# Patient Record
Sex: Female | Born: 1970 | Race: Black or African American | Hispanic: No | Marital: Single | Smoking: Current every day smoker
Health system: Southern US, Community
[De-identification: ages and names within clinical notes are randomized; demographics above are authoritative.]

## PROBLEM LIST (undated history)

## (undated) DIAGNOSIS — F1410 Cocaine abuse, uncomplicated: Secondary | ICD-10-CM

## (undated) DIAGNOSIS — E039 Hypothyroidism, unspecified: Secondary | ICD-10-CM

## (undated) DIAGNOSIS — F32A Depression, unspecified: Secondary | ICD-10-CM

## (undated) DIAGNOSIS — D509 Iron deficiency anemia, unspecified: Secondary | ICD-10-CM

## (undated) DIAGNOSIS — I10 Essential (primary) hypertension: Secondary | ICD-10-CM

## (undated) DIAGNOSIS — F329 Major depressive disorder, single episode, unspecified: Secondary | ICD-10-CM

## (undated) HISTORY — PX: TUBAL LIGATION: SHX77

---
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
## 2000-09-01 ENCOUNTER — Ambulatory Visit (HOSPITAL_COMMUNITY): Admission: RE | Admit: 2000-09-01 | Discharge: 2000-09-01 | Payer: Self-pay | Admitting: *Deleted

## 2000-09-03 ENCOUNTER — Encounter: Admission: RE | Admit: 2000-09-03 | Discharge: 2000-09-03 | Payer: Self-pay | Admitting: Obstetrics & Gynecology

## 2000-09-24 ENCOUNTER — Encounter: Admission: RE | Admit: 2000-09-24 | Discharge: 2000-09-24 | Payer: Self-pay | Admitting: Obstetrics & Gynecology

## 2000-10-10 ENCOUNTER — Inpatient Hospital Stay (HOSPITAL_COMMUNITY): Admission: AD | Admit: 2000-10-10 | Discharge: 2000-10-10 | Payer: Self-pay | Admitting: Obstetrics & Gynecology

## 2000-10-15 ENCOUNTER — Encounter: Admission: RE | Admit: 2000-10-15 | Discharge: 2000-10-15 | Payer: Self-pay | Admitting: Obstetrics & Gynecology

## 2000-10-22 ENCOUNTER — Ambulatory Visit (HOSPITAL_COMMUNITY): Admission: RE | Admit: 2000-10-22 | Discharge: 2000-10-22 | Payer: Self-pay | Admitting: Obstetrics & Gynecology

## 2000-10-29 ENCOUNTER — Encounter: Admission: RE | Admit: 2000-10-29 | Discharge: 2000-10-29 | Payer: Self-pay | Admitting: Obstetrics & Gynecology

## 2000-11-05 ENCOUNTER — Encounter: Admission: RE | Admit: 2000-11-05 | Discharge: 2000-11-05 | Payer: Self-pay | Admitting: Obstetrics & Gynecology

## 2000-11-05 ENCOUNTER — Encounter (HOSPITAL_COMMUNITY): Admission: RE | Admit: 2000-11-05 | Discharge: 2000-12-19 | Payer: Self-pay | Admitting: Obstetrics & Gynecology

## 2000-11-12 ENCOUNTER — Encounter: Admission: RE | Admit: 2000-11-12 | Discharge: 2000-11-12 | Payer: Self-pay | Admitting: Obstetrics & Gynecology

## 2000-11-19 ENCOUNTER — Encounter: Admission: RE | Admit: 2000-11-19 | Discharge: 2000-11-19 | Payer: Self-pay | Admitting: Obstetrics & Gynecology

## 2000-11-26 ENCOUNTER — Encounter: Admission: RE | Admit: 2000-11-26 | Discharge: 2000-11-26 | Payer: Self-pay | Admitting: Obstetrics & Gynecology

## 2000-12-03 ENCOUNTER — Encounter: Admission: RE | Admit: 2000-12-03 | Discharge: 2000-12-03 | Payer: Self-pay | Admitting: Obstetrics & Gynecology

## 2000-12-10 ENCOUNTER — Encounter: Admission: RE | Admit: 2000-12-10 | Discharge: 2000-12-10 | Payer: Self-pay | Admitting: Obstetrics & Gynecology

## 2000-12-17 ENCOUNTER — Encounter: Admission: RE | Admit: 2000-12-17 | Discharge: 2000-12-17 | Payer: Self-pay | Admitting: Obstetrics & Gynecology

## 2000-12-17 ENCOUNTER — Inpatient Hospital Stay (HOSPITAL_COMMUNITY): Admission: AD | Admit: 2000-12-17 | Discharge: 2000-12-20 | Payer: Self-pay | Admitting: *Deleted

## 2003-02-28 ENCOUNTER — Ambulatory Visit (HOSPITAL_COMMUNITY): Admission: RE | Admit: 2003-02-28 | Discharge: 2003-02-28 | Payer: Self-pay | Admitting: Obstetrics and Gynecology

## 2003-08-08 ENCOUNTER — Emergency Department (HOSPITAL_COMMUNITY): Admission: EM | Admit: 2003-08-08 | Discharge: 2003-08-09 | Payer: Self-pay

## 2005-02-01 ENCOUNTER — Emergency Department (HOSPITAL_COMMUNITY): Admission: EM | Admit: 2005-02-01 | Discharge: 2005-02-01 | Payer: Self-pay | Admitting: Emergency Medicine

## 2005-02-04 ENCOUNTER — Ambulatory Visit (HOSPITAL_COMMUNITY): Admission: RE | Admit: 2005-02-04 | Discharge: 2005-02-04 | Payer: Self-pay | Admitting: Emergency Medicine

## 2005-04-15 ENCOUNTER — Emergency Department (HOSPITAL_COMMUNITY): Admission: EM | Admit: 2005-04-15 | Discharge: 2005-04-15 | Payer: Self-pay | Admitting: Emergency Medicine

## 2005-09-12 ENCOUNTER — Emergency Department (HOSPITAL_COMMUNITY): Admission: EM | Admit: 2005-09-12 | Discharge: 2005-09-13 | Payer: Self-pay | Admitting: Emergency Medicine

## 2006-02-09 ENCOUNTER — Emergency Department (HOSPITAL_COMMUNITY): Admission: EM | Admit: 2006-02-09 | Discharge: 2006-02-09 | Payer: Self-pay | Admitting: Emergency Medicine

## 2006-02-25 ENCOUNTER — Emergency Department (HOSPITAL_COMMUNITY): Admission: EM | Admit: 2006-02-25 | Discharge: 2006-02-25 | Payer: Self-pay | Admitting: Emergency Medicine

## 2006-04-10 IMAGING — US US TRANSVAGINAL NON-OB
1 series · 14 of 25 positions shown · non-contrast
Comparison: none

CLINICAL DATA: The patient has abdominal pain.
 ULTRASOUND OF THE PELVIS - TRANSABDOMINAL AND TRANSVAGINAL:
 The transabdominal study reveals the uterus to measure 9.9 x 5.3 x 6.0 cm.  
 The transvaginal study reveals a fluid-filled endometrial canal with the endometrial stripe measuring 2 cm which is enlarged.  The right ovary measures 4.0 x 1.8 x 2.0 cm with follicles.  The left ovary measures 4.0 x 2.5 x 2.9 cm with a 1.4 x 1.5 x 1.8 cm complex cyst of the left ovary.  There is a small amount of free fluid.

[Series 1: unknown · 0.32mm/px · 14 of 67 slices shown]
[im 1/67]
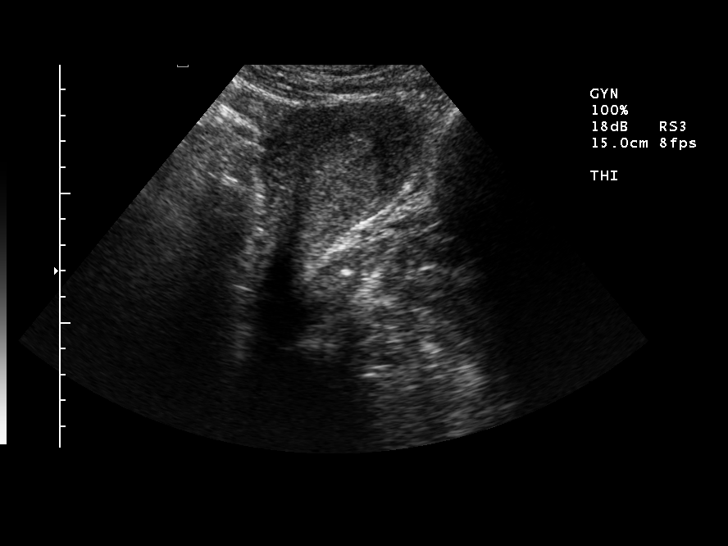
[im 6/67]
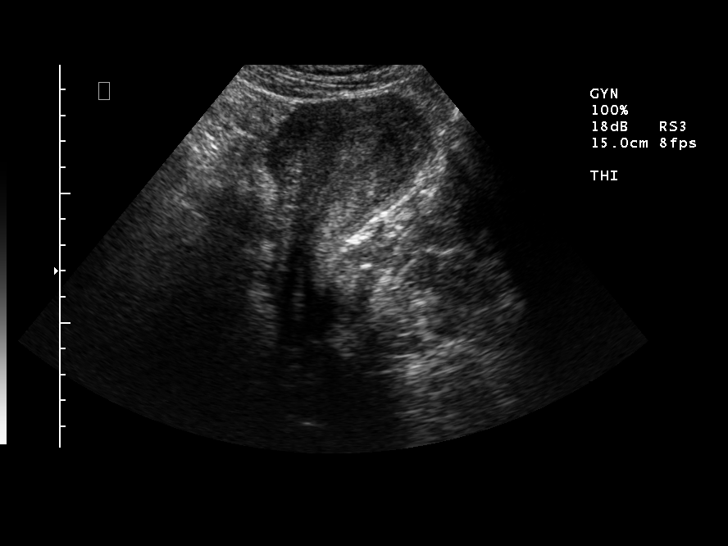
[im 12/67]
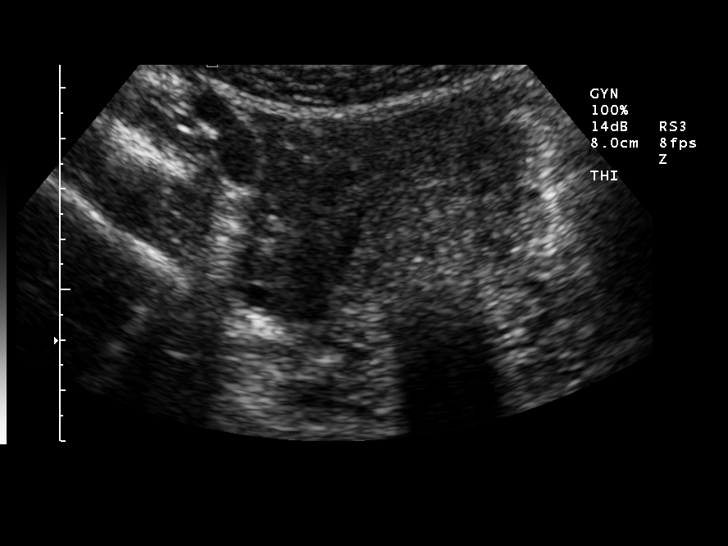
[im 17/67]
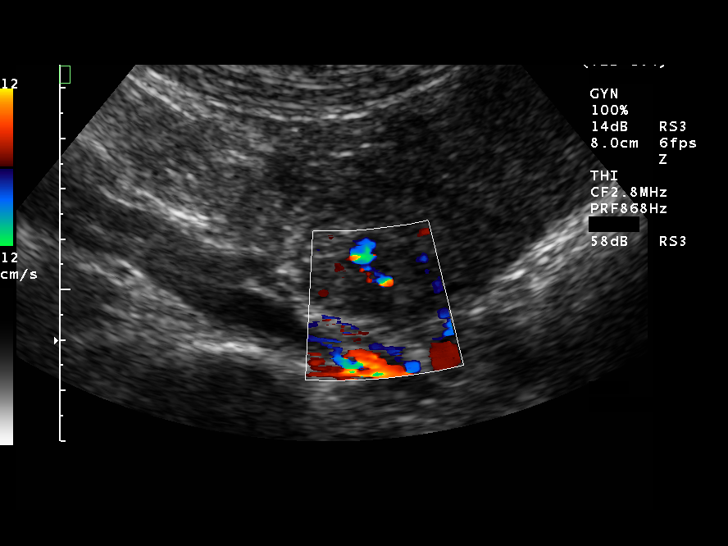
[im 23/67]
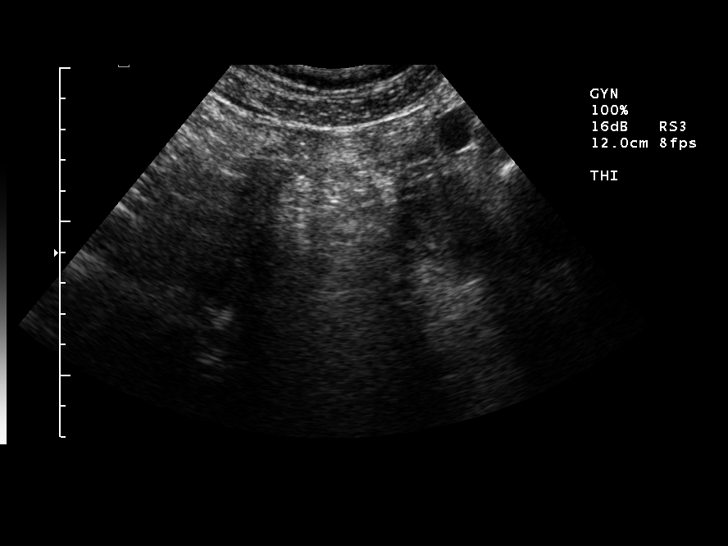
[im 25/67]
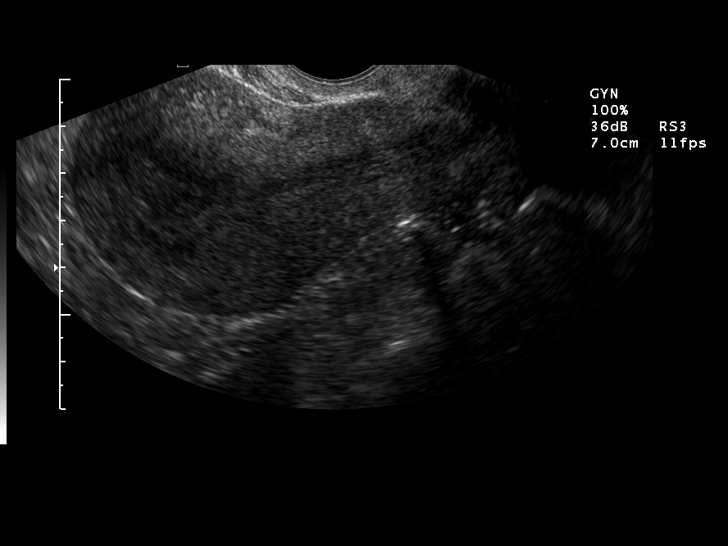
[im 31/67]
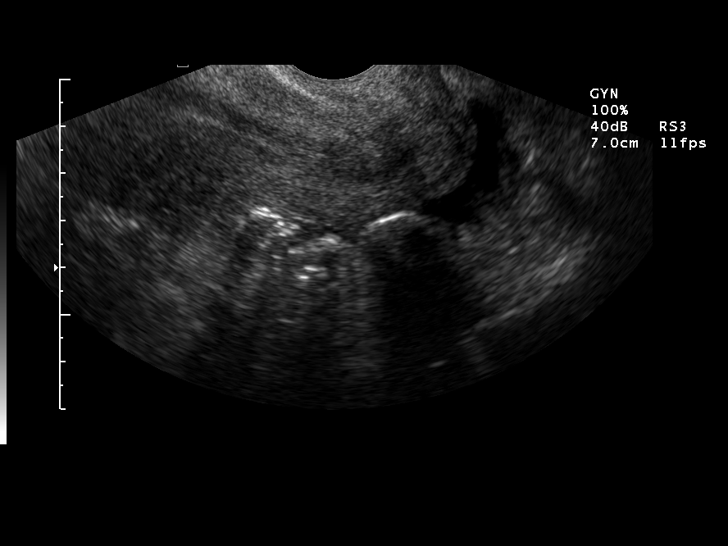
[im 36/67]
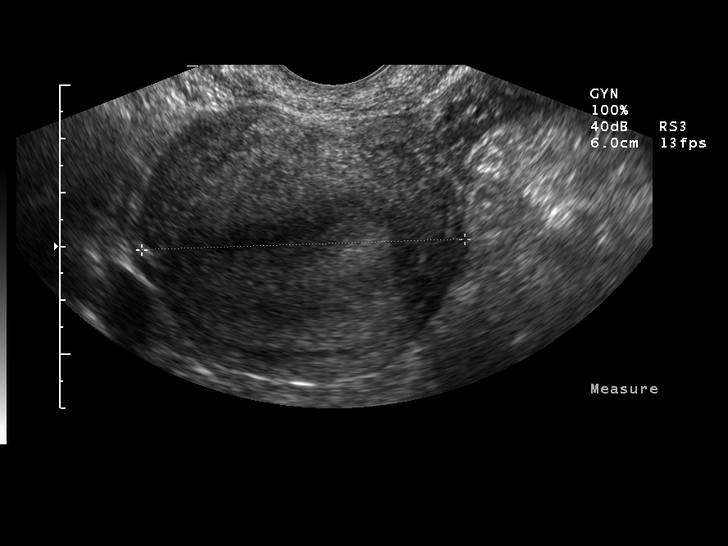
[im 42/67]
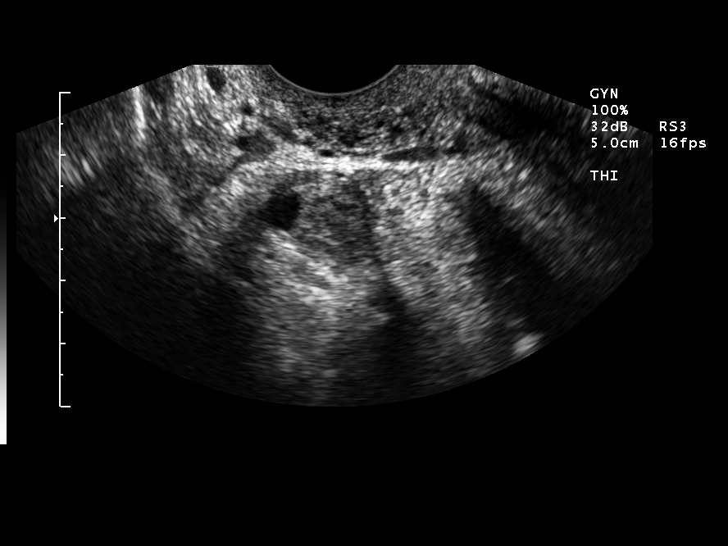
[im 45/67]
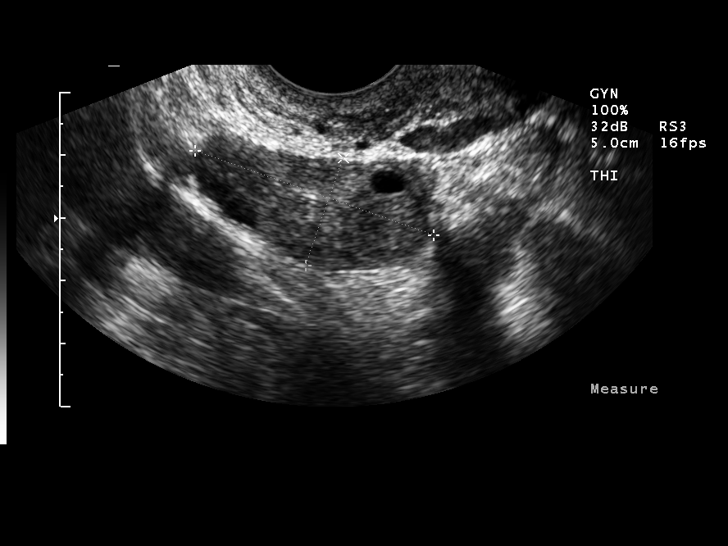
[im 50/67]
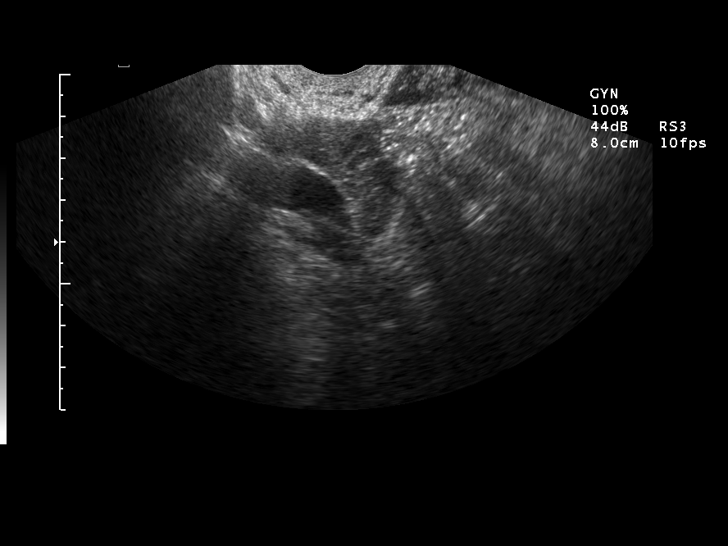
[im 56/67]
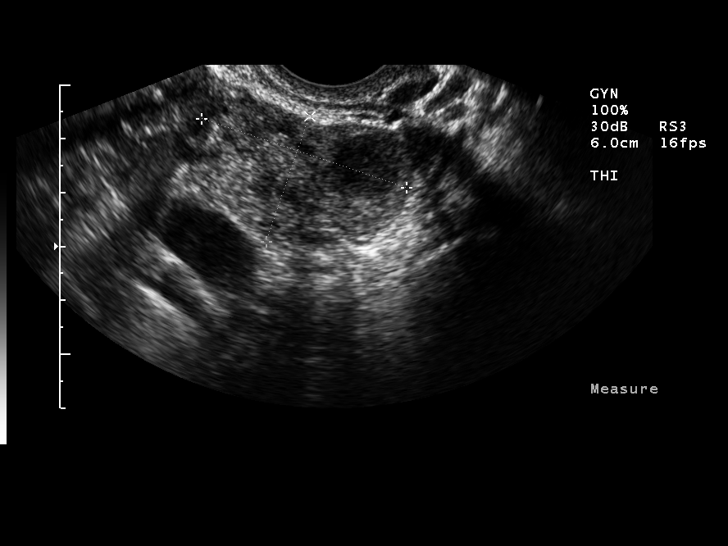
[im 61/67]
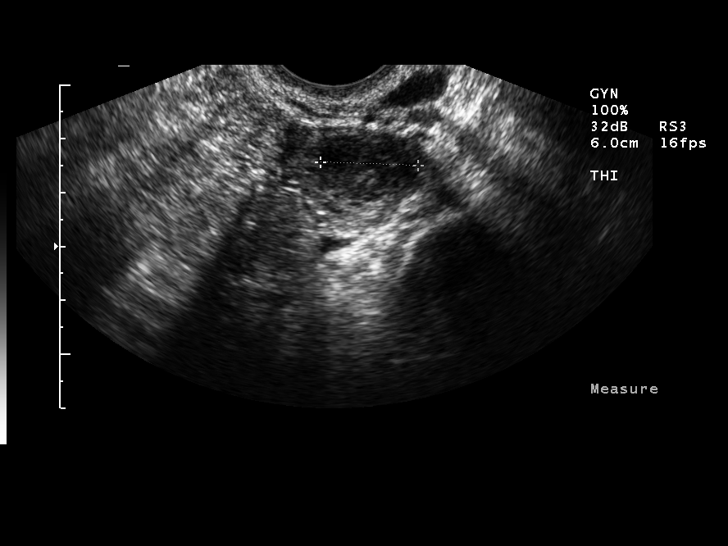
[im 67/67]
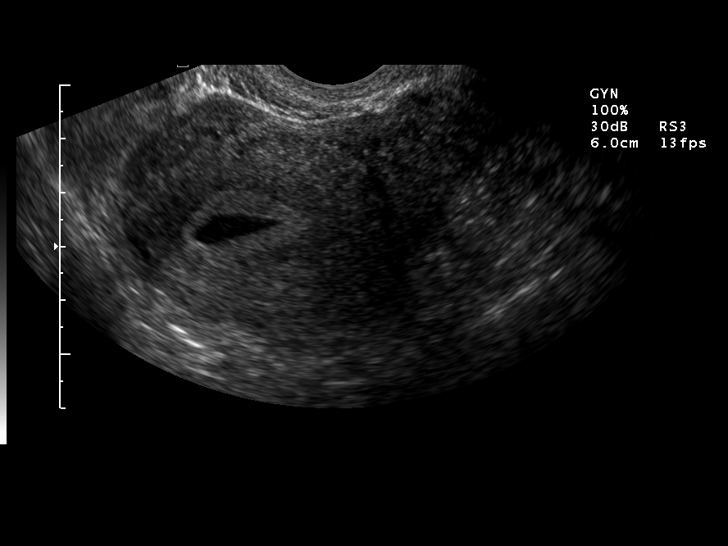

[14 of 25 positions shown; findings below may reference images not displayed]

IMPRESSION: There is a fluid-filled endometrial canal with prominence of the endometrial lining.  
 There is a 1.8 cm complex cyst of the left ovary which could represent an involuted cyst with a small amount of free fluid.

## 2007-05-01 IMAGING — CR DG CHEST 2V
2 series · 2 of 2 positions shown · non-contrast
Comparison: None.

CLINICAL DATA: Medical clearance.  Abdominal pain.
 CHEST - 2 VIEW:

[w chest pa *]
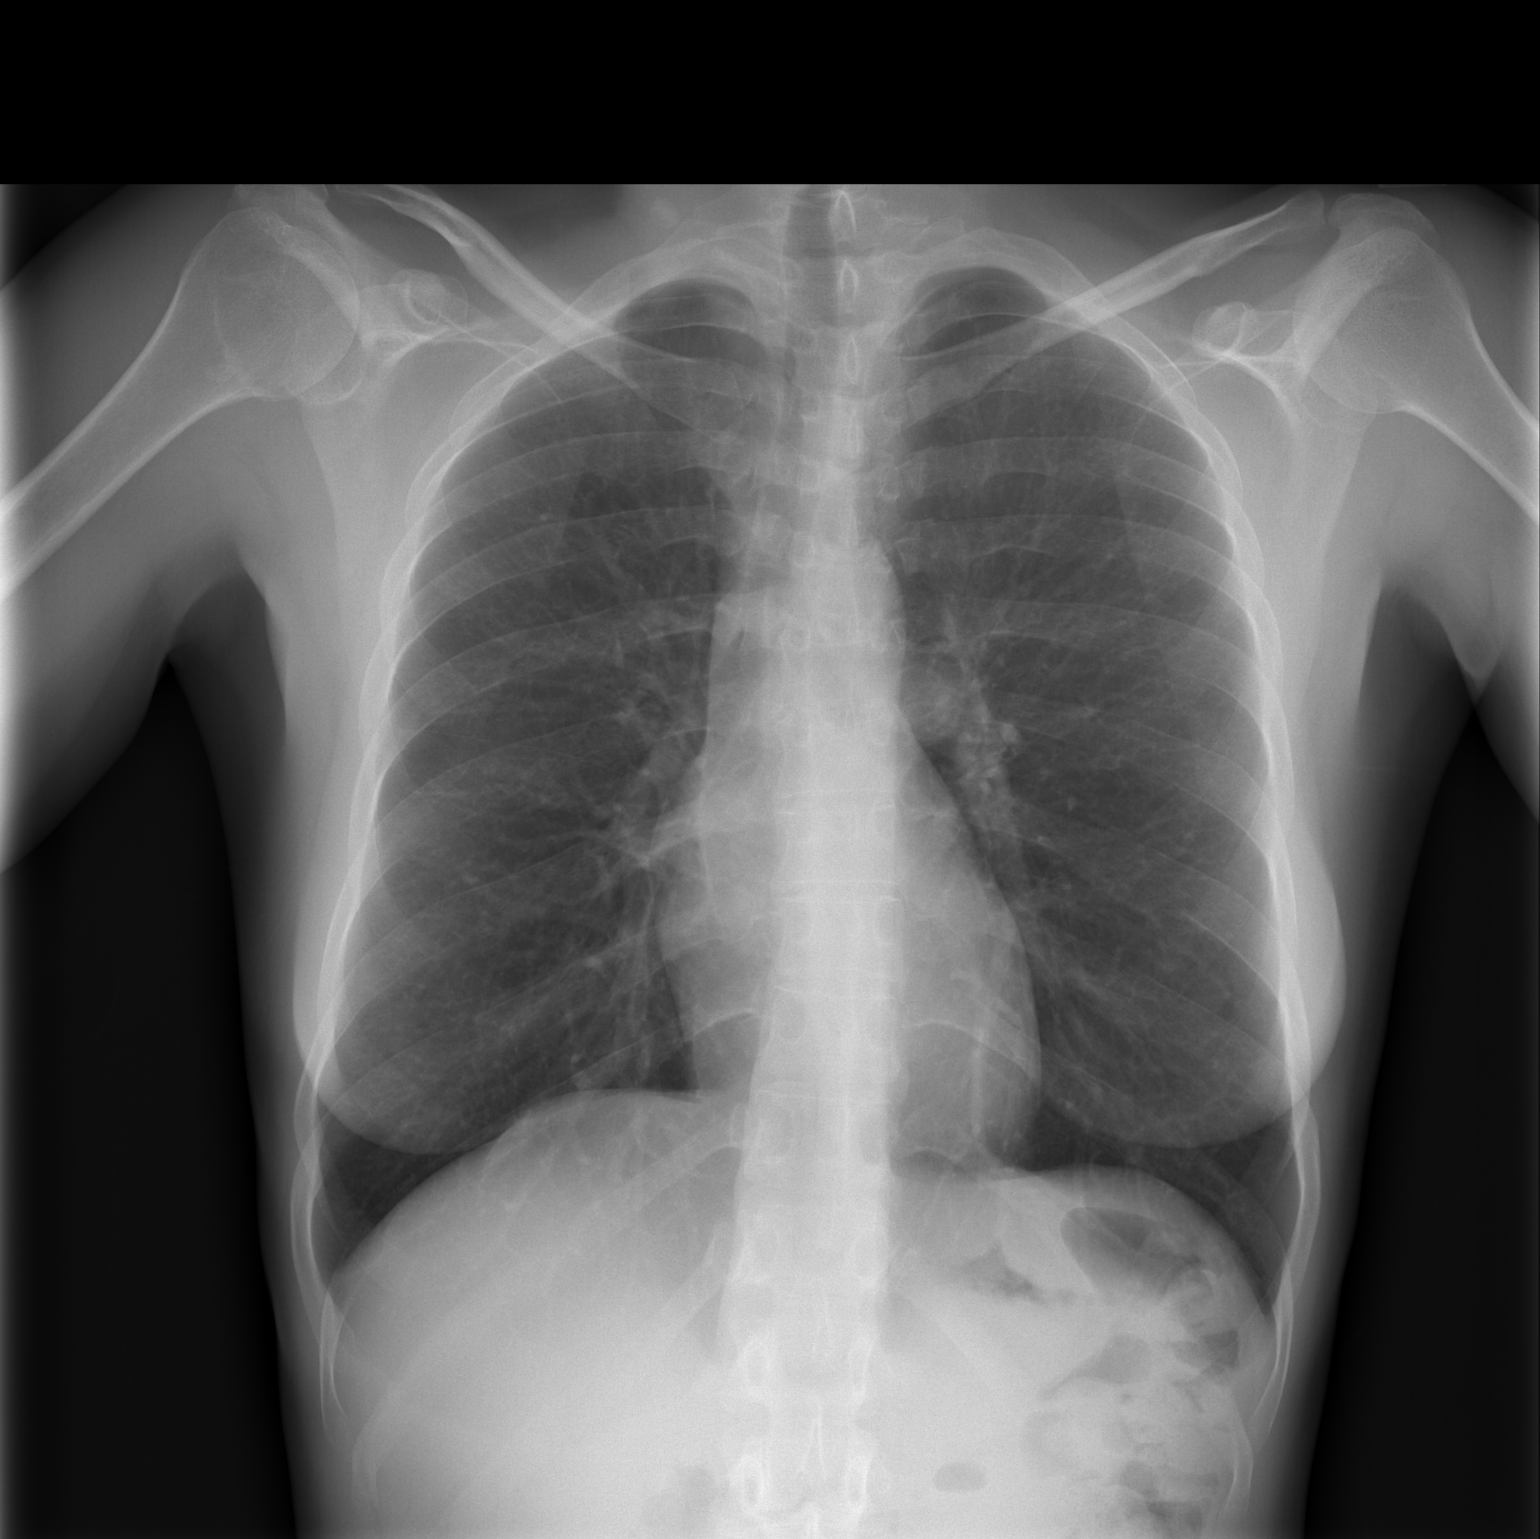

[w chest lat]
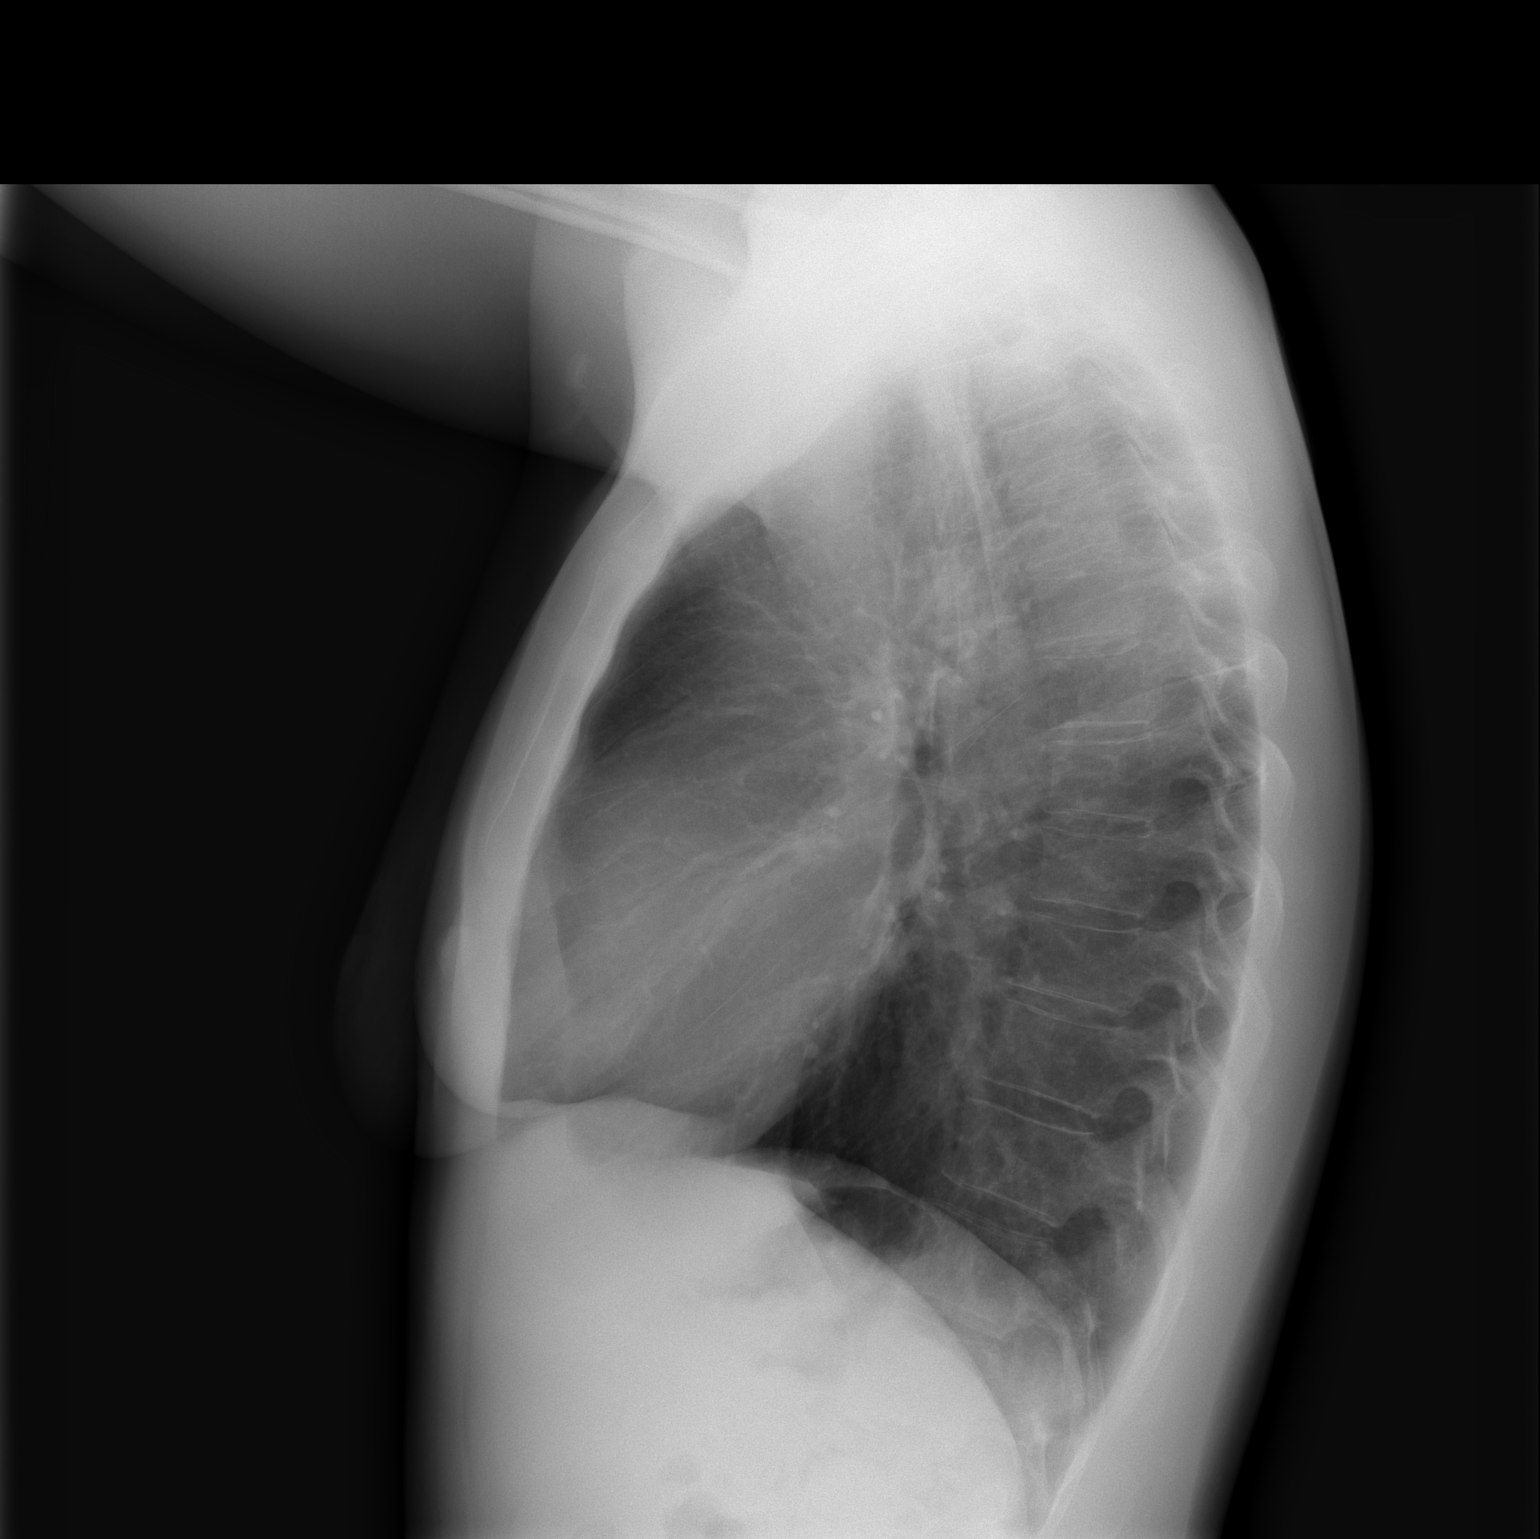

[2 of 2 positions shown; findings below may reference images not displayed]

Heart size is normal.  There is no effusions or edema.  No focal air space opacities are identified.  Review of the visualized osseous structures are unremarkable.
IMPRESSION: No active cardiopulmonary disease.

## 2011-03-13 ENCOUNTER — Encounter: Payer: Self-pay | Admitting: Family Medicine

## 2014-08-27 DIAGNOSIS — Z9851 Tubal ligation status: Secondary | ICD-10-CM | POA: Insufficient documentation

## 2016-02-16 DIAGNOSIS — I10 Essential (primary) hypertension: Secondary | ICD-10-CM | POA: Insufficient documentation

## 2020-06-15 ENCOUNTER — Emergency Department (HOSPITAL_COMMUNITY): Payer: Commercial Managed Care - PPO

## 2020-06-15 ENCOUNTER — Other Ambulatory Visit: Payer: Self-pay

## 2020-06-15 ENCOUNTER — Emergency Department (HOSPITAL_COMMUNITY)
Admission: EM | Admit: 2020-06-15 | Discharge: 2020-06-15 | Disposition: A | Discharge status: Home / Self Care | Payer: Commercial Managed Care - PPO | Attending: Emergency Medicine | Admitting: Emergency Medicine

## 2020-06-15 ENCOUNTER — Encounter (HOSPITAL_COMMUNITY): Payer: Self-pay | Admitting: Emergency Medicine

## 2020-06-15 DIAGNOSIS — Z20822 Contact with and (suspected) exposure to covid-19: Secondary | ICD-10-CM | POA: Diagnosis not present

## 2020-06-15 DIAGNOSIS — F1910 Other psychoactive substance abuse, uncomplicated: Secondary | ICD-10-CM | POA: Diagnosis not present

## 2020-06-15 DIAGNOSIS — F15950 Other stimulant use, unspecified with stimulant-induced psychotic disorder with delusions: Secondary | ICD-10-CM | POA: Insufficient documentation

## 2020-06-15 DIAGNOSIS — F17210 Nicotine dependence, cigarettes, uncomplicated: Secondary | ICD-10-CM | POA: Insufficient documentation

## 2020-06-15 DIAGNOSIS — R443 Hallucinations, unspecified: Secondary | ICD-10-CM | POA: Diagnosis present

## 2020-06-15 DIAGNOSIS — F329 Major depressive disorder, single episode, unspecified: Secondary | ICD-10-CM | POA: Insufficient documentation

## 2020-06-15 LAB — SARS CORONAVIRUS 2 BY RT PCR (HOSPITAL ORDER, PERFORMED IN ~~LOC~~ HOSPITAL LAB): SARS Coronavirus 2: NEGATIVE

## 2020-06-15 LAB — CBC
HCT: 29.6 % — ABNORMAL LOW (ref 36.0–46.0)
Hemoglobin: 9.8 g/dL — ABNORMAL LOW (ref 12.0–15.0)
MCH: 26.1 pg (ref 26.0–34.0)
MCHC: 33.1 g/dL (ref 30.0–36.0)
MCV: 78.7 fL — ABNORMAL LOW (ref 80.0–100.0)
Platelets: 372 10*3/uL (ref 150–400)
RBC: 3.76 MIL/uL — ABNORMAL LOW (ref 3.87–5.11)
RDW: 15.5 % (ref 11.5–15.5)
WBC: 22.4 10*3/uL — ABNORMAL HIGH (ref 4.0–10.5)
nRBC: 0 % (ref 0.0–0.2)

## 2020-06-15 LAB — URINALYSIS, ROUTINE W REFLEX MICROSCOPIC
Bilirubin Urine: NEGATIVE
Glucose, UA: NEGATIVE mg/dL
Hgb urine dipstick: NEGATIVE
Ketones, ur: 20 mg/dL — AB
Leukocytes,Ua: NEGATIVE
Nitrite: NEGATIVE
Protein, ur: NEGATIVE mg/dL
Specific Gravity, Urine: 1.008 (ref 1.005–1.030)
pH: 5 (ref 5.0–8.0)

## 2020-06-15 LAB — RAPID URINE DRUG SCREEN, HOSP PERFORMED
Amphetamines: NOT DETECTED
Barbiturates: NOT DETECTED
Benzodiazepines: NOT DETECTED
Cocaine: POSITIVE — AB
Opiates: NOT DETECTED
Tetrahydrocannabinol: NOT DETECTED

## 2020-06-15 LAB — COMPREHENSIVE METABOLIC PANEL
ALT: 20 U/L (ref 0–44)
AST: 26 U/L (ref 15–41)
Albumin: 4.3 g/dL (ref 3.5–5.0)
Alkaline Phosphatase: 87 U/L (ref 38–126)
Anion gap: 12 (ref 5–15)
BUN: 11 mg/dL (ref 6–20)
CO2: 19 mmol/L — ABNORMAL LOW (ref 22–32)
Calcium: 9 mg/dL (ref 8.9–10.3)
Chloride: 101 mmol/L (ref 98–111)
Creatinine, Ser: 0.95 mg/dL (ref 0.44–1.00)
GFR calc Af Amer: >60 mL/min (ref >60)
GFR calc non Af Amer: >60 mL/min (ref >60)
Glucose, Bld: 107 mg/dL — ABNORMAL HIGH (ref 70–99)
Potassium: 3.8 mmol/L (ref 3.5–5.1)
Sodium: 132 mmol/L — ABNORMAL LOW (ref 135–145)
Total Bilirubin: 0.5 mg/dL (ref 0.3–1.2)
Total Protein: 7.9 g/dL (ref 6.5–8.1)

## 2020-06-15 LAB — SALICYLATE LEVEL: Salicylate Lvl: <7 mg/dL — ABNORMAL LOW (ref 7.0–30.0)

## 2020-06-15 LAB — HCG, QUANTITATIVE, PREGNANCY: hCG, Beta Chain, Quant, S: <1 m[IU]/mL (ref <5)

## 2020-06-15 LAB — ETHANOL: Alcohol, Ethyl (B): <10 mg/dL (ref <10)

## 2020-06-15 LAB — ACETAMINOPHEN LEVEL: Acetaminophen (Tylenol), Serum: <10 ug/mL — ABNORMAL LOW (ref 10–30)

## 2020-06-15 MED ORDER — THIAMINE HCL 100 MG PO TABS: 100.0000 mg | ORAL_TABLET | Freq: Every day | ORAL | Status: DC

## 2020-06-15 MED ORDER — LORAZEPAM 1 MG PO TABS: 0.0000 mg | ORAL_TABLET | Freq: Two times a day (BID) | ORAL | Status: DC

## 2020-06-15 MED ORDER — LORAZEPAM 2 MG/ML IJ SOLN: 0.0000 mg | Freq: Four times a day (QID) | INTRAMUSCULAR | Status: DC

## 2020-06-15 MED ORDER — LORAZEPAM 1 MG PO TABS: 0.0000 mg | ORAL_TABLET | Freq: Four times a day (QID) | ORAL | Status: DC

## 2020-06-15 MED ORDER — IBUPROFEN 200 MG PO TABS
600.0000 mg | ORAL_TABLET | Freq: Once | ORAL | Status: AC
  Administered 2020-06-15: 600 mg via ORAL
  Filled 2020-06-15: qty 3

## 2020-06-15 MED ORDER — LACTATED RINGERS IV BOLUS
1000.0000 mL | Freq: Once | INTRAVENOUS | Status: DC
Start: 2020-06-15 — End: 2020-06-15

## 2020-06-15 MED ORDER — LORAZEPAM 2 MG/ML IJ SOLN: 0.0000 mg | Freq: Two times a day (BID) | INTRAMUSCULAR | Status: DC

## 2020-06-15 MED ORDER — THIAMINE HCL 100 MG/ML IJ SOLN: 100.0000 mg | Freq: Every day | INTRAMUSCULAR | Status: DC

## 2020-06-15 NOTE — ED Provider Notes (Signed)
[facility] DEPT Provider Note   CSN: [id number] Arrival date & time: 06/15/20  0127     History Chief Complaint  Patient presents with  . Hallucinations    Marie Atkins is a 49 y.o. female.  Patient to ED for evaluation of hallucinations and paranoia after relapsing on drugs last night. She states she was told she was being given crack cocaine but that around midnight when she smoked the substance she immediately felt bugs crawling on her skin, seeing snakes surrounding her, she remembers trying to jump out of a window to get away from what she was seeing. She felt she was being restrained by people that were not there. She is tearful, emotionally distraught and that she still does not know what is real. She is still feeling significantly paranoid but is not seeing snake or feeling bugs on her skin. She is depressed regarding her relapse and is distraught over the harm it will cause her children. She is not comfortable with discharge despite not actively hallucinating, and feels she needs to talk to psychiatry.   The history is provided by the patient. No language interpreter was used.       History reviewed. No pertinent past medical history.  There are no problems to display for this patient.   History reviewed. No pertinent surgical history.   OB History   No obstetric history on file.     History reviewed. No pertinent family history.  Social History   Tobacco Use  . Smoking status: Current Every Day Smoker    Types: Cigarettes  . Smokeless tobacco: Never Used  Vaping Use  . Vaping Use: Never used  Substance Use Topics  . Alcohol use: Yes  . Drug use: Yes    Types: Cocaine    Home Medications Prior to Admission medications   Not on File    Allergies    Ace inhibitors  Review of Systems   Review of Systems  Constitutional: Negative for chills and fever.  HENT: Negative.   Respiratory: Negative.   Cardiovascular:  Negative.   Gastrointestinal: Negative.   Musculoskeletal: Negative.   Skin: Negative.   Neurological: Negative.   Psychiatric/Behavioral: Positive for confusion, dysphoric mood and hallucinations. The patient is nervous/anxious.     Physical Exam Updated Vital Signs BP (!) 142/96   Pulse (!) 109   Temp 98.6 F (37 C) (Oral)   Resp 16   LMP 06/01/2020   SpO2 100%   Physical Exam Vitals and nursing note reviewed.  Constitutional:      Appearance: She is well-developed.  HENT:     Head: Normocephalic.  Cardiovascular:     Rate and Rhythm: Normal rate and regular rhythm.  Pulmonary:     Effort: Pulmonary effort is normal.     Breath sounds: Normal breath sounds.  Abdominal:     General: Bowel sounds are normal.     Palpations: Abdomen is soft.     Tenderness: There is no abdominal tenderness. There is no guarding or rebound.  Musculoskeletal:        General: Normal range of motion.     Cervical back: Normal range of motion and neck supple.  Skin:    General: Skin is warm and dry.     Findings: No rash.  Neurological:     Mental Status: She is alert and oriented to person, place, and time.  Psychiatric:        Mood and Affect: Mood is depressed. Affect  is tearful.        Behavior: Behavior is slowed.        Thought Content: Thought content is paranoid.     ED Results / Procedures / Treatments   Labs (all labs ordered are listed, but only abnormal results are displayed) Labs Reviewed  COMPREHENSIVE METABOLIC PANEL - Abnormal; Notable for the following components:      Result Value   Sodium 132 (*)    CO2 19 (*)    Glucose, Bld 107 (*)    All other components within normal limits  SALICYLATE LEVEL - Abnormal; Notable for the following components:   Salicylate Lvl <5.6 (*)    All other components within normal limits  ACETAMINOPHEN LEVEL - Abnormal; Notable for the following components:   Acetaminophen (Tylenol), Serum <10 (*)    All other components within  normal limits  CBC - Abnormal; Notable for the following components:   WBC 22.4 (*)    RBC 3.76 (*)    Hemoglobin 9.8 (*)    HCT 29.6 (*)    MCV 78.7 (*)    All other components within normal limits  RAPID URINE DRUG SCREEN, HOSP PERFORMED - Abnormal; Notable for the following components:   Cocaine POSITIVE (*)    All other components within normal limits  ETHANOL  URINALYSIS, ROUTINE W REFLEX MICROSCOPIC  I-STAT BETA HCG BLOOD, ED ([facility], [facility], [facility] ONLY)    EKG EKG Interpretation  Date/Time:  Thursday June 15 2020 01:41:28 EDT Ventricular Rate:  111 PR Interval:    QRS Duration: 92 QT Interval:  336 QTC Calculation: 457 R Axis:   86 Text Interpretation: Sinus tachycardia LAE, consider biatrial enlargement Confirmed by Randal Buba, April ([id number]) on 06/15/2020 6:26:19 AM   Radiology No results found.  Procedures Procedures (including critical care time)  Medications Ordered in ED Medications - No data to display  ED Course  I have reviewed the triage vital signs and the nursing notes.  Pertinent labs & imaging results that were available during my care of the patient were reviewed by me and considered in my medical decision making (see chart for details).    MDM Rules/Calculators/A&P                          Patient to ED with ss/sxs as per HPI.   She does not appear to be hallucinating but is still paranoid, tearful and fearful. Will request TTS consultation for evaluation and outpatient resources if discharged.   She has a significant leukocytosis of 22.4. Mild anemia. Hemodynamically stable.  Will add UA and CXR to exclude concern for active infection. Pregnancy pending as well for review.    Final Clinical Impression(s) / ED Diagnoses Final diagnoses:  None   1. Hallucinations 2. Paranoia 3. Substance abuse 4. Depression   Rx / DC Orders ED Discharge Orders    None       Dennie Bible 06/15/20 Rives, April, MD 06/15/20 [phone]

## 2020-06-15 NOTE — Patient Outreach (Signed)
ED Peer Support Specialist Patient Intake (Complete at intake & 30-60 Day Follow-up)  Name: Marie Atkins  MRN: [id number]  Age: 49 y.o.   Date of Admission: 06/15/2020  Intake: Initial Comments:      Primary Reason Admitted: Hallucinations, Drug Problem   Lab values: Alcohol/ETOH: Negative Positive UDS? Yes Amphetamines: No Barbiturates: No Benzodiazepines: No Cocaine: Yes Opiates: No Cannabinoids: No  Demographic information: Gender: Female Ethnicity: African American Marital Status: Single Insurance Status: Diplomatic Services operational officer (Work Neurosurgeon, Physicist, medical, etc.: No Lives with: Alone Living situation: House/Apartment  Reported Patient History: Patient reported health conditions: None Patient aware of HIV and hepatitis status: No  In past year, has patient visited ED for any reason? No  Number of ED visits:    Reason(s) for visit:    In past year, has patient been hospitalized for any reason? No  Number of hospitalizations:    Reason(s) for hospitalization:    In past year, has patient been arrested? No  Number of arrests:    Reason(s) for arrest:    In past year, has patient been incarcerated? No  Number of incarcerations:    Reason(s) for incarceration:    In past year, has patient received medication-assisted treatment? No  In past year, patient received the following treatments:    In past year, has patient received any harm reduction services? No  Did this include any of the following?    In past year, has patient received care from a mental health provider for diagnosis other than SUD? No  In past year, is this first time patient has overdosed? No  Number of past overdoses:    In past year, is this first time patient has been hospitalized for an overdose? No  Number of hospitalizations for overdose(s):    Is patient currently receiving treatment for a mental health diagnosis? No  Patient reports  experiencing difficulty participating in SUD treatment: No    Most important reason(s) for this difficulty?    Has patient received prior services for treatment? No  In past, patient has received services from following agencies:    Plan of Care:  Suggested follow up at these agencies/treatment centers: Other (comment)  Other information: CPSS met with Pt an was able to gain information to better assist Pt. Pt stated that she wants information for resources for outpatient services. CPSS processed with Pt about several facilities that Pt can benefit from. CPSS issued contact information for Pt, for if she needs support in community.      Aaron Edelman Mahmoud Blazejewski, CPSS  06/15/2020 11:43 AM

## 2020-06-15 NOTE — BH Assessment (Addendum)
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  Patient states that she recently relapsed.  Patient states that she did not want her family to know that she had relapsed, so she used with people that she was unfamiliar with.  Patient states that she was given a drug that she did not know what it was.  Patient states that she was smoking crack cocaine, but by her report sounds more like meth-amphetamine.  Patient states that she became very paranoid, she has tactile hallucinations of bugs crawling on her, she states that she took her clothes off and did things that she would normally never do. Patient states that her heart was racing and she experienced chest pain.  She states that this experience scared her to death.  Patient came to the ED to be examined and it was essentially concluded that her symptoms were drug induced and after she came down from the drug, everything essentially normalized for her and she was no longer paranoid or hallucinations.  Patient wanted to be seen by TTS for SA treatment resources.  Patient denies SI/HI/Psychosis.  She states that she does not want to hurt herself or anyone else.  Patient states that she just wanted to talk to someone about this experience because the situation scared her so much.  Patient is currently alert and oriented, her thoughts organized and her memory intact.  She does not appear to be responding to any internal stimuli.  Her mood is somewhat depressed due to her substance use and relapse.  Patient's current level of judgment, insight and impulse control are impaired due to her drug use, but is normally okay when she is not using.   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 Self   Referral name: No data recorded  Referral phone number: No data  recorded Whom do you see for routine medical problems? Other (Comment) Carolyne Littles, [place])   Practice/Facility Name: No data recorded  Practice/Facility Phone Number: No data recorded  Name of Contact: No data recorded  Contact Number: No data recorded  Contact Fax Number: No data recorded  Prescriber Name: No data recorded  Prescriber Address (if known): No data recorded What Is the Reason for Your Visit/Call Today? 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  Last night, she states that she used a substance that made her hallucinate and it scared her and made her paranoid.  Patient states that she came to the ED to get checked out.  How Long Has This Been Causing You Problems? 1-6 months  Have You Recently Been in Any Inpatient Treatment (Hospital/Detox/Crisis Center/28-Day Program)? No   Name/Location of Program/Hospital:No data recorded  How Long Were You There? No data recorded  When Were You Discharged? No data recorded Have You Ever Received Services From [facility] Before? Yes   Who Do You See at [facility]? patient has been seen in the ED before  Have You Recently Had Any Thoughts About Hurting Yourself? No   Are You Planning to Commit Suicide/Harm Yourself At This time?  No  Have you Recently Had Thoughts About [place]? No   Explanation: No data recorded Have You Used Any Alcohol or Drugs in the Past 24 Hours? Yes   How Long Ago Did You Use Drugs  or Alcohol?  No data recorded  What Did You Use and How Much? Patient is unsure of the amount that she used  What Do You Feel Would Help You the Most Today? Other (Comment) (patient states that she would like to get into a SA-IOP Program)  Do You Currently Have a Therapist/Psychiatrist? No   Name of Therapist/Psychiatrist: No data recorded  Have You Been Recently Discharged From Any Office Practice or Programs? No   Explanation of Discharge From  Practice/Program:  No data recorded    CCA Screening Triage Referral Assessment Type of Contact: Tele-Assessment   Is this Initial or Reassessment? Initial Assessment   Date Telepsych consult ordered in [facility]:  06/15/20   Time Telepsych consult ordered in [facility]:  0900  Patient Reported Information Reviewed? No data recorded  Patient Left Without Being Seen? No data recorded  Reason for Not Completing Assessment: No data recorded Collateral Involvement: no collateral information available  Does Patient Have a Court Appointed Legal Guardian? No data recorded  Name and Contact of Legal Guardian:  No data recorded If Minor and Not Living with Parent(s), Who has Custody? No data recorded Is CPS involved or ever been involved? Never  Is APS involved or ever been involved? Never  Patient Determined To Be At Risk for Harm To Self or Others Based on Review of Patient Reported Information or Presenting Complaint? No   Method: No data recorded  Availability of Means: No data recorded  Intent: No data recorded  Notification Required: No data recorded  Additional Information for Danger to Others Potential:  No data recorded  Additional Comments for Danger to Others Potential:  No data recorded  Are There Guns or Other Weapons in Your Home?  No data recorded   Types of Guns/Weapons: No data recorded   Are These Weapons Safely Secured?                              No data recorded   Who Could Verify You Are Able To Have These Secured:    No data recorded Do You Have any Outstanding Charges, Pending Court Dates, Parole/Probation? No data recorded Contacted To Inform of Risk of Harm To Self or Others: No data recorded Location of Assessment: [facility]  Does Patient Present under Involuntary Commitment? No   IVC Papers Initial File Date: No data recorded  [place] of Residence: [place]  Patient Currently Receiving the Following Services: Not Receiving Services   Determination of Need: Routine  (7 days)   Options For Referral: Chemical Dependency Intensive Outpatient Therapy (CDIOP)  Disposition:  Patient has been psych cleared by Letitia Libra, NP,  for follow-up with an OP substance abuse program.  TTS/Tom Ysidro Evert to provide her with resources.   Aneesh Faller J Ambrie Carte, LCAS

## 2020-06-15 NOTE — ED Triage Notes (Signed)
Patient is seeing visual hallucinations after using cocaine. Patient also has ETOH on board.

## 2020-06-15 NOTE — ED Notes (Signed)
Pt currently talking to TTS

## 2020-06-15 NOTE — Discharge Instructions (Addendum)
Please follow-up with some of the provided resources for support with substance abuse.  Should you begin to have thoughts of suicide or self-harm, please return to the emergency department or call 911.  Your white blood cell count was higher than normal today.  This should be retested by a primary care provider within the next couple weeks.  To help you maintain a sober lifestyle, a substance abuse treatment program may be beneficial to you.  [facility] offers a Chemical Dependency Intensive Outpatient Program.  The program meets several hours a day, several times a week.  They offer programs in the morning, afternoon and evening.  Contact them at your earliest opportunity to ask about enrolling:       [facility]      [place], [place] [address]      [phone]

## 2020-06-15 NOTE — BH Assessment (Signed)
[facility] Assessment Progress Note  Per Letitia Libra, FNP, this pt does not require psychiatric hospitalization at this time.  Pt is to be discharged from [facility] with outpatient substance abuse treatment resources.  This Probation officer spoke to pt about the Chemical Dependency Intensive Outpatient Program (CDIOP) offered at the [facility].  Unfortunately, the timing of the program conflicts with her work hours.  Referral information for [facility], which also offers CDIOP, has been included in pt's discharge instructions.  Pt would also benefit from seeing Peer Support Specialists, and a peer support consult has been ordered for pt.  Pt's nurse, Lilia Pro, has been notified.  Jalene Mullet, [place] Triage Specialist [phone]

## 2020-06-15 NOTE — ED Provider Notes (Signed)
Marie Atkins is a 49 y.o. female, presenting to the ED following use of crack cocaine last night.  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  She suspects her hallucinations are due to her drug use. Upon my interview with her, she is quite upset about her relapse, but hallucinations resolved and have not recurred. She denies SI/HI.   HPI from Charlann Lange, PA-C: "Patient to ED for evaluation of hallucinations and paranoia after relapsing on drugs last night. She states she was told she was being given crack cocaine but that around midnight when she smoked the substance she immediately felt bugs crawling on her skin, seeing snakes surrounding her, she remembers trying to jump out of a window to get away from what she was seeing. She felt she was being restrained by people that were not there. She is tearful, emotionally distraught and that she still does not know what is real. She is still feeling significantly paranoid but is not seeing snake or feeling bugs on her skin. She is depressed regarding her relapse and is distraught over the harm it will cause her children. She is not comfortable with discharge despite not actively hallucinating, and feels she needs to talk to psychiatry."  History reviewed. No pertinent past medical history.   Physical Exam  BP (!) 142/96   Pulse (!) 109   Temp 98.6 F (37 C) (Oral)   Resp 16   LMP 06/01/2020   SpO2 100%   Physical Exam Vitals and nursing note reviewed.  Constitutional:      General: She is not in acute distress.    Appearance: She is well-developed. She is not diaphoretic.  HENT:     Head: Normocephalic and atraumatic.     Mouth/Throat:     Mouth: Mucous membranes are moist.     Pharynx: Oropharynx is clear.  Eyes:     Conjunctiva/sclera: Conjunctivae normal.  Cardiovascular:     Rate and Rhythm: Normal rate and regular rhythm.     Pulses: Normal pulses.          Radial pulses are 2+ on the right side and  2+ on the left side.       Posterior tibial pulses are 2+ on the right side and 2+ on the left side.     Heart sounds: Normal heart sounds.     Comments: Not tachycardic upon my evaluation. Pulmonary:     Effort: Pulmonary effort is normal. No respiratory distress.     Breath sounds: Normal breath sounds.  Abdominal:     Palpations: Abdomen is soft.     Tenderness: There is no abdominal tenderness. There is no guarding.  Musculoskeletal:     Cervical back: Neck supple.     Right lower leg: No edema.     Left lower leg: No edema.  Skin:    General: Skin is warm and dry.  Neurological:     Mental Status: She is alert.  Psychiatric:        Mood and Affect: Mood is anxious. Affect is labile and tearful.        Speech: Speech normal.     ED Course/Procedures     Procedures  Abnormal Labs Reviewed  COMPREHENSIVE METABOLIC PANEL - Abnormal; Notable for the following components:      Result Value   Sodium 132 (*)    CO2 19 (*)    Glucose, Bld 107 (*)    All other components within  normal limits  SALICYLATE LEVEL - Abnormal; Notable for the following components:   Salicylate Lvl <3.5 (*)    All other components within normal limits  ACETAMINOPHEN LEVEL - Abnormal; Notable for the following components:   Acetaminophen (Tylenol), Serum <10 (*)    All other components within normal limits  CBC - Abnormal; Notable for the following components:   WBC 22.4 (*)    RBC 3.76 (*)    Hemoglobin 9.8 (*)    HCT 29.6 (*)    MCV 78.7 (*)    All other components within normal limits  RAPID URINE DRUG SCREEN, HOSP PERFORMED - Abnormal; Notable for the following components:   Cocaine POSITIVE (*)    All other components within normal limits  URINALYSIS, ROUTINE W REFLEX MICROSCOPIC - Abnormal; Notable for the following components:   Color, Urine STRAW (*)    Ketones, ur 20 (*)    All other components within normal limits    DG Chest Portable 1 View  Result Date: 06/15/2020 CLINICAL  DATA:  Medical clearance.  Hallucinations after cocaine use EXAM: PORTABLE CHEST 1 VIEW COMPARISON:  02/26/2006 FINDINGS: Normal heart size and mediastinal contours. No acute infiltrate or edema. No effusion or pneumothorax. No acute osseous findings. IMPRESSION: Negative chest. Electronically Signed   By: Monte Fantasia M.D.   On: 06/15/2020 06:48     EKG Interpretation  Date/Time:  Thursday June 15 2020 01:41:28 EDT Ventricular Rate:  111 PR Interval:    QRS Duration: 92 QT Interval:  336 QTC Calculation: 457 R Axis:   86 Text Interpretation: Sinus tachycardia LAE, consider biatrial enlargement Confirmed by Randal Buba, April ([id number]) on 06/15/2020 6:26:19 AM      MDM   Clinical Course as of Jun 15 1050  Thu Jun 15, 2020  1010 Patient was again reassessed.  Pulse continues to be normal.  Denies hallucinations, SI, HI.  She is no longer tearful.  No apparent distress. Endorses a mild headache consistent with previous headaches.  No focal neurologic deficits upon reexamination.   [SJ]    Clinical Course User Index [SJ] Lorayne Bender, PA-C   Patient care handoff report received from Charlann Lange, [place]. Plan: Review UA.  Follow-up on TTS recommendations.   Patient presented with hallucinations following relapse use of cocaine. She had no hallucinations upon my interview with her.  She also denied SI multiple times while under my care.  She states she is just upset that she was clean for so long and recently relapsed Initially tachycardic, which I suspect is a combination of her drug use, the fact that she was upset about her drug use, and perhaps her dehydration. She was given options for rehydration and ultimately chose oral hydration.  Leukocytosis noted.  Suspect this may be due to her drug use and her stressful event last night.  She does not have complaints or findings on her work-up today to suggest infection. Patient is nontoxic appearing, afebrile, not tachypneic, not  hypotensive, maintains excellent SPO2 on room air, and is in no apparent distress.   I have reviewed the patient's chart to obtain more information.   I reviewed and interpreted the patient's labs and radiological studies. Peer-reviewed consult placed.  Patient given information for outpatient resources. Evaluated and cleared by psych team. The patient was given instructions for home care as well as return precautions. Patient voices understanding of these instructions, accepts the plan, and is comfortable with discharge.   Findings and plan of care discussed with  Lajean Saver, MD.   Vitals:   06/15/20 0131 06/15/20 0551 06/15/20 0820  BP: 114/72 (!) 142/96 126/87  Pulse: (!) 113 (!) 109 98  Resp: 16 16 18   Temp: 98.6 F (37 C)    TempSrc: Oral    SpO2: 97% 100% 100%        Lorayne Bender, PA-C 06/15/20 1058    Lajean Saver, MD 06/15/20 1240

## 2020-06-16 ENCOUNTER — Other Ambulatory Visit: Payer: Self-pay

## 2020-06-16 ENCOUNTER — Encounter: Payer: Self-pay | Admitting: Student

## 2020-06-16 ENCOUNTER — Ambulatory Visit: Payer: Commercial Managed Care - PPO | Admitting: Student

## 2020-06-16 VITALS — BP 130/72 | HR 93 | Temp 98.3°F | Ht 64.0 in | Wt 182.6 lb

## 2020-06-16 DIAGNOSIS — F329 Major depressive disorder, single episode, unspecified: Secondary | ICD-10-CM

## 2020-06-16 DIAGNOSIS — N920 Excessive and frequent menstruation with regular cycle: Secondary | ICD-10-CM | POA: Insufficient documentation

## 2020-06-16 DIAGNOSIS — D259 Leiomyoma of uterus, unspecified: Secondary | ICD-10-CM | POA: Diagnosis not present

## 2020-06-16 DIAGNOSIS — F32A Depression, unspecified: Secondary | ICD-10-CM

## 2020-06-16 DIAGNOSIS — N921 Excessive and frequent menstruation with irregular cycle: Secondary | ICD-10-CM | POA: Diagnosis not present

## 2020-06-16 DIAGNOSIS — F15950 Other stimulant use, unspecified with stimulant-induced psychotic disorder with delusions: Secondary | ICD-10-CM

## 2020-06-16 DIAGNOSIS — R87810 Cervical high risk human papillomavirus (HPV) DNA test positive: Secondary | ICD-10-CM | POA: Insufficient documentation

## 2020-06-16 DIAGNOSIS — R87610 Atypical squamous cells of undetermined significance on cytologic smear of cervix (ASC-US): Secondary | ICD-10-CM

## 2020-06-16 DIAGNOSIS — R809 Proteinuria, unspecified: Secondary | ICD-10-CM

## 2020-06-16 DIAGNOSIS — D500 Iron deficiency anemia secondary to blood loss (chronic): Secondary | ICD-10-CM | POA: Diagnosis not present

## 2020-06-16 DIAGNOSIS — E669 Obesity, unspecified: Secondary | ICD-10-CM

## 2020-06-16 DIAGNOSIS — F17210 Nicotine dependence, cigarettes, uncomplicated: Secondary | ICD-10-CM

## 2020-06-16 MED ORDER — FERROUS SULFATE 325 (65 FE) MG PO TABS: 325.0000 mg | ORAL_TABLET | Freq: Every day | ORAL | 2 refills | Status: DC

## 2020-06-16 MED ORDER — FLUOXETINE HCL 10 MG PO CAPS
10.0000 mg | ORAL_CAPSULE | Freq: Every day | ORAL | 2 refills | Status: DC
Start: 2020-06-16 — End: 2020-06-26

## 2020-06-16 MED ORDER — LOSARTAN POTASSIUM 50 MG PO TABS: 50.0000 mg | ORAL_TABLET | Freq: Every day | ORAL | 4 refills | Status: DC

## 2020-06-16 NOTE — Assessment & Plan Note (Signed)
Patient on losartan 50mg  daily, reportedly due to proteinuria. BP today 130/72, however patient reports not taking losartan today. Will follow up in 2 weeks, if repeat BP elevated at that time, will change to new diagnosis of HTN.  -Refilled losartan 50mg  daily

## 2020-06-16 NOTE — Assessment & Plan Note (Signed)
Reports smoking 7-8 cigarettes per day. Unable to quantify for how long. Does report that she wants to quit but it is just very difficult for her at this time.  Plan: -Use [place] [facility]uit Line -f/u in 2 weeks to reassess, will suggest use of Chantix and nicorette gum at that time if patient is unable to stop on her own at that time.

## 2020-06-16 NOTE — Patient Instructions (Addendum)
Marie Atkins, it was a pleasure seeing you in the clinic today.   As we discussed, we will be referring you to a OBGYN doctor here in [place] for a transvaginal ultrasound to monitor your fibroids and your heavy menstrual cycles.  We did some labs today to check your blood and your iron levels. I sent the iron tablets to your local pharmacy for you to take. You will be taking one tablet every day as directed.  As we discussed, it will be very beneficial for you to go to the NA meetings to help you stay clean, which is very important to make sure you stay in good health. We also highly recommend using the [place] Quit Line to try to stop smoking cigarettes.  I have refilled your home losartan and have sent it to your local pharmacy. I have also sent Prozac to the same pharmacy to help with your depression. Please take it as directed.  Once again, it was a pleasure meeting you.

## 2020-06-16 NOTE — Assessment & Plan Note (Signed)
Patient with history of menorrhagia with irregular cycles. Also reports having 4 uterine fibroids, which previous OBGYN doctor in [facility] stated were the cause of bleeding. Patient is complaining of mild lightheadedness and fatigue. Most recent Hgb 9.8 with MCV 78.7, likely mild IDA.   Plan: -Sent referral to OBGYN for transvaginal ultrasound to assess fibroids -Checking ferritin to look for IDA, but will send iron tablets today due to high suspicion -Will f/u in 2 weeks

## 2020-06-16 NOTE — Assessment & Plan Note (Signed)
Recent hospitalization at [facility] for crack cocaine relapse. States that she was clean for 14 years while in [place], [place], but after moving to [place] she has had constant interactions with drug dealers and it is very hard for her to stay away. Educated on adverse effects of cocaine use. Given pamphlets for NA meetings, which patient states she is going to attend. Denies any visual or tactile hallucinations at this time. Still has some cravings for cocaine but states that she will be able to control it.  Plan: -Educate patient on consequences of crack cocaine use -Encourage attendance of NA meetings -f/u at next visit.

## 2020-06-16 NOTE — Assessment & Plan Note (Signed)
Patient with PHQ9 score of 17 today, which she states is due to recent relapse. Reports that in the past, she used to Wellbutrin but too many side effects (dizziness, felt cloudy, etc) and subsequently stopped. Also reports that lexapro was not helpful. Does report that Prozac helped in the past. Denies SI/HI at this time.  Plan: -Ordered Prozac 10mg  daily -F/u in 2 weeks, if tolerating can increase dose of Prozac -Repeat PHQ9 score at next visit

## 2020-06-16 NOTE — Assessment & Plan Note (Signed)
Mild IDA likely 2/2 menorrhagia. Recent Hgb 9.8 with MCV 78.7. Complaining of mild lightheadedness and fatigue.  Plan: -F/u CBC and ferritin -Ordered iron tablets today due to high suspicion for IDA -Will f/u in 2 weeks

## 2020-06-16 NOTE — Progress Notes (Signed)
CC: Establishing PCP  HPI:  [name] is a 49 y.o. female with recent hospitalization at [facility] ED for visual and tactile hallucinations after relapsing on crack cocaine, who presents today to establish PCP. Please see assessment and plan for full HPI.  No past medical history on file.  COVID 19 positive on 05/23/20 (is vaccinated x2) Uterine fibroids Menorrhagia, with irregular cycles Proteinuria Depression Amphetamine and psychostimulant-induced psychosis with delusions  No current outpatient medications on file prior to visit.   No current facility-administered medications on file prior to visit.   Allergies  Allergen Reactions  . Ace Inhibitors Other (See Comments)    Other reaction(s): Cough (ALLERGY/intolerance)  . Lisinopril    No family history on file.  Family history significant for following: Mother - DM, HTN, CVA, MI (mother passed away at 58yo) Maternal uncle - colon cancer  Social History   Socioeconomic History  . Marital status: Single    Spouse name: Not on file  . Number of children: Not on file  . Years of education: Not on file  . Highest education level: Not on file  Occupational History  . Not on file  Tobacco Use  . Smoking status: Current Every Day Smoker    Types: Cigarettes  . Smokeless tobacco: Never Used  . Tobacco comment: 7-8 cigs per day; wants to quit  Vaping Use  . Vaping Use: Never used  Substance and Sexual Activity  . Alcohol use: Yes  . Drug use: Yes    Types: Cocaine  . Sexual activity: Not on file  Other Topics Concern  . Not on file  Social History Narrative  . Not on file   Social Determinants of Health   Financial Resource Strain:   . Difficulty of Paying Living Expenses:   Food Insecurity:   . Worried About Charity fundraiser in the Last Year:   . Arboriculturist in the Last Year:   Transportation Needs:   . Film/video editor (Medical):   Marland Kitchen Lack of Transportation (Non-Medical):   Physical Activity:    . Days of Exercise per Week:   . Minutes of Exercise per Session:   Stress:   . Feeling of Stress :   Social Connections:   . Frequency of Communication with Friends and Family:   . Frequency of Social Gatherings with Friends and Family:   . Attends Religious Services:   . Active Member of Clubs or Organizations:   . Attends Archivist Meetings:   Marland Kitchen Marital Status:   Intimate Partner Violence:   . Fear of Current or Ex-Partner:   . Emotionally Abused:   Marland Kitchen Physically Abused:   . Sexually Abused:     Review of Systems:  Please see assessment and plan for full ROS.  Physical Exam:  Vitals:   06/16/20 1507 06/16/20 1510  BP:  130/72  Pulse:  93  Temp:  98.3 F (36.8 C)  TempSrc:  Oral  SpO2:  100%  Weight: 182 lb 9.6 oz (82.8 kg)   Height: 5\' 4"  (1.626 m)    Physical Exam Constitutional:      Appearance: Normal appearance. She is obese.  HENT:     Head: Normocephalic and atraumatic.  Eyes:     Extraocular Movements: Extraocular movements intact.     Conjunctiva/sclera: Conjunctivae normal.     Pupils: Pupils are equal, round, and reactive to light.  Cardiovascular:     Rate and Rhythm: Normal rate and regular  rhythm.     Heart sounds: No murmur heard.  No friction rub. No gallop.   Pulmonary:     Effort: Pulmonary effort is normal.     Breath sounds: Normal breath sounds. No wheezing, rhonchi or rales.  Abdominal:     General: Bowel sounds are normal. There is no distension.     Palpations: Abdomen is soft.     Tenderness: There is no abdominal tenderness. There is no guarding or rebound.  Musculoskeletal:        General: No swelling or tenderness.  Skin:    General: Skin is warm and dry.  Neurological:     General: No focal deficit present.     Mental Status: She is alert and oriented to person, place, and time.  Psychiatric:        Mood and Affect: Mood normal.        Behavior: Behavior normal.        Thought Content: Thought content normal.         Judgment: Judgment normal.      Assessment & Plan:   See Encounters Tab for problem based charting.  Patient seen with Dr. Daryll Drown

## 2020-06-17 LAB — CBC WITH DIFFERENTIAL/PLATELET
Basophils Absolute: 0.1 10*3/uL (ref 0.0–0.2)
Basos: 1 %
EOS (ABSOLUTE): 0.1 10*3/uL (ref 0.0–0.4)
Eos: 1 %
Hematocrit: 32 % — ABNORMAL LOW (ref 34.0–46.6)
Hemoglobin: 9.7 g/dL — ABNORMAL LOW (ref 11.1–15.9)
Immature Grans (Abs): 0.1 10*3/uL (ref 0.0–0.1)
Immature Granulocytes: 1 %
Lymphocytes Absolute: 2.6 10*3/uL (ref 0.7–3.1)
Lymphs: 24 %
MCH: 24.7 pg — ABNORMAL LOW (ref 26.6–33.0)
MCHC: 30.3 g/dL — ABNORMAL LOW (ref 31.5–35.7)
MCV: 82 fL (ref 79–97)
Monocytes Absolute: 1 10*3/uL — ABNORMAL HIGH (ref 0.1–0.9)
Monocytes: 10 %
Neutrophils Absolute: 7.1 10*3/uL — ABNORMAL HIGH (ref 1.4–7.0)
Neutrophils: 63 %
Platelets: 424 10*3/uL (ref 150–450)
RBC: 3.92 x10E6/uL (ref 3.77–5.28)
RDW: 15 % (ref 11.7–15.4)
WBC: 11 10*3/uL — ABNORMAL HIGH (ref 3.4–10.8)

## 2020-06-17 LAB — FERRITIN: Ferritin: 11 ng/mL — ABNORMAL LOW (ref 15–150)

## 2020-06-20 ENCOUNTER — Telehealth: Payer: Self-pay | Admitting: Student

## 2020-06-20 NOTE — Telephone Encounter (Signed)
Tried to contact Marie Atkins via phone call to update her on results of mild IDA (Hgb 9.7, ferritin 11). Unable to reach patient, although did leave a voicemail. No signs or symptoms of an acute bleed noted during visit, so likely stable. She was sent home with ferrous sulfate tablets and was instructed to take one each day as directed. Will f/u at next clinic visit in 1.5 weeks.

## 2020-06-20 NOTE — Progress Notes (Signed)
Internal Medicine Clinic Attending  I saw and evaluated the patient.  I personally confirmed the key portions of the history and exam documented by Dr. Jinwala and I reviewed pertinent patient test results.  The assessment, diagnosis, and plan were formulated together and I agree with the documentation in the resident's note.  

## 2020-06-23 ENCOUNTER — Emergency Department (HOSPITAL_COMMUNITY)
Admission: EM | Admit: 2020-06-23 | Discharge: 2020-06-24 | Disposition: A | Discharge status: Home / Self Care | Payer: Commercial Managed Care - PPO | Source: Home / Self Care | Attending: Emergency Medicine | Admitting: Emergency Medicine

## 2020-06-23 ENCOUNTER — Other Ambulatory Visit: Payer: Self-pay

## 2020-06-23 DIAGNOSIS — F341 Dysthymic disorder: Secondary | ICD-10-CM | POA: Insufficient documentation

## 2020-06-23 DIAGNOSIS — R443 Hallucinations, unspecified: Secondary | ICD-10-CM | POA: Insufficient documentation

## 2020-06-23 DIAGNOSIS — Z20822 Contact with and (suspected) exposure to covid-19: Secondary | ICD-10-CM | POA: Insufficient documentation

## 2020-06-23 DIAGNOSIS — F329 Major depressive disorder, single episode, unspecified: Secondary | ICD-10-CM | POA: Insufficient documentation

## 2020-06-23 DIAGNOSIS — R45851 Suicidal ideations: Secondary | ICD-10-CM | POA: Diagnosis not present

## 2020-06-23 DIAGNOSIS — F17210 Nicotine dependence, cigarettes, uncomplicated: Secondary | ICD-10-CM | POA: Insufficient documentation

## 2020-06-23 DIAGNOSIS — F332 Major depressive disorder, recurrent severe without psychotic features: Secondary | ICD-10-CM | POA: Diagnosis not present

## 2020-06-23 LAB — RAPID URINE DRUG SCREEN, HOSP PERFORMED
Amphetamines: NOT DETECTED
Barbiturates: NOT DETECTED
Benzodiazepines: NOT DETECTED
Cocaine: POSITIVE — AB
Opiates: NOT DETECTED
Tetrahydrocannabinol: NOT DETECTED

## 2020-06-23 LAB — CBC WITH DIFFERENTIAL/PLATELET
Abs Immature Granulocytes: 0.06 10*3/uL (ref 0.00–0.07)
Basophils Absolute: 0.1 10*3/uL (ref 0.0–0.1)
Basophils Relative: 1 %
Eosinophils Absolute: 0.1 10*3/uL (ref 0.0–0.5)
Eosinophils Relative: 1 %
HCT: 33.1 % — ABNORMAL LOW (ref 36.0–46.0)
Hemoglobin: 10.3 g/dL — ABNORMAL LOW (ref 12.0–15.0)
Immature Granulocytes: 1 %
Lymphocytes Relative: 17 %
Lymphs Abs: 2.1 10*3/uL (ref 0.7–4.0)
MCH: 25.3 pg — ABNORMAL LOW (ref 26.0–34.0)
MCHC: 31.1 g/dL (ref 30.0–36.0)
MCV: 81.3 fL (ref 80.0–100.0)
Monocytes Absolute: 1.1 10*3/uL — ABNORMAL HIGH (ref 0.1–1.0)
Monocytes Relative: 9 %
Neutro Abs: 9.3 10*3/uL — ABNORMAL HIGH (ref 1.7–7.7)
Neutrophils Relative %: 71 %
Platelets: 413 10*3/uL — ABNORMAL HIGH (ref 150–400)
RBC: 4.07 MIL/uL (ref 3.87–5.11)
RDW: 16 % — ABNORMAL HIGH (ref 11.5–15.5)
WBC: 12.8 10*3/uL — ABNORMAL HIGH (ref 4.0–10.5)
nRBC: 0 % (ref 0.0–0.2)

## 2020-06-23 LAB — COMPREHENSIVE METABOLIC PANEL
ALT: 18 U/L (ref 0–44)
AST: 19 U/L (ref 15–41)
Albumin: 4 g/dL (ref 3.5–5.0)
Alkaline Phosphatase: 86 U/L (ref 38–126)
Anion gap: 10 (ref 5–15)
BUN: 13 mg/dL (ref 6–20)
CO2: 24 mmol/L (ref 22–32)
Calcium: 9.1 mg/dL (ref 8.9–10.3)
Chloride: 107 mmol/L (ref 98–111)
Creatinine, Ser: 1.09 mg/dL — ABNORMAL HIGH (ref 0.44–1.00)
GFR calc Af Amer: >60 mL/min (ref >60)
GFR calc non Af Amer: >60 mL/min (ref >60)
Glucose, Bld: 120 mg/dL — ABNORMAL HIGH (ref 70–99)
Potassium: 4 mmol/L (ref 3.5–5.1)
Sodium: 141 mmol/L (ref 135–145)
Total Bilirubin: 0.5 mg/dL (ref 0.3–1.2)
Total Protein: 8 g/dL (ref 6.5–8.1)

## 2020-06-23 LAB — ETHANOL: Alcohol, Ethyl (B): <10 mg/dL (ref <10)

## 2020-06-23 LAB — SARS CORONAVIRUS 2 BY RT PCR (HOSPITAL ORDER, PERFORMED IN ~~LOC~~ HOSPITAL LAB): SARS Coronavirus 2: NEGATIVE

## 2020-06-23 LAB — I-STAT BETA HCG BLOOD, ED (MC, WL, AP ONLY): I-stat hCG, quantitative: <5 m[IU]/mL (ref <5)

## 2020-06-23 MED ORDER — ACETAMINOPHEN 325 MG PO TABS: 650.0000 mg | ORAL_TABLET | ORAL | Status: DC | PRN

## 2020-06-23 NOTE — ED Triage Notes (Signed)
Patient arrived very tearful stating she is having suicidal thoughts.  She denies a plan.  She also states she has a drug problem with crack cocaine.

## 2020-06-23 NOTE — ED Provider Notes (Signed)
Marie Atkins Provider Note   CSN: [id number] Arrival date & time: 06/23/20  1243     History Chief Complaint  Patient presents with  . Suicidal    Marie Atkins is a 49 y.o. female.  Marie Atkins is a 49 y.o. female with history of cocaine abuse, depression, iron deficiency anemia, who presents to the emergency department for evaluation of suicidal ideations.  She states that she has been experiencing increasing suicidal ideations, and has thought about overdosing on pills, but states she does not have access to specific pills, has not thought about other specific plans.  Denies any HI, does report occasional hallucinations.  She reports that she has been increasingly depressed since she relapsed on crack cocaine use after being clean for 14 years.  She states that she is finally gotten herself a job but keeps missing work, and states that she needs help to get back on track.  She states that she feels hopeless and is tearful during encounter.  She denies any focal medical complaints, states that sometimes after using cocaine she has some chest tightness but denies any current chest pain, no shortness of breath, no abdominal pain, nausea, vomiting or diarrhea.  No fevers or chills or recent illness.  Denies other substance abuse.  No other aggravating or alleviating factors.        No past medical history on file.  Patient Active Problem List   Diagnosis Date Noted  . Menorrhagia 06/16/2020  . Iron deficiency anemia due to chronic blood loss 06/16/2020  . Proteinuria 06/16/2020  . Depression 06/16/2020  . Tobacco dependence due to cigarettes 06/16/2020  . ASCUS with positive high risk HPV cervical 06/16/2020  . Amphetamine and psychostimulant-induced psychosis with delusions ([place])     No past surgical history on file.   OB History   No obstetric history on file.     Family History  Problem Relation Age of Onset  . Hypertension Mother   .  Diabetes Mother   . CVA Mother   . Heart attack Mother   . Colon cancer Maternal Uncle     Social History   Tobacco Use  . Smoking status: Current Every Day Smoker    Types: Cigarettes  . Smokeless tobacco: Never Used  . Tobacco comment: 7-8 cigs per day; wants to quit  Vaping Use  . Vaping Use: Never used  Substance Use Topics  . Alcohol use: Yes  . Drug use: Yes    Types: Cocaine    Home Medications Prior to Admission medications   Medication Sig Start Date End Date Taking? Authorizing Provider  ferrous sulfate 325 (65 FE) MG tablet Take 1 tablet (325 mg total) by mouth daily. 06/16/20 06/16/21  Virl Axe, MD  FLUoxetine (PROZAC) 10 MG capsule Take 1 capsule (10 mg total) by mouth daily. 06/16/20 06/16/21  Virl Axe, MD  losartan (COZAAR) 50 MG tablet Take 1 tablet (50 mg total) by mouth daily. 06/16/20   Virl Axe, MD    Allergies    Ace inhibitors and Lisinopril  Review of Systems   Review of Systems  Constitutional: Negative for chills and fever.  HENT: Negative.   Eyes: Negative for visual disturbance.  Respiratory: Negative for cough and shortness of breath.   Cardiovascular: Negative for chest pain.  Gastrointestinal: Negative for abdominal pain, nausea and vomiting.  Musculoskeletal: Negative for arthralgias and myalgias.  Neurological: Negative for headaches.  Psychiatric/Behavioral: Positive for dysphoric mood, hallucinations and suicidal ideas.  Physical Exam Updated Vital Signs BP 140/90 (BP Location: Right Arm)   Pulse (!) 102   Temp 98.8 F (37.1 C) (Oral)   Resp 20   LMP 06/23/2020 (Exact Date)   SpO2 100%   Physical Exam Vitals and nursing note reviewed.  Constitutional:      General: She is not in acute distress.    Appearance: Normal appearance. She is well-developed and normal weight. She is not ill-appearing or diaphoretic.     Comments: Patient is tearful, but well-appearing and in no distress  HENT:     Head: Normocephalic  and atraumatic.  Eyes:     General:        Right eye: No discharge.        Left eye: No discharge.     Pupils: Pupils are equal, round, and reactive to light.  Cardiovascular:     Rate and Rhythm: Normal rate and regular rhythm.     Pulses: Normal pulses.     Heart sounds: Normal heart sounds. No murmur heard.  No friction rub. No gallop.   Pulmonary:     Effort: Pulmonary effort is normal. No respiratory distress.     Breath sounds: Normal breath sounds. No wheezing or rales.     Comments: Respirations equal and unlabored, patient able to speak in full sentences, lungs clear to auscultation bilaterally Abdominal:     General: Bowel sounds are normal. There is no distension.     Palpations: Abdomen is soft. There is no mass.     Tenderness: There is no abdominal tenderness. There is no guarding.     Comments: Abdomen soft, nondistended, nontender to palpation in all quadrants without guarding or peritoneal signs  Musculoskeletal:        General: No deformity.     Cervical back: Neck supple.  Skin:    General: Skin is warm and dry.     Capillary Refill: Capillary refill takes less than 2 seconds.  Neurological:     Mental Status: She is alert.     Coordination: Coordination normal.     Comments: Speech is clear, able to follow commands Moves extremities without ataxia, coordination intact Ambulatory with steady gait  Psychiatric:        Attention and Perception: She does not perceive auditory or visual hallucinations.        Mood and Affect: Mood is depressed. Affect is tearful.        Speech: Speech normal.        Behavior: Behavior normal. Behavior is cooperative.        Thought Content: Thought content includes suicidal ideation. Thought content does not include homicidal ideation. Thought content does not include homicidal or suicidal plan.     ED Results / Procedures / Treatments   Labs (all labs ordered are listed, but only abnormal results are displayed) Labs  Reviewed  COMPREHENSIVE METABOLIC PANEL - Abnormal; Notable for the following components:      Result Value   Glucose, Bld 120 (*)    Creatinine, Ser 1.09 (*)    All other components within normal limits  RAPID URINE DRUG SCREEN, HOSP PERFORMED - Abnormal; Notable for the following components:   Cocaine POSITIVE (*)    All other components within normal limits  CBC WITH DIFFERENTIAL/PLATELET - Abnormal; Notable for the following components:   WBC 12.8 (*)    Hemoglobin 10.3 (*)    HCT 33.1 (*)    MCH 25.3 (*)    RDW 16.0 (*)  Platelets 413 (*)    Neutro Abs 9.3 (*)    Monocytes Absolute 1.1 (*)    All other components within normal limits  SARS CORONAVIRUS 2 BY RT PCR (HOSPITAL ORDER, [facility])  ETHANOL  I-STAT BETA HCG BLOOD, ED ([facility], [facility], [facility] ONLY)    EKG None  Radiology No results found.  Procedures Procedures (including critical care time)  Medications Ordered in ED Medications  acetaminophen (TYLENOL) tablet 650 mg (has no administration in time range)    ED Course  I have reviewed the triage vital signs and the nursing notes.  Pertinent labs & imaging results that were available during my care of the patient were reviewed by me and considered in my medical decision making (see chart for details).    MDM Rules/Calculators/A&P                         49 year old female presents with suicidal ideations, she states that these have worsened recently, they are present in relation to her relapsing on crack cocaine use after being clean for 14 years.  She reports some occasional hallucinations, but denies HI.  Denies any focal medical complaints today, on arrival she is minimally tachycardic at 102 and mildly hypertensive, vitals otherwise normal.  Will get medical screening labs and place TTS consult.  Screening labs thus far have been reassuring.  Mild leukocytosis of 12.8, but patient without focal infectious symptoms, stable hemoglobin,  glucose of 110 and creatinine of 1.09 but no other electrolyte derangements, UDS positive for cocaine, but otherwise negative, other tox labs negative.  Covid test is pending.  At this time patient is medically cleared pending TTS evaluation.  The patient has been placed in psychiatric observation due to the need to provide a safe environment for the patient while obtaining psychiatric consultation and evaluation, as well as ongoing medical and medication management to treat the patient's condition.  The patient has not been placed under full IVC at this time.   Final Clinical Impression(s) / ED Diagnoses Final diagnoses:  Suicidal ideation    Rx / DC Orders ED Discharge Orders    None       Janet Berlin 06/23/20 1527    Charlesetta Shanks, MD 07/04/20 1545

## 2020-06-23 NOTE — ED Notes (Signed)
Patient is calm and resting quietly on bed.

## 2020-06-23 NOTE — ED Notes (Signed)
New Patient arrived, sitter took patients personal belongings and placed them in locker. Patient dressed out in purple scrubs.  Patients lunch tray ordered. Patient is resting watching tv on bed

## 2020-06-23 NOTE — BH Assessment (Signed)
Case was staffed with [facility] who recommended a inpatient admission for stabilization.

## 2020-06-23 NOTE — BH Assessment (Signed)
Comprehensive Clinical Assessment (CCA) Screening, Triage and Referral Note  06/23/2020 Marie Atkins [id number]  Patient presents this date voluntary with ongoing S/I and a plan to overdose. Patient denies any H/I or AVH. Patient denies any previous attempts or gestures at self harm. Patient denies any current medication interventions or having a current OP provider. Patient states she "has struggled with cocaine" for years and reports using various amounts of cocaine (crack) three to four times a week with last use earlier this date when she reports she used one gram. Patient states she spent two years in a rehabilitation program at [facility] in 2007 where she was diagnosed with depression. Patient denies any other inpatient services since then although reports ongoing symptoms this date to include feeling hopeless and isolating. Patient was last seen on 06/15/20 when she presented requesting assistance for ongoing SA issues. Patient did not meet inpatient criteria at that time and was refereed to OP services. Patient states she failed to follow up with those services due to ongoing use. Patient states her frequency of use has steadily increased until today when she realized "she could not do it anymore" and started having thoughts of overdosing on OTC medications. Patient denies the use of any other illicit substances. Patient denies any legal issues or history of abuse.    Per notes this date on admission: Patient presents with a history of cocaine abuse, depression, iron deficiency anemia, who presents to the emergency department for evaluation of suicidal ideations.  She states that she has been experiencing increasing suicidal ideations, and has thought about overdosing on pills, but states she does not have access to specific pills, has not thought about other specific plans.  Denies any HI, does report occasional hallucinations. She reports that she has been increasingly depressed since she relapsed  on crack cocaine use after being clean for 14 years. She states that she is finally gotten herself a job but keeps missing work, and states that she needs help to get back on track. She states that she feels hopeless and is tearful during encounter. She denies any focal medical complaints, states that sometimes after using cocaine she has some chest tightness but denies any current chest pain, no shortness of breath, no abdominal pain, nausea, vomiting or diarrhea. No fevers or chills or recent illness. Denies other substance abuse.  No other aggravating or alleviating factors.  Patient is currently alert and oriented, her thoughts organized and her memory intact. She does not appear to be responding to any internal stimuli. Her mood is depressed and patient is tearful at times. Patient's current level of judgment, insight and impulse control are poor. Case was staffed with [place] who recommended a inpatient admission for stabilization.    Visit Diagnosis: MDD recurrent without psychotic features, severe, Cocaine use    ICD-10-CM   1. Suicidal ideation  R45.851     Patient Reported Information How did you hear about [place]? Self   Referral name: Self   Referral phone number: No data recorded Whom do you see for routine medical problems? I don't have a doctor   Practice/Facility Name: No data recorded  Practice/Facility Phone Number: No data recorded  Name of Contact: No data recorded  Contact Number: No data recorded  Contact Fax Number: No data recorded  Prescriber Name: No data recorded  Prescriber Address (if known): No data recorded What Is the Reason for Your Visit/Call Today? SA issues SI  How Long Has This Been Causing You Problems?  1-6 months  Have You Recently Been in Any Inpatient Treatment (Hospital/Detox/Crisis Center/28-Day Program)? No   Name/Location of Program/Hospital:No data recorded  How Long Were You There? No data recorded  When Were You Discharged? No data  recorded Have You Ever Received Services From [facility] Before? Yes   Who Do You See at [facility]? Pt was seen by TTS on 03/18/20  Have You Recently Had Any Thoughts About Hurting Yourself? Yes   Are You Planning to Commit Suicide/Harm Yourself At This time?  Yes  Have you Recently Had Thoughts About Hurting Someone Guadalupe Dawn? No   Explanation: No data recorded Have You Used Any Alcohol or Drugs in the Past 24 Hours? Yes   How Long Ago Did You Use Drugs or Alcohol?  0400   What Did You Use and How Much? Unknown amount of cocaine  What Do You Feel Would Help You the Most Today? Therapy;Medication  Do You Currently Have a Therapist/Psychiatrist? No   Name of Therapist/Psychiatrist: No data recorded  Have You Been Recently Discharged From Any Office Practice or Programs? No   Explanation of Discharge From Practice/Program:  No data recorded    CCA Screening Triage Referral Assessment Type of Contact: Tele-Assessment   Is this Initial or Reassessment? Initial Assessment   Date Telepsych consult ordered in [facility]:  06/23/20   Time Telepsych consult ordered in [facility]:  0300  Patient Reported Information Reviewed? Yes   Patient Left Without Being Seen? No data recorded  Reason for Not Completing Assessment: No data recorded Collateral Involvement: None at this time  Does Patient Have a Court Appointed Legal Guardian? No data recorded  Name and Contact of Legal Guardian:  No data recorded If Minor and Not Living with Parent(s), Who has Custody? NA  Is CPS involved or ever been involved? Never  Is APS involved or ever been involved? Never  Patient Determined To Be At Risk for Harm To Self or Others Based on Review of Patient Reported Information or Presenting Complaint? Yes, for Self-Harm   Method: No data recorded  Availability of Means: No data recorded  Intent: No data recorded  Notification Required: No data recorded  Additional Information for Danger to Others Potential:   No data recorded  Additional Comments for Danger to Others Potential:  No data recorded  Are There Guns or Other Weapons in Your Home?  No data recorded   Types of Guns/Weapons: No data recorded   Are These Weapons Safely Secured?                              No data recorded   Who Could Verify You Are Able To Have These Secured:    No data recorded Do You Have any Outstanding Charges, Pending Court Dates, Parole/Probation? No data recorded Contacted To Inform of Risk of Harm To Self or Others: Other: Comment (NA)  Location of Assessment: [facility] ED  Does Patient Present under Involuntary Commitment? No   IVC Papers Initial File Date: No data recorded  [place] of Residence: [place]  Patient Currently Receiving the Following Services: Medication Management   Determination of Need: Routine (7 days)   Options For Referral: Outpatient Therapy Case was staffed with Starkes FNP who recommended a inpatient admission for stabilization.     Mamie Nick, LCAS

## 2020-06-24 ENCOUNTER — Encounter (HOSPITAL_COMMUNITY): Payer: Self-pay | Admitting: Family

## 2020-06-24 ENCOUNTER — Inpatient Hospital Stay (HOSPITAL_COMMUNITY)
Admission: AD | Admit: 2020-06-24 | Discharge: 2020-06-28 | DRG: 885 | Disposition: A | Discharge status: Home / Self Care | Payer: Commercial Managed Care - PPO | Source: Intra-hospital | Attending: Psychiatry | Admitting: Psychiatry

## 2020-06-24 DIAGNOSIS — G4700 Insomnia, unspecified: Secondary | ICD-10-CM | POA: Diagnosis present

## 2020-06-24 DIAGNOSIS — F332 Major depressive disorder, recurrent severe without psychotic features: Principal | ICD-10-CM | POA: Diagnosis present

## 2020-06-24 DIAGNOSIS — F17210 Nicotine dependence, cigarettes, uncomplicated: Secondary | ICD-10-CM | POA: Diagnosis present

## 2020-06-24 DIAGNOSIS — I10 Essential (primary) hypertension: Secondary | ICD-10-CM | POA: Diagnosis present

## 2020-06-24 DIAGNOSIS — D509 Iron deficiency anemia, unspecified: Secondary | ICD-10-CM | POA: Diagnosis present

## 2020-06-24 DIAGNOSIS — R45851 Suicidal ideations: Secondary | ICD-10-CM | POA: Diagnosis present

## 2020-06-24 DIAGNOSIS — K219 Gastro-esophageal reflux disease without esophagitis: Secondary | ICD-10-CM | POA: Diagnosis present

## 2020-06-24 DIAGNOSIS — Z20822 Contact with and (suspected) exposure to covid-19: Secondary | ICD-10-CM | POA: Diagnosis present

## 2020-06-24 DIAGNOSIS — Z79899 Other long term (current) drug therapy: Secondary | ICD-10-CM

## 2020-06-24 HISTORY — DX: Essential (primary) hypertension: I10

## 2020-06-24 HISTORY — DX: Cocaine abuse, uncomplicated: F14.10

## 2020-06-24 HISTORY — DX: Depression, unspecified: F32.A

## 2020-06-24 HISTORY — DX: Iron deficiency anemia, unspecified: D50.9

## 2020-06-24 LAB — LIPID PANEL
Cholesterol: 166 mg/dL (ref 0–200)
HDL: 69 mg/dL (ref >40)
LDL Cholesterol: 80 mg/dL (ref 0–99)
Total CHOL/HDL Ratio: 2.4 RATIO
Triglycerides: 87 mg/dL (ref <150)
VLDL: 17 mg/dL (ref 0–40)

## 2020-06-24 LAB — TSH: TSH: 1.385 u[IU]/mL (ref 0.350–4.500)

## 2020-06-24 MED ORDER — MAGNESIUM HYDROXIDE 400 MG/5ML PO SUSP
30.0000 mL | Freq: Every day | ORAL | Status: DC | PRN
  Administered 2020-06-24 – 2020-06-26 (×2): 30 mL via ORAL
  Filled 2020-06-24 (×2): qty 30

## 2020-06-24 MED ORDER — TRAZODONE HCL 50 MG PO TABS
50.0000 mg | ORAL_TABLET | Freq: Every evening | ORAL | Status: DC | PRN
  Administered 2020-06-24 – 2020-06-25 (×2): 50 mg via ORAL
  Filled 2020-06-24 (×3): qty 1

## 2020-06-24 MED ORDER — LOSARTAN POTASSIUM 25 MG PO TABS
25.0000 mg | ORAL_TABLET | Freq: Every day | ORAL | Status: DC
  Administered 2020-06-24 – 2020-06-28 (×5): 25 mg via ORAL
  Filled 2020-06-24 (×7): qty 1

## 2020-06-24 MED ORDER — HYDROXYZINE HCL 25 MG PO TABS
25.0000 mg | ORAL_TABLET | Freq: Three times a day (TID) | ORAL | Status: DC | PRN
  Administered 2020-06-24 – 2020-06-25 (×2): 25 mg via ORAL
  Filled 2020-06-24 (×4): qty 1

## 2020-06-24 MED ORDER — FLUOXETINE HCL 10 MG PO CAPS
10.0000 mg | ORAL_CAPSULE | Freq: Every day | ORAL | Status: DC
  Administered 2020-06-24 – 2020-06-26 (×3): 10 mg via ORAL
  Filled 2020-06-24 (×6): qty 1

## 2020-06-24 MED ORDER — WHITE PETROLATUM EX OINT
TOPICAL_OINTMENT | CUTANEOUS | Status: AC
  Filled 2020-06-24: qty 5

## 2020-06-24 MED ORDER — FERROUS SULFATE 325 (65 FE) MG PO TABS
325.0000 mg | ORAL_TABLET | Freq: Every day | ORAL | Status: DC
  Administered 2020-06-24 – 2020-06-28 (×5): 325 mg via ORAL
  Filled 2020-06-24 (×8): qty 1

## 2020-06-24 MED ORDER — FOLIC ACID 1 MG PO TABS
1.0000 mg | ORAL_TABLET | Freq: Every day | ORAL | Status: DC
  Administered 2020-06-24 – 2020-06-28 (×5): 1 mg via ORAL
  Filled 2020-06-24 (×8): qty 1

## 2020-06-24 MED ORDER — HYDROXYZINE HCL 25 MG PO TABS
ORAL_TABLET | ORAL | Status: AC
  Filled 2020-06-24: qty 1

## 2020-06-24 MED ORDER — PANTOPRAZOLE SODIUM 40 MG PO TBEC
40.0000 mg | DELAYED_RELEASE_TABLET | Freq: Every day | ORAL | Status: DC
  Administered 2020-06-24 – 2020-06-28 (×5): 40 mg via ORAL
  Filled 2020-06-24 (×8): qty 1

## 2020-06-24 MED ORDER — THIAMINE HCL 100 MG PO TABS
100.0000 mg | ORAL_TABLET | Freq: Every day | ORAL | Status: DC
  Administered 2020-06-24 – 2020-06-28 (×5): 100 mg via ORAL
  Filled 2020-06-24 (×8): qty 1

## 2020-06-24 MED ORDER — ALUM & MAG HYDROXIDE-SIMETH 200-200-20 MG/5ML PO SUSP: 30.0000 mL | ORAL | Status: DC | PRN

## 2020-06-24 MED ORDER — ACETAMINOPHEN 325 MG PO TABS: 650.0000 mg | ORAL_TABLET | Freq: Four times a day (QID) | ORAL | Status: DC | PRN

## 2020-06-24 NOTE — Tx Team (Signed)
Initial Treatment Plan 06/24/2020 7:09 AM Shamika Kalman Shan JAS:[id number]    PATIENT STRESSORS: Financial difficulties  Cocaine abuse   PATIENT STRENGTHS: Capable of independent living Armed forces logistics/support/administrative officer Motivation for treatment/growth Supportive family/friends   PATIENT IDENTIFIED PROBLEMS: Cocaine abuse- relapsing after 14 years  Visual hallucinations of "shadows"  Missing work, "keep messing up," "so much going on"                 DISCHARGE CRITERIA:  Improved stabilization in mood, thinking, and/or behavior Motivation to continue treatment in a less acute level of care Verbal commitment to aftercare and medication compliance  PRELIMINARY DISCHARGE PLAN: Outpatient therapy Return to previous living arrangement  PATIENT/FAMILY INVOLVEMENT: This treatment plan has been presented to and reviewed with the patient, Noelia Lenart, and/or family member.  The patient and family have been given the opportunity to ask questions and make suggestions.  Harlow Asa, RN 06/24/2020, 7:09 AM

## 2020-06-24 NOTE — Progress Notes (Signed)
°   06/24/20 2305  COVID-19 Daily Checkoff  Have you had a fever (temp > 37.80C/100F)  in the past 24 hours?  No  If you have had runny nose, nasal congestion, sneezing in the past 24 hours, has it worsened? No  COVID-19 EXPOSURE  Have you traveled outside the state in the past 14 days? No  Have you been in contact with someone with a confirmed diagnosis of COVID-19 or PUI in the past 14 days without wearing appropriate PPE? No  Have you been living in the same home as a person with confirmed diagnosis of COVID-19 or a PUI (household contact)? No  Have you been diagnosed with COVID-19? No

## 2020-06-24 NOTE — Progress Notes (Signed)
   06/24/20 0530  COVID-19 Daily Checkoff  Have you had a fever (temp > 37.80C/100F)  in the past 24 hours?  No  COVID-19 EXPOSURE  Have you traveled outside the state in the past 14 days? No  Have you been in contact with someone with a confirmed diagnosis of COVID-19 or PUI in the past 14 days without wearing appropriate PPE? No  Have you been living in the same home as a person with confirmed diagnosis of COVID-19 or a PUI (household contact)? No  Have you been diagnosed with COVID-19? No

## 2020-06-24 NOTE — Progress Notes (Signed)
   06/24/20 2307  Psych Admission Type (Psych Patients Only)  Admission Status Voluntary  Psychosocial Assessment  Patient Complaints Anxiety  Eye Contact Brief  Facial Expression Anxious  Affect Appropriate to circumstance  Speech Logical/coherent  Interaction Minimal  Motor Activity Fidgety  Appearance/Hygiene Improved  Behavior Characteristics Appropriate to situation  Mood Anxious  Thought Process  Coherency WDL  Content WDL  Delusions None reported or observed  Perception WDL  Hallucination Visual  Judgment Poor  Confusion None  Danger to Self  Current suicidal ideation? Denies  Danger to Others  Danger to Others None reported or observed

## 2020-06-24 NOTE — BHH Suicide Risk Assessment (Signed)
[facility] Admission Suicide Risk Assessment   Nursing information obtained from:  Patient Demographic factors:  Low socioeconomic status, Living alone Current Mental Status:  Suicidal ideation indicated by patient Loss Factors:  Financial problems / change in socioeconomic status Historical Factors:  Impulsivity Risk Reduction Factors:  Sense of responsibility to family, Employed  Total Time spent with patient: 30 minutes Principal Problem: <principal problem not specified> Diagnosis:  Active Problems:   Severe recurrent major depression without psychotic features ([place])  Subjective Data: Patient is seen and examined.  Patient is a 83-year female with a past psychiatric history significant for 3-4 times a week dependence in long-term remission until recently, depression, iron deficiency anemia as well as hypertension who presented to the [facility] emergency department on 06/23/2020 with suicidal ideation.  The patient stated that she had been clean of drugs for 14 years.  She stated that she had gotten into conflicts with her significant other at that time, and then relapsed.  She stated that she did not feel as though there were any other external stressors.  That was approximately 3 weeks ago.  She stated that she had been using crack cocaine 3-4 times a week since then.  [facility].  She remained in that for 2 years.  No other psychiatric treatment.  She was last seen on 06/15/2020 and was requesting assistance for substance abuse services, but was referred to outpatient treatment.  She failed to follow-up on that.  She stated in the emergency department she was going to overdose on over-the-counter medications.  She stated she had been treated with antidepressants in the past.  The only one she could remember was Wellbutrin, and she stated that oversedated her and she did not take it long-term.  She was recommended to start  fluoxetine recently, but had not started it as of admission.  She was quite tearful during the interview, and is requesting residential substance abuse treatment.  She was admitted to the hospital for evaluation and stabilization.  Continued Clinical Symptoms:  Alcohol Use Disorder Identification Test Final Score (AUDIT): 5 The "Alcohol Use Disorders Identification Test", Guidelines for Use in Primary Care, Second Edition.  World [facility]). Score between 0-7:  no or low risk or alcohol related problems. Score between 8-15:  moderate risk of alcohol related problems. Score between 16-19:  high risk of alcohol related problems. Score 20 or above:  warrants further diagnostic evaluation for alcohol dependence and treatment.   CLINICAL FACTORS:   Depression:   Anhedonia Comorbid alcohol abuse/dependence Hopelessness Impulsivity Insomnia Alcohol/Substance Abuse/Dependencies   Musculoskeletal: Strength & Muscle Tone: within normal limits Gait & Station: normal Patient leans: N/A  Psychiatric Specialty Exam: Physical Exam Vitals and nursing note reviewed.  Constitutional:      Appearance: Normal appearance.  HENT:     Head: Normocephalic and atraumatic.  Pulmonary:     Effort: Pulmonary effort is normal.  Neurological:     General: No focal deficit present.     Mental Status: She is alert and oriented to person, place, and time.     Review of Systems  Blood pressure 128/85, pulse 79, temperature 98.1 F (36.7 C), temperature source Oral, resp. rate 18, height 5\' 4"  (1.626 m), weight 80.7 kg, last menstrual period 06/23/2020, SpO2 100 %.Body mass index is 30.55 kg/m.  General Appearance: Disheveled  Eye Contact:  Minimal  Speech:  Normal Rate  Volume:  Decreased  Mood:  Depressed  Affect:  Tearful  Thought Process:  Coherent and Descriptions of Associations: Intact  Orientation:  Full (Time, Place, and Person)  Thought Content:  Logical  Suicidal Thoughts:   Yes.  without intent/plan  Homicidal Thoughts:  No  Memory:  Immediate;   Good Recent;   Good Remote;   Good  Judgement:  Intact  Insight:  Fair  Psychomotor Activity:  Decreased  Concentration:  Concentration: Fair and Attention Span: Fair  Recall:  [employer] of Knowledge:  Good  Language:  Good  Akathisia:  Negative  Handed:  Right  AIMS (if indicated):     Assets:  Desire for Improvement Resilience  ADL's:  Intact  Cognition:  WNL  Sleep:         COGNITIVE FEATURES THAT CONTRIBUTE TO RISK:  None    SUICIDE RISK:   Moderate:  Frequent suicidal ideation with limited intensity, and duration, some specificity in terms of plans, no associated intent, good self-control, limited dysphoria/symptomatology, some risk factors present, and identifiable protective factors, including available and accessible social support.  PLAN OF CARE: Patient is seen and examined.  Patient is a 49 year old female with the above-stated past psychiatric history who is admitted secondary to suicidal ideation.  She will be admitted to the hospital.  She will be integrated in the milieu.  She will be encouraged to attend groups.  We will continue the order to start the fluoxetine.  We will place her on 10 mg p.o. daily.  She also has a history of hypertension as well as iron deficiency anemia.  She will be started on iron sulfate 325 mg p.o. daily.  She will also continue on losartan 25 mg p.o. daily.  She does have an allergy to ACE inhibitors.  But has been on this for a while and tolerates it well.  We will also put her on Protonix 40 mg p.o. daily for gastric protection.  Currently her vital signs are stable, she is afebrile.  Review of her admission laboratories revealed a mildly elevated glucose at 120.  She does have an anemia with a hemoglobin of 10.3 and hematocrit of 33.1.  Her MCH, RDW and platelets are abnormal.  Her MCH is slightly low at 25.3, RDW slightly elevated at 16.  Her platelets are 413,000.   Acetaminophen is less than 10, salicylate less than 7.  Beta-hCG was less than 1.  Urinalysis showed 20 mg per DL of ketones, but otherwise negative.  Drug screen was positive for cocaine.  Blood alcohol was less than 10.  She will discuss options for residential substance abuse treatment with social work and Investment banker, operational.  Her EKG showed a sinus rhythm with a normal QTc interval.  I certify that inpatient services furnished can reasonably be expected to improve the patient's condition.   Sharma Covert, MD 06/24/2020, 9:18 AM

## 2020-06-24 NOTE — Progress Notes (Signed)
Psychoeducational Group Note  Date:  06/24/2020 Time: 2030 Group Topic/Focus:  wrap up group  Participation Level: Did Not Attend  Participation Quality:  Not Applicable  Affect:  Not Applicable  Cognitive:  Not Applicable  Insight:  Not Applicable  Engagement in Group: Not Applicable  Additional Comments: Pt was notified that group was beginning but returned to room. Pt did take a copy of the readings that were shared during group .  Shellia Cleverly 06/24/2020, 10:25 PM
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
## 2020-06-24 NOTE — BHH Group Notes (Signed)
Adult Psychoeducational Group Note  Date:  06/24/2020 Time:  11:11 AM  Group Topic/Focus:  Goals Group:   The focus of this group is to help patients establish daily goals to achieve during treatment and discuss how the patient can incorporate goal setting into their daily lives to aide in recovery.  Participation Level:  Did Not Attend  Paulino Rily 06/24/2020, 11:11 AM
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
## 2020-06-24 NOTE — BHH Group Notes (Signed)
LCSW Group Therapy Note  06/24/2020    10:00-11:00am   Type of Therapy and Topic:  Group Therapy: Early Messages Received About Anger  Participation Level:  Minimal   Description of Group:   In this group, patients shared and discussed the early messages received in their lives about anger through parental or other adult modeling, teaching, repression, punishment, violence, and more.  Participants identified how those childhood lessons influence even now how they usually or often react when angered.  The group discussed that anger is a secondary emotion and what may be the underlying emotional themes that come out through anger outbursts or that are ignored through anger suppression.  Finally, as a group there was a conversation about the workbook's quote that "There is nothing wrong with anger; it is just a sign something needs to change."     Therapeutic Goals: Patients will identify one or more childhood message about anger that they received and how it was taught to them. Patients will discuss how these childhood experiences have influenced and continue to influence their own expression or repression of anger even today. Patients will explore possible primary emotions that tend to fuel their secondary emotion of anger. Patients will learn that anger itself is normal and cannot be eliminated, and that healthier coping skills can assist with resolving conflict rather than worsening situations.  Summary of Patient Progress:  The patient was not present for the first 35 minutes of group, only the last 25.  She listened attentively once she arrived, but did not make any comments or share.  Therapeutic Modalities:   Cognitive Behavioral Therapy Motivation Interviewing  Maretta Los  .

## 2020-06-24 NOTE — Progress Notes (Signed)
Pt presents with anxiety.  Pt denied having SI/HI.  Endorsed visual hallucinations.  RN established rapport with pt and assessed for needs/concerns. RN Monitored vitals and administered medications per MD orders.  Pt tolerated medications without incident.  Pt able to take a shower and says she is looking forward to sister bringing in clothes later today. Pt remains safe on the unit with q 15 min safety checks.

## 2020-06-24 NOTE — BHH Group Notes (Signed)
Adult Psychoeducational Group Note  Date:  06/24/2020 Time:  2:40 PM  Group Topic/Focus:  Identifying Needs:   The focus of this group is to help patients identify their personal needs that have been historically problematic and identify healthy behaviors to address their needs.  Participation Level:  Active  Participation Quality:  Appropriate  Affect:  Appropriate  Cognitive:  Oriented  Insight: Improving  Engagement in Group:  Engaged  Modes of Intervention:  Discussion, Education, Exploration and Support  Additional Comments:  Pt rates her energy as a 5/10. Was able to participate and identify some needs.  Paulino Rily 06/24/2020, 2:40 PM

## 2020-06-25 DIAGNOSIS — F332 Major depressive disorder, recurrent severe without psychotic features: Secondary | ICD-10-CM | POA: Diagnosis not present

## 2020-06-25 MED ORDER — BACITRACIN-NEOMYCIN-POLYMYXIN OINTMENT TUBE
TOPICAL_OINTMENT | CUTANEOUS | Status: DC | PRN
  Filled 2020-06-25: qty 14.17

## 2020-06-25 NOTE — Progress Notes (Signed)
   06/25/20 2233  Psych Admission Type (Psych Patients Only)  Admission Status Voluntary  Psychosocial Assessment  Patient Complaints Anxiety  Eye Contact Fair  Facial Expression Other (Comment) (pleasant, appropriate)  Affect Appropriate to circumstance  Speech Logical/coherent  Interaction Minimal  Motor Activity Other (Comment) (WDL)  Appearance/Hygiene Unremarkable  Behavior Characteristics Appropriate to situation  Mood Anxious  Thought Process  Coherency WDL  Content WDL  Delusions None reported or observed  Perception WDL  Hallucination None reported or observed  Judgment Impaired  Confusion None  Danger to Self  Current suicidal ideation? Denies  Danger to Others  Danger to Others None reported or observed

## 2020-06-25 NOTE — Progress Notes (Signed)
[facility] MD Progress Note  06/25/2020 2:51 PM Marie Atkins  MRN:  [id number]  Subjective: Marie Atkins reports, "I'm feeling better today. The group sessions is helping quite a bit. My depression/anxiety symptoms are coming down greatly"  Objective: Patient is seen and examined. 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 The patient stated that she had been clean of drugs for 14 years. She stated that she had gotten into conflicts with her significant other at that time, and then relapsed.  Marie Atkins is seen, chart reviewed. The chart findings discussed with the treatment. She presents today alert, oriented x 4. She is visible on the unit, attending group sessions. She presents with a much brighter affect than when first came into the hospital. She is making good eye contact. She says she is feeling better today. She says the group session is helping her a lot because she is realizing now that she is not the only one struggling with substance abuse issues. She is taking & tolerating her treatment regimen. She denies any adverse effects. She denies any SIHI, AVH, delusional thoughts or paranoia. She does not appear to be responding to any internal stimuli. She rates her depression #6 & anxiety #5. She is in agreement to continue current plan of care as already in progress.  Principal Problem: Severe recurrent major depression without psychotic features ([place])  Diagnosis: Principal Problem:   Severe recurrent major depression without psychotic features ([place])  Total Time spent with patient: 25 minutes  Past Psychiatric History: See H&P  Past Medical History:  Past Medical History:  Diagnosis Date  . Cocaine abuse ([place])   . Depression   . HTN (hypertension)   .  Iron deficiency anemia    History reviewed. No pertinent surgical history. Family History:  Family History  Problem Relation Age of Onset  . Hypertension Mother   . Diabetes Mother   . CVA Mother   . Heart attack Mother   . Colon cancer Maternal Uncle    Family Psychiatric  History: See H&P  Social History:  Social History   Substance and Sexual Activity  Alcohol Use Yes   Comment: "sometimes every other day," "1 to 2 cans of beer or a glass or two of wine"     Social History   Substance and Sexual Activity  Drug Use Yes  . Types: Cocaine   Comment: 3 to 4 times a week, last use on Thursday (06/22/2020)    Social History   Socioeconomic History  . Marital status: Single    Spouse name: Not on file  . Number of children: Not on file  . Years of education: Not on file  . Highest education level: Not on file  Occupational History  . Not on file  Tobacco Use  . Smoking status: Current Every Day Smoker    Types: Cigarettes  . Smokeless tobacco: Never Used  . Tobacco comment: 10 cigarettes/day  Vaping Use  . Vaping Use: Never used  Substance and Sexual Activity  . Alcohol use: Yes    Comment: "sometimes every other day," "1 to 2 cans of beer or a glass or two of wine"  . Drug use: Yes    Types: Cocaine    Comment: 3 to 4 times a week, last use on Thursday (06/22/2020)  .  Sexual activity: Not Currently  Other Topics Concern  . Not on file  Social History Narrative  . Not on file   Social Determinants of Health   Financial Resource Strain:   . Difficulty of Paying Living Expenses:   Food Insecurity:   . Worried About Charity fundraiser in the Last Year:   . Arboriculturist in the Last Year:   Transportation Needs:   . Film/video editor (Medical):   Marland Kitchen Lack of Transportation (Non-Medical):   Physical Activity:   . Days of Exercise per Week:   . Minutes of Exercise per Session:   Stress:   . Feeling of Stress :   Social Connections:   . Frequency of  Communication with Friends and Family:   . Frequency of Social Gatherings with Friends and Family:   . Attends Religious Services:   . Active Member of Clubs or Organizations:   . Attends Archivist Meetings:   Marland Kitchen Marital Status:    Additional Social History:   Sleep: Good  Appetite:  Good  Current Medications: Current Facility-Administered Medications  Medication Dose Route Frequency Provider Last Rate Last Admin  . acetaminophen (TYLENOL) tablet 650 mg  650 mg Oral Q6H PRN Lindon Romp A, NP      . alum & mag hydroxide-simeth (MAALOX/MYLANTA) 200-200-20 MG/5ML suspension 30 mL  30 mL Oral Q4H PRN Lindon Romp A, NP      . ferrous sulfate tablet 325 mg  325 mg Oral Daily Lindon Romp A, NP   325 mg at 06/25/20 0807  . FLUoxetine (PROZAC) capsule 10 mg  10 mg Oral Daily Lindon Romp A, NP   10 mg at 06/25/20 0807  . folic acid (FOLVITE) tablet 1 mg  1 mg Oral Daily Sharma Covert, MD   1 mg at 06/25/20 4270  . hydrOXYzine (ATARAX/VISTARIL) tablet 25 mg  25 mg Oral TID PRN Lindon Romp A, NP   25 mg at 06/24/20 0600  . losartan (COZAAR) tablet 25 mg  25 mg Oral Daily Sharma Covert, MD   25 mg at 06/25/20 6237  . magnesium hydroxide (MILK OF MAGNESIA) suspension 30 mL  30 mL Oral Daily PRN Lindon Romp A, NP   30 mL at 06/24/20 1804  . pantoprazole (PROTONIX) EC tablet 40 mg  40 mg Oral Daily Sharma Covert, MD   40 mg at 06/25/20 0807  . thiamine tablet 100 mg  100 mg Oral Daily Sharma Covert, MD   100 mg at 06/25/20 6283  . traZODone (DESYREL) tablet 50 mg  50 mg Oral QHS PRN Rozetta Nunnery, NP   50 mg at 06/24/20 2121   Lab Results:  Results for orders placed or performed during the hospital encounter of 06/24/20 (from the past 48 hour(s))  Lipid panel     Status: None   Collection Time: 06/24/20  5:42 PM  Result Value Ref Range   Cholesterol 166 0 - 200 mg/dL   Triglycerides 87 <150 mg/dL   HDL 69 >40 mg/dL   Total CHOL/HDL Ratio 2.4 RATIO   VLDL 17 0  - 40 mg/dL   LDL Cholesterol 80 0 - 99 mg/dL    Comment:        Total Cholesterol/HDL:CHD Risk Coronary Heart Disease Risk Table                     Men   Women  1/2 Average Risk  3.4   3.3  Average Risk       5.0   4.4  2 X Average Risk   9.6   7.1  3 X Average Risk  23.4   11.0        Use the calculated Patient Ratio above and the CHD Risk Table to determine the patient's CHD Risk.        ATP III CLASSIFICATION (LDL):  <100     mg/dL   Optimal  100-129  mg/dL   Near or Above                    Optimal  130-159  mg/dL   Borderline  160-189  mg/dL   High  >190     mg/dL   Very High Performed at [facility] [address] [place], [place] [address]   TSH     Status: None   Collection Time: 06/24/20  5:42 PM  Result Value Ref Range   TSH 1.385 0.350 - 4.500 uIU/mL    Comment: Performed by a 3rd Generation assay with a functional sensitivity of <=0.01 uIU/mL. Performed at [facility] [place] [address], [place], [place] [address]    Blood Alcohol level:  Lab Results  Component Value Date   ETH <10 06/23/2020   ETH <10 25/42/7062   Metabolic Disorder Labs: No results found for: HGBA1C, MPG No results found for: PROLACTIN Lab Results  Component Value Date   CHOL 166 06/24/2020   TRIG 87 06/24/2020   HDL 69 06/24/2020   CHOLHDL 2.4 06/24/2020   VLDL 17 06/24/2020   LDLCALC 80 06/24/2020   Physical Findings: AIMS: Facial and Oral Movements Muscles of Facial Expression: None, normal Lips and Perioral Area: None, normal Jaw: None, normal Tongue: None, normal,Extremity Movements Upper (arms, wrists, hands, fingers): None, normal Lower (legs, knees, ankles, toes): None, normal, Trunk Movements Neck, shoulders, hips: None, normal, Overall Severity Severity of abnormal movements (highest score from questions above): None, normal Incapacitation due to abnormal movements: None, normal Patient's awareness of abnormal movements (rate  only patient's report): No Awareness, Dental Status Current problems with teeth and/or dentures?: No Does patient usually wear dentures?: No  CIWA:    COWS:     Musculoskeletal: Strength & Muscle Tone: within normal limits Gait & Station: normal Patient leans: N/A  Psychiatric Specialty Exam: Physical Exam Vitals and nursing note reviewed.  HENT:     Head: Normocephalic.     Nose: Nose normal.     Mouth/Throat:     Pharynx: Oropharynx is clear.  Eyes:     Pupils: Pupils are equal, round, and reactive to light.  Cardiovascular:     Rate and Rhythm: Normal rate.  Pulmonary:     Effort: Pulmonary effort is normal.  Genitourinary:    Comments: Deferred Musculoskeletal:        General: Normal range of motion.     Cervical back: Normal range of motion.  Skin:    General: Skin is warm and dry.  Neurological:     Mental Status: She is alert and oriented to person, place, and time.     Review of Systems  Constitutional: Negative for chills, diaphoresis and fever.  HENT: Negative for congestion, rhinorrhea, sneezing and sore throat.   Eyes: Negative for discharge.  Respiratory: Negative for cough, chest tightness, shortness of breath and wheezing.   Cardiovascular: Negative for chest pain and palpitations.  Gastrointestinal: Negative for diarrhea, nausea and  vomiting.  Endocrine: Negative for cold intolerance.  Genitourinary: Negative for difficulty urinating.  Musculoskeletal: Negative for arthralgias and myalgias.  Skin: Negative.   Allergic/Immunologic: Negative for environmental allergies and food allergies.       Allergies: Ace inhibitors, Lisinopril  Neurological: Negative for dizziness, tremors, seizures, syncope, light-headedness and headaches.  Psychiatric/Behavioral: Positive for dysphoric mood ("Improving"). Negative for agitation, behavioral problems, confusion, decreased concentration, hallucinations, self-injury, sleep disturbance and suicidal ideas. The patient  is nervous/anxious ("Improving"). The patient is not hyperactive.     Blood pressure 128/89, pulse 98, temperature 97.9 F (36.6 C), temperature source Oral, resp. rate 16, height 5\' 4"  (1.626 m), weight 80.7 kg, last menstrual period 06/23/2020, SpO2 100 %.Body mass index is 30.55 kg/m.  General Appearance:Casual  Eye Contact:Good  Speech:Normal Rate  Volume:Decreased  Mood:Depressed, but improving.  Affect:Appropriate  Thought Process:Coherent and Descriptions of Associations:Intact  Orientation:Full (Time, Place, and Person)  Thought Content:Logical  Suicidal Thoughts:Denies any thoughts, plans or intent.  Homicidal Thoughts:No  Memory:Immediate;Good Recent;Good Remote;Good  Judgement:Intact  Insight:Fair  Psychomotor Activity:Decreased  Concentration:Concentration:[place] Attention Span: [place] of Knowledge:Good  Language:Good  Akathisia:Negative  Handed:Right  AIMS (if indicated):   Assets:Desire for Improvement Resilience  ADL's:Intact  Cognition:WNL    Sleep:  Number of Hours: 6.25   Treatment Plan Summary: Daily contact with patient to assess and evaluate symptoms and progress in treatment and Medication management.  - Continue inpatient hospitalization. - Will continue today 06/25/2020 plan as below except where it is noted.  Depression.     - Continue Prozac 10 mg po daily.  Anxiety.     - Continue Vistaril 25 mg po tid prn.  Insomnia.     - Continue Trazodone 50 mg po Q hs prn.  Other medical issues.     - Continue Losartan 25 mg po daily for HTN.     - Continue Thiamine 100 mg po daily for thiamine replacement.     - Continue Folic acid 1 mg po daily for Folate deficiency.     - Continue Ferrous sulfate 325 mg po daily for anemia.     - Continue Protonix 40 mg po Q am for GERD.  Encourage group participation. Discharge disposition plan in progress.  Lindell Spar, NP,  PMHNP, FNP-BC 06/25/2020, 2:51 PM

## 2020-06-25 NOTE — Plan of Care (Addendum)
  Problem: Education: Goal: Knowledge of [facility] Education information/materials will improve Outcome: Progressing Goal: Emotional status will improve Outcome: Progressing  D: Patient is attending group activities in the milieu. She denies any thoughts of self harm. She does not appear to be responding to internal stimuli. Patient is compliant with medications. Will continue to monitor and assess. She rates her depression and anxiety as a 5; her hopelessness is rated as 6. Her goal is to "stay clean." Withdrawal symptoms are minimal today.   A: Continue to monitor medication management and MD orders.  Safety checks completed every 15 minutes per protocol.  Offer support and encouragement as needed.  R: Patient is receptive to staff; her behavior is appropriate.

## 2020-06-25 NOTE — [facility] Group Notes (Signed)
Date:  06/25/2020 Time:  9:00am-9:45am  Group Topic/Focus: PROGRESSIVE RELAXATION. A group where deep breathing is taught and tensing and relaxation muscle groups is used. Imagery is used as well.  Pts are asked to imagine 3 pillars that hold them up when they are not able to hold themselves up.  Participation Level:  Active  Participation Quality:  Appropriate  Affect:  Appropriate  Cognitive:  Oriented  Insight: Improving  Engagement in Group:  Engaged  Modes of Intervention:  Activity, Discussion, Education, and Support  Additional Comments:  Rates her energy level at a 3 and states when she is not able to hold herself up, love and her kids do.  Paulino Rily 06/25/2020

## 2020-06-25 NOTE — Progress Notes (Signed)
   06/25/20 0900  Psych Admission Type (Psych Patients Only)  Admission Status Voluntary  Psychosocial Assessment  Patient Complaints Anxiety  Eye Contact Brief  Facial Expression Anxious  Affect Appropriate to circumstance  Speech Logical/coherent  Interaction Minimal  Motor Activity Lethargic  Appearance/Hygiene Unremarkable  Behavior Characteristics Appropriate to situation  Mood Anxious  Thought Process  Coherency WDL  Content WDL  Delusions None reported or observed  Perception WDL  Hallucination None reported or observed  Judgment Poor  Confusion None  Danger to Self  Current suicidal ideation? Denies  Danger to Others  Danger to Others None reported or observed

## 2020-06-25 NOTE — Progress Notes (Signed)
   06/25/20 0947  COVID-19 Daily Checkoff  Have you had a fever (temp > 37.80C/100F)  in the past 24 hours?  No  If you have had runny nose, nasal congestion, sneezing in the past 24 hours, has it worsened? No  COVID-19 EXPOSURE  Have you traveled outside the state in the past 14 days? No  Have you been in contact with someone with a confirmed diagnosis of COVID-19 or PUI in the past 14 days without wearing appropriate PPE? No  Have you been living in the same home as a person with confirmed diagnosis of COVID-19 or a PUI (household contact)? No  Have you been diagnosed with COVID-19? No

## 2020-06-25 NOTE — [facility] Group Notes (Signed)
[facility] LCSW Group Therapy Note  06/25/2020    Type of Therapy and Topic:  Group Therapy:  Adding Supports Including Yourself  Participation Level:  Active   Description of Group:   Patients in this group were introduced to the concept that additional supports including self-support are an essential part of recovery.  Patients listed what supports they believe they need to add to their lives to achieve their goals at discharge, and they listed such things as therapist, family, doctor, support groups, 12-step groups and service animals.   A song entitled "My Own Hero" was played and a group discussion ensued in which patients stated they could relate to the song and it inspired them to realize they have be willing to help themselves in order to succeed, because other people cannot achieve sobriety or stability for them.  "Fight For It" was played, then "I Am Enough" to encourage patients.  They discussed the impact on them and how they must remain convinced that their lives are worth the effort it takes to become sober and/or stable.  Therapeutic Goals: 1)  demonstrate the importance of being a key part of one's own support system 2)  discuss various available supports 3)  encourage patient to use music as part of their self-support and focus on goals 4)  elicit ideas from patients about supports that need to be added   Summary of Patient Progress:  The patient expressed that her job, family, and some church members are healthy supports to her, while her neighbors, her ex-significant other, and some family members are unhealthy for her.  She was receptive to the ideas shared throughout group.  She was out of group for awhile seeing a provider, but returned and was receptive to the music.   Therapeutic Modalities:   Motivational Interviewing Activity  [place] Hun Grossman-Orr  8:24 AM
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
## 2020-06-25 NOTE — Progress Notes (Signed)
   06/25/20 2231  COVID-19 Daily Checkoff  Have you had a fever (temp > 37.80C/100F)  in the past 24 hours?  No  If you have had runny nose, nasal congestion, sneezing in the past 24 hours, has it worsened? No  COVID-19 EXPOSURE  Have you traveled outside the state in the past 14 days? No  Have you been in contact with someone with a confirmed diagnosis of COVID-19 or PUI in the past 14 days without wearing appropriate PPE? No  Have you been living in the same home as a person with confirmed diagnosis of COVID-19 or a PUI (household contact)? No  Have you been diagnosed with COVID-19? No

## 2020-06-26 DIAGNOSIS — F332 Major depressive disorder, recurrent severe without psychotic features: Secondary | ICD-10-CM | POA: Diagnosis not present

## 2020-06-26 LAB — URINALYSIS, COMPLETE (UACMP) WITH MICROSCOPIC
Bacteria, UA: NONE SEEN
Bilirubin Urine: NEGATIVE
Glucose, UA: NEGATIVE mg/dL
Ketones, ur: NEGATIVE mg/dL
Leukocytes,Ua: NEGATIVE
Nitrite: NEGATIVE
Protein, ur: NEGATIVE mg/dL
Specific Gravity, Urine: 1.014 (ref 1.005–1.030)
pH: 5 (ref 5.0–8.0)

## 2020-06-26 LAB — HEMOGLOBIN A1C
Hgb A1c MFr Bld: 5.1 % (ref 4.8–5.6)
Mean Plasma Glucose: 100 mg/dL

## 2020-06-26 MED ORDER — HYDROXYZINE HCL 25 MG PO TABS: 25.0000 mg | ORAL_TABLET | Freq: Three times a day (TID) | ORAL | 0 refills | Status: DC | PRN

## 2020-06-26 MED ORDER — ONDANSETRON HCL 4 MG PO TABS: 8.0000 mg | ORAL_TABLET | Freq: Three times a day (TID) | ORAL | Status: DC | PRN

## 2020-06-26 MED ORDER — PANTOPRAZOLE SODIUM 40 MG PO TBEC: 40.0000 mg | DELAYED_RELEASE_TABLET | Freq: Every day | ORAL | 0 refills | Status: DC

## 2020-06-26 MED ORDER — FLUOXETINE HCL 10 MG PO CAPS: 10.0000 mg | ORAL_CAPSULE | Freq: Every day | ORAL | 0 refills | Status: DC

## 2020-06-26 NOTE — BHH Group Notes (Signed)
Occupational Therapy Group Note Date: 06/26/2020 Group Topic/Focus: Communication Skills  Group Description: Group encouraged increased engagement and participation through discussion focused on communication styles. Patients were educated on the different styles of communication including passive, aggressive, assertive, and passive-aggressive communication. Group members shared and reflected on which styles they most often find themselves communicating in and brainstormed strategies on how to transition and practice a more assertive approach. Further discussion explored how to use assertiveness skills and strategies to further advocate and ask questions as it relates to their treatment plan and mental health.  Participation Level: Active   Participation Quality: Independent   Behavior: Calm and Cooperative   Speech/Thought Process: Focused   Affect/Mood: Euthymic   Insight: Fair   Judgement: Fair   Individualization: Marie Atkins was active and independent in her participation of discussion, sharing that she falls in between the different styles of communication. Pt actively practiced different scenarios and recognized that she falls in the category of being passive-aggressive. Pt identified benefit of education provided and expressed desire to be more assertive in many aspects of her life, both personally and at work.   Modes of Intervention: Discussion, Education, Role-play and Socialization  Patient Response to Interventions:  Attentive, Engaged, Receptive and Interested   Plan: Continue to engage patient in OT groups 2 - 3x/week.  06/26/2020  Marie Atkins, MOT, OTR/L

## 2020-06-26 NOTE — Tx Team (Signed)
Interdisciplinary Treatment and Diagnostic Plan Update  06/26/2020 Time of Session: 9:20am Marie Atkins MRN: [id number]  Principal Diagnosis: Severe recurrent major depression without psychotic features [facility])  Secondary Diagnoses: Principal Problem:   Severe recurrent major depression without psychotic features ([place])   Current Medications:  Current Facility-Administered Medications  Medication Dose Route Frequency Provider Last Rate Last Admin  . acetaminophen (TYLENOL) tablet 650 mg  650 mg Oral Q6H PRN Lindon Romp A, NP      . alum & mag hydroxide-simeth (MAALOX/MYLANTA) 200-200-20 MG/5ML suspension 30 mL  30 mL Oral Q4H PRN Lindon Romp A, NP      . ferrous sulfate tablet 325 mg  325 mg Oral Daily Lindon Romp A, NP   325 mg at 06/26/20 6659  . folic acid (FOLVITE) tablet 1 mg  1 mg Oral Daily Sharma Covert, MD   1 mg at 06/26/20 9357  . hydrOXYzine (ATARAX/VISTARIL) tablet 25 mg  25 mg Oral TID PRN Rozetta Nunnery, NP   25 mg at 06/25/20 2123  . losartan (COZAAR) tablet 25 mg  25 mg Oral Daily Sharma Covert, MD   25 mg at 06/26/20 0177  . magnesium hydroxide (MILK OF MAGNESIA) suspension 30 mL  30 mL Oral Daily PRN Lindon Romp A, NP   30 mL at 06/26/20 0825  . neomycin-bacitracin-polymyxin (NEOSPORIN) ointment   Topical PRN Caroline Sauger, NP      . ondansetron [facility] tablet 8 mg  8 mg Oral Q8H PRN Sharma Covert, MD      . pantoprazole (PROTONIX) EC tablet 40 mg  40 mg Oral Daily Sharma Covert, MD   40 mg at 06/26/20 9390  . thiamine tablet 100 mg  100 mg Oral Daily Sharma Covert, MD   100 mg at 06/26/20 3009  . traZODone (DESYREL) tablet 50 mg  50 mg Oral QHS PRN Rozetta Nunnery, NP   50 mg at 06/25/20 2124   PTA Medications: Medications Prior to Admission  Medication Sig Dispense Refill Last Dose  . ferrous sulfate 325 (65 FE) MG tablet Take 1 tablet (325 mg total) by mouth daily. 30 tablet 2   . losartan (COZAAR) 50 MG tablet Take 1 tablet (50 mg  total) by mouth daily. 60 tablet 4   . [DISCONTINUED] FLUoxetine (PROZAC) 10 MG capsule Take 1 capsule (10 mg total) by mouth daily. 30 capsule 2     Patient Stressors: Financial difficulties  Patient Strengths: Capable of independent living Agricultural engineer for treatment/growth Supportive family/friends  Treatment Modalities: Medication Management, Group therapy, Case management,  1 to 1 session with clinician, Psychoeducation, Recreational therapy.   Physician Treatment Plan for Primary Diagnosis: Severe recurrent major depression without psychotic features ([place]) Long Term Goal(s): Improvement in symptoms so as ready for discharge Improvement in symptoms so as ready for discharge   Short Term Goals: Ability to identify changes in lifestyle to reduce recurrence of condition will improve Ability to verbalize feelings will improve Ability to disclose and discuss suicidal ideas Ability to demonstrate self-control will improve Ability to identify and develop effective coping behaviors will improve Compliance with prescribed medications will improve Ability to identify triggers associated with substance abuse/mental health issues will improve  Medication Management: Evaluate patient's response, side effects, and tolerance of medication regimen.  Therapeutic Interventions: 1 to 1 sessions, Unit Group sessions and Medication administration.  Evaluation of Outcomes: Progressing  Physician Treatment Plan for Secondary Diagnosis: Principal Problem:   Severe recurrent major depression  without psychotic features ([place])  Long Term Goal(s): Improvement in symptoms so as ready for discharge Improvement in symptoms so as ready for discharge   Short Term Goals: Ability to identify changes in lifestyle to reduce recurrence of condition will improve Ability to verbalize feelings will improve Ability to disclose and discuss suicidal ideas Ability to demonstrate self-control will  improve Ability to identify and develop effective coping behaviors will improve Compliance with prescribed medications will improve Ability to identify triggers associated with substance abuse/mental health issues will improve     Medication Management: Evaluate patient's response, side effects, and tolerance of medication regimen.  Therapeutic Interventions: 1 to 1 sessions, Unit Group sessions and Medication administration.  Evaluation of Outcomes: Progressing   RN Treatment Plan for Primary Diagnosis: Severe recurrent major depression without psychotic features ([place]) Long Term Goal(s): Knowledge of disease and therapeutic regimen to maintain health will improve  Short Term Goals: Ability to remain free from injury will improve, Ability to verbalize frustration and anger appropriately will improve, Ability to participate in decision making will improve, Ability to identify and develop effective coping behaviors will improve and Compliance with prescribed medications will improve  Medication Management: RN will administer medications as ordered by provider, will assess and evaluate patient's response and provide education to patient for prescribed medication. RN will report any adverse and/or side effects to prescribing provider.  Therapeutic Interventions: 1 on 1 counseling sessions, Psychoeducation, Medication administration, Evaluate responses to treatment, Monitor vital signs and CBGs as ordered, Perform/monitor CIWA, COWS, AIMS and Fall Risk screenings as ordered, Perform wound care treatments as ordered.  Evaluation of Outcomes: Progressing   LCSW Treatment Plan for Primary Diagnosis: Severe recurrent major depression without psychotic features ([place]) Long Term Goal(s): Safe transition to appropriate next level of care at discharge, Engage patient in therapeutic group addressing interpersonal concerns.  Short Term Goals: Engage patient in aftercare planning with referrals and  resources, Increase social support, Facilitate patient progression through stages of change regarding substance use diagnoses and concerns, Identify triggers associated with mental health/substance abuse issues and Increase skills for wellness and recovery  Therapeutic Interventions: Assess for all discharge needs, 1 to 1 time with Social worker, Explore available resources and support systems, Assess for adequacy in community support network, Educate family and significant other(s) on suicide prevention, Complete Psychosocial Assessment, Interpersonal group therapy.  Evaluation of Outcomes: Progressing   Progress in Treatment: Attending groups: Yes. Participating in groups: Yes. Taking medication as prescribed: Yes. Toleration medication: Yes. Family/Significant other contact made: No, will contact:  sister. Patient understands diagnosis: Yes. Discussing patient identified problems/goals with staff: Yes. Medical problems stabilized or resolved: Yes. Denies suicidal/homicidal ideation: Yes. Issues/concerns per patient self-inventory: No.   New problem(s) identified: No, Describe:  none.  New Short Term/Long Term Goal(s): medication stabilization, elimination of SI thoughts, development of comprehensive mental wellness plan.   Patient Goals:  "To get myself better, to go to a treatment center"  Discharge Plan or Barriers: Would like to transfer to residential substance use treatment   Reason for Continuation of Hospitalization: Anxiety Depression Medication stabilization  Estimated Length of Stay: 3-5 days  Attendees: Patient: Marie Atkins 06/26/2020  Physician: Myles Lipps, MD 06/26/2020  Nursing:  06/26/2020   RN Care Manager: 06/26/2020  Social Worker: Darletta Moll, [place] 06/26/2020   Recreational Therapist:  06/26/2020  Other:  06/26/2020  Other:  06/26/2020  Other: 06/26/2020       Scribe for Treatment Team: Vassie Moselle, [place] 06/26/2020 2:28 PM

## 2020-06-26 NOTE — Progress Notes (Signed)
Pt has been in the dayroom watching television pleasantly. Pt is looking forward to finding residential tx to help her stay clean from cocaine. Pt said that she has talked to the social worker about it and is waiting to hear back to see if any placement has been found. Pt denies SI/HI and AVH. Active listening, reassurance, and support provided. Q 15 min safety checks continue. Pt's safety has been maintained.     06/26/20 2020  Psych Admission Type (Psych Patients Only)  Admission Status Voluntary  Psychosocial Assessment  Patient Complaints Depression;Worrying  Eye Contact Fair  Facial Expression Worried  Affect Appropriate to circumstance  Speech Logical/coherent  Interaction Assertive  Motor Activity Other (Comment) (WNL)  Appearance/Hygiene Unremarkable  Behavior Characteristics Cooperative;Appropriate to situation;Calm  Mood Depressed;Pleasant  Thought Process  Coherency WDL  Content WDL  Delusions None reported or observed  Perception WDL  Hallucination None reported or observed  Judgment Poor  Confusion None  Danger to Self  Current suicidal ideation? Denies  Danger to Others  Danger to Others None reported or observed

## 2020-06-26 NOTE — Progress Notes (Signed)
[facility] MD Progress Note  06/26/2020 2:06 PM Marie Atkins  MRN:  [id number] Subjective:  Pt states she feels tired today. Pt rates her mood at 3-4/10 and states she feels depressed but its better than yesterday. Pt reports lightheadedness, headache and nausea. She says its possibly due to medication. Pt didn't sleep well last night. Nursing notes indicate that Pt slept fore 5.75 hours. Pt states her appetite is poor and she doesn't feel like eating much. Pt c/o mild constipation. Pt states her anxiety is much better and she rates it 0/10. Pt states she has tried Wellbutrin in the past but it had caused her increased sleepiness in the morning time. Pt states she has 4 kids who are all grown up and are good support system. Currently, Pt denies any suicidal ideation, homicidal ideation and, visual and auditory hallucination. Pt denies any vomiting, dizziness, chest pain, SOB, abdominal pain, diarrhea, and constipation. Pt denies any withdrawal symptoms.  On examination, Pt is depressed , cooperative and oriented x4. Pts speech is normal with low volume. Pts mood is depressed with constricted affect. 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  Principal Problem: Severe recurrent major depression without psychotic features ([place]) Diagnosis: Principal Problem:   Severe recurrent major depression without psychotic features ([place])  Total Time spent with patient: 20 minutes  Past Psychiatric History: see H&P  Past Medical History:  Past Medical History:  Diagnosis Date   Cocaine abuse ([place])    Depression    HTN (hypertension)    Iron deficiency anemia    History reviewed. No pertinent surgical history. Family History:  Family  History  Problem Relation Age of Onset   Hypertension Mother    Diabetes Mother    CVA Mother    Heart attack Mother    Colon cancer Maternal Uncle    Family Psychiatric  History: see H&P Social History:  Social History   Substance and Sexual Activity  Alcohol Use Yes   Comment: "sometimes every other day," "1 to 2 cans of beer or a glass or two of wine"     Social History   Substance and Sexual Activity  Drug Use Yes   Types: Cocaine   Comment: 3 to 4 times a week, last use on Thursday (06/22/2020)    Social History   Socioeconomic History   Marital status: Single    Spouse name: Not on file   Number of children: Not on file   Years of education: Not on file   Highest education level: Not on file  Occupational History   Not on file  Tobacco Use   Smoking status: Current Every Day Smoker    Types: Cigarettes   Smokeless tobacco: Never Used   Tobacco comment: 10 cigarettes/day  Vaping Use   Vaping Use: Never used  Substance and Sexual Activity   Alcohol use: Yes    Comment: "sometimes every other day," "1 to 2 cans of beer or a glass or two of wine"   Drug use: Yes    Types: Cocaine    Comment: 3 to 4 times a week, last use on Thursday (06/22/2020)   Sexual activity: Not Currently  Other Topics Concern   Not on file  Social History Narrative  Not on file   Social Determinants of Health   Financial Resource Strain:    Difficulty of Paying Living Expenses:   Food Insecurity:    Worried About Charity fundraiser in the Last Year:    Arboriculturist in the Last Year:   Transportation Needs:    Film/video editor (Medical):    Lack of Transportation (Non-Medical):   Physical Activity:    Days of Exercise per Week:    Minutes of Exercise per Session:   Stress:    Feeling of Stress :   Social Connections:    Frequency of Communication with Friends and Family:    Frequency of Social Gatherings with Friends and Family:     Attends Religious Services:    Active Member of Clubs or Organizations:    Attends Archivist Meetings:    Marital Status:    Additional Social History:                         Sleep: Poor  Appetite:  Poor  Current Medications: Current Facility-Administered Medications  Medication Dose Route Frequency Provider Last Rate Last Admin   acetaminophen (TYLENOL) tablet 650 mg  650 mg Oral Q6H PRN Rozetta Nunnery, NP       alum & mag hydroxide-simeth (MAALOX/MYLANTA) 200-200-20 MG/5ML suspension 30 mL  30 mL Oral Q4H PRN Lindon Romp A, NP       ferrous sulfate tablet 325 mg  325 mg Oral Daily Lindon Romp A, NP   325 mg at 45/62/56 3893   folic acid (FOLVITE) tablet 1 mg  1 mg Oral Daily Sharma Covert, MD   1 mg at 06/26/20 7342   hydrOXYzine (ATARAX/VISTARIL) tablet 25 mg  25 mg Oral TID PRN Rozetta Nunnery, NP   25 mg at 06/25/20 2123   losartan (COZAAR) tablet 25 mg  25 mg Oral Daily Sharma Covert, MD   25 mg at 06/26/20 8768   magnesium hydroxide (MILK OF MAGNESIA) suspension 30 mL  30 mL Oral Daily PRN Rozetta Nunnery, NP   30 mL at 06/26/20 0825   neomycin-bacitracin-polymyxin (NEOSPORIN) ointment   Topical PRN Caroline Sauger, NP       ondansetron [facility]) tablet 8 mg  8 mg Oral Q8H PRN Sharma Covert, MD       pantoprazole (PROTONIX) EC tablet 40 mg  40 mg Oral Daily Sharma Covert, MD   40 mg at 06/26/20 1157   thiamine tablet 100 mg  100 mg Oral Daily Sharma Covert, MD   100 mg at 06/26/20 2620   traZODone (DESYREL) tablet 50 mg  50 mg Oral QHS PRN Rozetta Nunnery, NP   50 mg at 06/25/20 2124    Lab Results:  Results for orders placed or performed during the hospital encounter of 06/24/20 (from the past 48 hour(s))  Lipid panel     Status: None   Collection Time: 06/24/20  5:42 PM  Result Value Ref Range   Cholesterol 166 0 - 200 mg/dL   Triglycerides 87 <150 mg/dL   HDL 69 >40 mg/dL   Total CHOL/HDL Ratio 2.4 RATIO    VLDL 17 0 - 40 mg/dL   LDL Cholesterol 80 0 - 99 mg/dL    Comment:        Total Cholesterol/HDL:CHD Risk Coronary Heart Disease Risk Table  Men   Women  1/2 Average Risk   3.4   3.3  Average Risk       5.0   4.4  2 X Average Risk   9.6   7.1  3 X Average Risk  23.4   11.0        Use the calculated Patient Ratio above and the CHD Risk Table to determine the patient's CHD Risk.        ATP III CLASSIFICATION (LDL):  <100     mg/dL   Optimal  100-129  mg/dL   Near or Above                    Optimal  130-159  mg/dL   Borderline  160-189  mg/dL   High  >190     mg/dL   Very High Performed at [facility] [address] [place], [place] [address]   TSH     Status: None   Collection Time: 06/24/20  5:42 PM  Result Value Ref Range   TSH 1.385 0.350 - 4.500 uIU/mL    Comment: Performed by a 3rd Generation assay with a functional sensitivity of <=0.01 uIU/mL. Performed at [facility], [place] [address], [place], [place] [address]     Blood Alcohol level:  Lab Results  Component Value Date   ETH <10 06/23/2020   ETH <10 09/81/1914    Metabolic Disorder Labs: No results found for: HGBA1C, MPG No results found for: PROLACTIN Lab Results  Component Value Date   CHOL 166 06/24/2020   TRIG 87 06/24/2020   HDL 69 06/24/2020   CHOLHDL 2.4 06/24/2020   VLDL 17 06/24/2020   LDLCALC 80 06/24/2020    Physical Findings: AIMS: Facial and Oral Movements Muscles of Facial Expression: None, normal Lips and Perioral Area: None, normal Jaw: None, normal Tongue: None, normal,Extremity Movements Upper (arms, wrists, hands, fingers): None, normal Lower (legs, knees, ankles, toes): None, normal, Trunk Movements Neck, shoulders, hips: None, normal, Overall Severity Severity of abnormal movements (highest score from questions above): None, normal Incapacitation due to abnormal movements: None, normal Patient's awareness of  abnormal movements (rate only patient's report): No Awareness, Dental Status Current problems with teeth and/or dentures?: No Does patient usually wear dentures?: No  CIWA:    COWS:     Musculoskeletal: Strength & Muscle Tone: within normal limits Gait & Station: normal Patient leans: N/A  Psychiatric Specialty Exam: Physical Exam Vitals and nursing note reviewed.  Constitutional:      General: She is not in acute distress.    Appearance: Normal appearance. She is not ill-appearing, toxic-appearing or diaphoretic.  HENT:     Head: Normocephalic and atraumatic.  Pulmonary:     Effort: Pulmonary effort is normal.  Neurological:     Mental Status: She is alert.     Review of Systems  Constitutional: Positive for appetite change and fatigue. Negative for chills and fever.  Respiratory: Negative for chest tightness and shortness of breath.   Cardiovascular: Negative for chest pain and palpitations.  Gastrointestinal: Positive for constipation and nausea. Negative for abdominal pain and vomiting.  Neurological: Positive for light-headedness and headaches.  Psychiatric/Behavioral: Positive for dysphoric mood. Negative for hallucinations and suicidal ideas.    Blood pressure 124/83, pulse 83, temperature 97.9 F (36.6 C), temperature source Oral, resp. rate 16, height 5\' 4"  (1.626 m), weight 80.7 kg, last menstrual period 06/23/2020, SpO2 100 %.Body mass index is 30.55 kg/m.  General Appearance: Disheveled  Eye Contact:  Minimal  Speech:  Normal Rate  Volume:  Decreased  Mood:  Depressed  Affect:  Depressed  Thought Process:  Linear and Descriptions of Associations: Intact  Orientation:  Full (Time, Place, and Person)  Thought Content:  Logical  Suicidal Thoughts:  No  Homicidal Thoughts:  No  Memory:  Immediate;   Good Recent;   Good Remote;   Good  Judgement:  Intact  Insight:  Fair  Psychomotor Activity:  Decreased  Concentration:  Concentration: Fair and Attention  Span: Fair  Recall:  [employer] of Knowledge:  Good  Language:  Fair  Akathisia:  Negative  Handed:  Right  AIMS (if indicated):     Assets:  Desire for Improvement Resilience  ADL's:  Intact  Cognition:  WNL  Sleep:  Number of Hours: 5.75     Treatment Plan Summary: Patient is a 29-year female with a past psychiatric history significant for 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 Today on examination, Pt is depressed , cooperative and oriented x4. Pts speech is normal with low volume. Pts mood is depressed with constricted affect. No SI, HI or AVH BP- 124/83 mmHg, PR- 83/min Labs- Cholesterol 166, HDL- 69, Triglyceride-87, VLDL- 17, TSH- 1.385, HCg - Negative 06/23/20- Hb -10.3, WBC- 12.8, RDW- 16, Glucose- 120, Creatinine- 1.09, Na- 141, K- 4.0 UDS- positive for cocaine EKG- Normal Sinus Rhythm, Possible Left atrial Enlargement Borderline ECG QTc- 362/457  Plan-  Daily contact with patient to assess and evaluate symptoms and progress in treatment -Monitor Vitals. -Monitor for Suicidal Ideation. -Monitor for withdrawal symptoms. -Monitor for medication side effects. -Discontinue Prozac because of side effects.  -Continue Fe Sulphate 325 mg Daily -Continue Folic acid 1 mg Daily -Continue Cozaar 25 mg daily -Continue Milk of Magnesia 30 ml PRN for mild constipation -Continue Neosporin for wound care as needed -Continue Zofran Q 8H for Nausea, vomiting PRN -Continue Thiamine 100 mg Daily -Continue Hydroxyzine 25 mg TID PRN for Anxiety. -Continue Trazodone 50 mg QHS PRN for sleep.  Armando Reichert, MD 06/26/2020, 2:06 PM

## 2020-06-26 NOTE — Progress Notes (Signed)
Pt did not attend wrap-up group   

## 2020-06-26 NOTE — Progress Notes (Signed)
D:  Patient denied SI and HI, contracts for safety.  Denied A/V hallucinations.   A:  Medications administered per MD orders.  Emotional support and encouragement given patient. R:  Safety maintained with 15 minute checks. Patient has been very cooperative and pleasant today.

## 2020-06-26 NOTE — Progress Notes (Signed)
   06/26/20 2020  COVID-19 Daily Checkoff  Have you had a fever (temp > 37.80C/100F)  in the past 24 hours?  No  COVID-19 EXPOSURE  Have you traveled outside the state in the past 14 days? No  Have you been in contact with someone with a confirmed diagnosis of COVID-19 or PUI in the past 14 days without wearing appropriate PPE? No  Have you been living in the same home as a person with confirmed diagnosis of COVID-19 or a PUI (household contact)? No  Have you been diagnosed with COVID-19? No

## 2020-06-26 NOTE — Plan of Care (Signed)
Nurse discussed anxiety, depression and coping skills with patient.  

## 2020-06-26 NOTE — BHH Counselor (Signed)
CSW faxed referral to [facility] at [phone], [facility] at  [phone], and [facility] [phone].

## 2020-06-26 NOTE — BHH Suicide Risk Assessment (Deleted)
[facility] Discharge Suicide Risk Assessment   Principal Problem: Severe recurrent major depression without psychotic features [facility]) Discharge Diagnoses: Principal Problem:   Severe recurrent major depression without psychotic features ([place])   Total Time spent with patient: 15 minutes  Musculoskeletal: Strength & Muscle Tone: within normal limits Gait & Station: normal Patient leans: N/A  Psychiatric Specialty Exam: Review of Systems  All other systems reviewed and are negative.   Blood pressure 124/83, pulse 83, temperature 97.9 F (36.6 C), temperature source Oral, resp. rate 16, height 5\' 4"  (1.626 m), weight 80.7 kg, last menstrual period 06/23/2020, SpO2 100 %.Body mass index is 30.55 kg/m.  General Appearance: Casual  Eye Contact::  Fair  Speech:  Normal Rate409  Volume:  Normal  Mood:  Euthymic  Affect:  Congruent  Thought Process:  Coherent and Descriptions of Associations: Intact  Orientation:  Full (Time, Place, and Person)  Thought Content:  Logical  Suicidal Thoughts:  No  Homicidal Thoughts:  No  Memory:  Immediate;   Good Recent;   Good Remote;   Good  Judgement:  Intact  Insight:  Fair  Psychomotor Activity:  Normal  Concentration:  Fair  Recall:  [place] of Knowledge:Good  Language: Good  Akathisia:  Negative  Handed:  Right  AIMS (if indicated):     Assets:  Desire for Improvement Housing Resilience  Sleep:  Number of Hours: 5.75  Cognition: WNL  ADL's:  Intact   Mental Status Per Nursing Assessment::   On Admission:  Suicidal ideation indicated by patient  Demographic Factors:  Divorced or widowed, Low socioeconomic status and Unemployed  Loss Factors: Financial problems/change in socioeconomic status  Historical Factors: Impulsivity  Risk Reduction Factors:   Positive social support  Continued Clinical Symptoms:  Depression:   Comorbid alcohol abuse/dependence Impulsivity Alcohol/Substance Abuse/Dependencies  Cognitive Features  That Contribute To Risk:  None    Suicide Risk:  Minimal: No identifiable suicidal ideation.  Patients presenting with no risk factors but with morbid ruminations; may be classified as minimal risk based on the severity of the depressive symptoms    Plan Of Care/Follow-up recommendations:  Activity:  ad lib  Sharma Covert, MD 06/26/2020, 11:02 AM

## 2020-06-27 DIAGNOSIS — F332 Major depressive disorder, recurrent severe without psychotic features: Secondary | ICD-10-CM | POA: Diagnosis not present

## 2020-06-27 MED ORDER — MIRTAZAPINE 15 MG PO TABS
15.0000 mg | ORAL_TABLET | Freq: Every day | ORAL | Status: DC
  Filled 2020-06-27 (×3): qty 1

## 2020-06-27 MED ORDER — DOCUSATE SODIUM 100 MG PO CAPS: 100.0000 mg | ORAL_CAPSULE | Freq: Every day | ORAL | Status: DC | PRN

## 2020-06-27 NOTE — BHH Suicide Risk Assessment (Signed)
[place] INPATIENT:  Family/Significant Other Suicide Prevention Education  Suicide Prevention Education:  Education Completed; sister Dorene Grebe [phone],  (name of family member/significant other) has been identified by the patient as the family member/significant other with whom the patient will be residing, and identified as the person(s) who will aid the patient in the event of a mental health crisis (suicidal ideations/suicide attempt).  With written consent from the patient, the family member/significant other has been provided the following suicide prevention education, prior to the and/or following the discharge of the patient.  The suicide prevention education provided includes the following:  Suicide risk factors  Suicide prevention and interventions  National Suicide Hotline telephone number  [facility] assessment telephone number  [facility] Emergency Assistance [place] and/or Residential Mobile Crisis Unit telephone number  Request made of family/significant other to:  Remove weapons (e.g., guns, rifles, knives), all items previously/currently identified as safety concern.    Remove drugs/medications (over-the-counter, prescriptions, illicit drugs), all items previously/currently identified as a safety concern.  The family member/significant other verbalizes understanding of the suicide prevention education information provided.  The family member/significant other agrees to remove the items of safety concern listed above.  Bethann Berkshire 06/27/2020, 1:47 PM

## 2020-06-27 NOTE — BHH Counselor (Signed)
CSW followed up with tx centers.   [facility] informed CSW that they likely won't have bed until 8/24 but that date is not definitive.   CS[facility] called [facility]. [facility] informed they did not have referral. CSW refaxed [phone]  CSW called [facility]. Admissions informed pt is clinically appropriate. CSW provided insurance info to run benefits. Pt to call admissions for pre screen at [phone]

## 2020-06-27 NOTE — Progress Notes (Signed)
Adult Psychoeducational Group Note  Date:  06/27/2020 Time:  4:57 AM  Group Topic/Focus:  Wrap-Up Group:   The focus of this group is to help patients review their daily goal of treatment and discuss progress on daily workbooks.  Participation Level:  Active  Participation Quality:  Appropriate  Affect:  Appropriate  Cognitive:  Appropriate  Insight: Appropriate  Engagement in Group:  Engaged  Modes of Intervention:  Discussion  Additional Comments:  Pt attend wrap up group her day was a 7. Her goal trying to feel better and find a 28 day program. Pt did not achieve her goal. Her coping skills her concerns not being able to find rehab before getting discharged.  Lenice Llamas Long 06/27/2020, 4:57 AM

## 2020-06-27 NOTE — Progress Notes (Signed)
[facility] MD Progress Note  06/27/2020 9:39 AM Marie Atkins  MRN:  [id number] Subjective:  Pt states she feels very irritable today . Pt rates her mood at 5/10 and states she still feels depressed. Pt reports dizziness, lightheadedness, and mild headache. Pt states she slept ok last night. Nursing notes indicate that Pt slept fore 6 hours last night. Pt c/o constipation, states Milk of mg is not helping and she needs something stronger. Pt states her appetite is still poor and she doesn't feel like eating much. . Pt states she feels very jittery and nervous today and rates her anxiety at 4/10. Pt states she has tried Lexopro  In the past and it doesn't help. Currently,Pt denies any suicidal ideation, homicidal ideation and, visual and auditory hallucination. Pt denies any nausea, vomiting, dizziness, chest pain, SOB, abdominal pain, diarrhea, and constipation. Pt denies any other side effects from medication. On examination, Pt is depressed , anxious, cooperative and oriented x4. Pt's speech is normal with low volume. Pt's mood is depressed and anxious with constricted affect. 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 Principal Problem: Severe recurrent major depression without psychotic features ([place]) Diagnosis: Principal Problem:   Severe recurrent major depression without psychotic features ([place])  Total Time spent with patient: 20 minutes  Past Psychiatric History: see H&P  Past Medical History:  Past Medical History:  Diagnosis Date  . Cocaine abuse ([place])   . Depression   . HTN (hypertension)   . Iron deficiency anemia    History reviewed. No pertinent surgical history. Family History:  Family History  Problem  Relation Age of Onset  . Hypertension Mother   . Diabetes Mother   . CVA Mother   . Heart attack Mother   . Colon cancer Maternal Uncle    Family Psychiatric  History: see H&P Social History:  Social History   Substance and Sexual Activity  Alcohol Use Yes   Comment: "sometimes every other day," "1 to 2 cans of beer or a glass or two of wine"     Social History   Substance and Sexual Activity  Drug Use Yes  . Types: Cocaine   Comment: 3 to 4 times a week, last use on Thursday (06/22/2020)    Social History   Socioeconomic History  . Marital status: Single    Spouse name: Not on file  . Number of children: Not on file  . Years of education: Not on file  . Highest education level: Not on file  Occupational History  . Not on file  Tobacco Use  . Smoking status: Current Every Day Smoker    Types: Cigarettes  . Smokeless tobacco: Never Used  . Tobacco comment: 10 cigarettes/day  Vaping Use  . Vaping Use: Never used  Substance and Sexual Activity  . Alcohol use: Yes    Comment: "sometimes every other day," "1 to 2 cans of beer or a glass or two of wine"  . Drug use: Yes    Types: Cocaine    Comment: 3 to 4 times a week, last use on Thursday (06/22/2020)  . Sexual activity: Not Currently  Other Topics Concern  . Not on file  Social History Narrative  . Not on file   Social Determinants  of Health   Financial Resource Strain:   . Difficulty of Paying Living Expenses:   Food Insecurity:   . Worried About Charity fundraiser in the Last Year:   . Arboriculturist in the Last Year:   Transportation Needs:   . Film/video editor (Medical):   Marland Kitchen Lack of Transportation (Non-Medical):   Physical Activity:   . Days of Exercise per Week:   . Minutes of Exercise per Session:   Stress:   . Feeling of Stress :   Social Connections:   . Frequency of Communication with Friends and Family:   . Frequency of Social Gatherings with Friends and Family:   . Attends Religious  Services:   . Active Member of Clubs or Organizations:   . Attends Archivist Meetings:   Marland Kitchen Marital Status:    Additional Social History:                         Sleep: Fair  Appetite:  Fair  Current Medications: Current Facility-Administered Medications  Medication Dose Route Frequency Provider Last Rate Last Admin  . acetaminophen (TYLENOL) tablet 650 mg  650 mg Oral Q6H PRN Lindon Romp A, NP      . alum & mag hydroxide-simeth (MAALOX/MYLANTA) 200-200-20 MG/5ML suspension 30 mL  30 mL Oral Q4H PRN Lindon Romp A, NP      . ferrous sulfate tablet 325 mg  325 mg Oral Daily Lindon Romp A, NP   325 mg at 06/27/20 0804  . folic acid (FOLVITE) tablet 1 mg  1 mg Oral Daily Sharma Covert, MD   1 mg at 06/27/20 0804  . hydrOXYzine (ATARAX/VISTARIL) tablet 25 mg  25 mg Oral TID PRN Rozetta Nunnery, NP   25 mg at 06/25/20 2123  . losartan (COZAAR) tablet 25 mg  25 mg Oral Daily Sharma Covert, MD   25 mg at 06/27/20 0804  . magnesium hydroxide (MILK OF MAGNESIA) suspension 30 mL  30 mL Oral Daily PRN Lindon Romp A, NP   30 mL at 06/26/20 0825  . neomycin-bacitracin-polymyxin (NEOSPORIN) ointment   Topical PRN Caroline Sauger, NP      . ondansetron [facility]) tablet 8 mg  8 mg Oral Q8H PRN Sharma Covert, MD      . pantoprazole (PROTONIX) EC tablet 40 mg  40 mg Oral Daily Sharma Covert, MD   40 mg at 06/27/20 0804  . thiamine tablet 100 mg  100 mg Oral Daily Sharma Covert, MD   100 mg at 06/27/20 0804  . traZODone (DESYREL) tablet 50 mg  50 mg Oral QHS PRN Rozetta Nunnery, NP   50 mg at 06/25/20 2124    Lab Results:  Results for orders placed or performed during the hospital encounter of 06/24/20 (from the past 48 hour(s))  Urinalysis, Complete w Microscopic Urine, Random     Status: Abnormal   Collection Time: 06/26/20  5:36 PM  Result Value Ref Range   Color, Urine YELLOW YELLOW   APPearance CLEAR CLEAR   Specific Gravity, Urine 1.014 1.005 -  1.030   pH 5.0 5.0 - 8.0   Glucose, UA NEGATIVE NEGATIVE mg/dL   Hgb urine dipstick MODERATE (A) NEGATIVE   Bilirubin Urine NEGATIVE NEGATIVE   Ketones, ur NEGATIVE NEGATIVE mg/dL   Protein, ur NEGATIVE NEGATIVE mg/dL   Nitrite NEGATIVE NEGATIVE   Leukocytes,Ua NEGATIVE NEGATIVE   RBC / HPF 0-5  0 - 5 RBC/hpf   WBC, UA 0-5 0 - 5 WBC/hpf   Bacteria, UA NONE SEEN NONE SEEN   Squamous Epithelial / LPF 0-5 0 - 5    Comment: Performed at [facility], [place] [address], [place],  [address]    Blood Alcohol level:  Lab Results  Component Value Date   ETH <10 06/23/2020   ETH <10 78/58/8502    Metabolic Disorder Labs: Lab Results  Component Value Date   HGBA1C 5.1 06/24/2020   MPG 100 06/24/2020   No results found for: PROLACTIN Lab Results  Component Value Date   CHOL 166 06/24/2020   TRIG 87 06/24/2020   HDL 69 06/24/2020   CHOLHDL 2.4 06/24/2020   VLDL 17 06/24/2020   LDLCALC 80 06/24/2020    Physical Findings: AIMS: Facial and Oral Movements Muscles of Facial Expression: None, normal Lips and Perioral Area: None, normal Jaw: None, normal Tongue: None, normal,Extremity Movements Upper (arms, wrists, hands, fingers): None, normal Lower (legs, knees, ankles, toes): None, normal, Trunk Movements Neck, shoulders, hips: None, normal, Overall Severity Severity of abnormal movements (highest score from questions above): None, normal Incapacitation due to abnormal movements: None, normal Patient's awareness of abnormal movements (rate only patient's report): No Awareness, Dental Status Current problems with teeth and/or dentures?: No Does patient usually wear dentures?: No  CIWA:    COWS:     Musculoskeletal: Strength & Muscle Tone: within normal limits Gait & Station: normal Patient leans: N/A  Psychiatric Specialty Exam: Physical Exam Vitals and nursing note reviewed.  Constitutional:      General: She is not in acute distress.     Appearance: Normal appearance. She is normal weight. She is not ill-appearing, toxic-appearing or diaphoretic.  HENT:     Head: Normocephalic and atraumatic.  Pulmonary:     Effort: Pulmonary effort is normal.  Neurological:     Mental Status: She is alert.     Review of Systems  Constitutional: Positive for appetite change.  Respiratory: Negative for apnea, chest tightness and shortness of breath.   Gastrointestinal: Positive for constipation. Negative for nausea and vomiting.  Neurological: Positive for dizziness, light-headedness, numbness and headaches.  Psychiatric/Behavioral: Positive for dysphoric mood. Negative for hallucinations. The patient is nervous/anxious.     Blood pressure 120/84, pulse 86, temperature 98 F (36.7 C), temperature source Oral, resp. rate 16, height 5\' 4"  (1.626 m), weight 80.7 kg, last menstrual period 06/23/2020, SpO2 100 %.Body mass index is 30.55 kg/m.  General Appearance: Casual  Eye Contact:  Minimal  Speech:  Normal Rate  Volume:  Decreased  Mood:  Anxious and Depressed  Affect:  Depressed  Thought Process:  Linear and Descriptions of Associations: Intact  Orientation:  Full (Time, Place, and Person)  Thought Content:  Logical  Suicidal Thoughts:  No  Homicidal Thoughts:  No  Memory:  Immediate;   Good Recent;   Good Remote;   Good  Judgement:  Intact  Insight:  Fair  Psychomotor Activity:  Decreased  Concentration:  Concentration: Fair and Attention Span: Fair  Recall:  [employer] of Knowledge:  Good  Language:  Fair  Akathisia:  Negative  Handed:  Right  AIMS (if indicated):     Assets:  Desire for Improvement Resilience  ADL's:  Intact  Cognition:  WNL  Sleep:  Number of Hours: 6     Treatment Plan Summary:Patient is a 10-year female with a past psychiatric history significant for 3-4 times a week  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 Today, Pt is depressed , anxious, cooperative and oriented x4. Pt's speech is normal with low volume. Pt's mood is depressed and anxious with constricted affect. No SI, HI or AVH BP- 120/84 mmHg, PR- 86/min Labs- Urinalysis- No Bacteria, wbc-0-5 Prozac was stopped yesterday because of side effects.  Plan-  Daily contact with patient to assess and evaluate symptoms and progress in treatment -Monitor Vitals. -Monitor for Suicidal Ideation. -Monitor for withdrawal symptoms. -Monitor for medication side effects. -Start Remeron 15 mg QHS. -Start Colace PRN for Constipation. -Continue Fe Sulphate 325 mg Daily -Continue Folic acid 1 mg Daily -Continue Cozaar 25 mg daily -Continue Milk of Magnesia 30 ml PRN for mild constipation -Continue Neosporin for wound care as needed -Continue Zofran Q 8H for Nausea, vomiting PRN -Continue Thiamine 100 mg Daily -Continue Hydroxyzine 25 mg TID PRN for Anxiety. -Continue Trazodone 50 mg QHS PRN for sleep. - CSW to find a place for her for residential rehab after discharge.  Armando Reichert, MD 06/27/2020, 9:39 AM

## 2020-06-27 NOTE — BHH Counselor (Signed)
CSW faxed referral to [facility] for inpatient treatment. CSW called and confirmed that this patient has been added to their wait list.    Darletta Moll MSW, [facility]

## 2020-06-27 NOTE — Progress Notes (Signed)
°   06/27/20 2100  Psych Admission Type (Psych Patients Only)  Admission Status Voluntary  Psychosocial Assessment  Patient Complaints Anxiety  Eye Contact Fair  Facial Expression Worried  Affect Appropriate to circumstance  Speech Logical/coherent  Interaction Assertive  Motor Activity Other (Comment) (WNL)  Appearance/Hygiene Unremarkable  Behavior Characteristics Cooperative  Mood Depressed  Thought Process  Coherency WDL  Content WDL  Delusions None reported or observed  Perception WDL  Hallucination None reported or observed  Judgment Poor  Confusion None  Danger to Self  Current suicidal ideation? Denies  Danger to Others  Danger to Others None reported or observed

## 2020-06-28 DIAGNOSIS — F332 Major depressive disorder, recurrent severe without psychotic features: Secondary | ICD-10-CM | POA: Diagnosis not present

## 2020-06-28 MED ORDER — MIRTAZAPINE 15 MG PO TABS: 15.0000 mg | ORAL_TABLET | Freq: Every day | ORAL | 0 refills | Status: DC

## 2020-06-28 NOTE — Progress Notes (Signed)
D:  Patient denied SI and HI, contracts for safety.  Denied A/V hallucinations.  Denied pain. A:  Medications administered per MD orders.  Emotional support and encouragement given patient. R:  Safety maintained with 15 minute checks.  

## 2020-06-28 NOTE — BHH Suicide Risk Assessment (Signed)
[facility] Discharge Suicide Risk Assessment   Principal Problem: Severe recurrent major depression without psychotic features [facility]) Discharge Diagnoses: Principal Problem:   Severe recurrent major depression without psychotic features ([place])   Total Time spent with patient: 15 minutes  Musculoskeletal: Strength & Muscle Tone: within normal limits Gait & Station: normal Patient leans: N/A  Psychiatric Specialty Exam: Review of Systems  All other systems reviewed and are negative.   Blood pressure 117/85, pulse 91, temperature 98 F (36.7 C), temperature source Oral, resp. rate 16, height 5\' 4"  (1.626 m), weight 80.7 kg, last menstrual period 06/23/2020, SpO2 100 %.Body mass index is 30.55 kg/m.  General Appearance: Casual  Eye Contact::  Fair  Speech:  Normal Rate409  Volume:  Normal  Mood:  Anxious  Affect:  Congruent  Thought Process:  Coherent and Descriptions of Associations: Intact  Orientation:  Full (Time, Place, and Person)  Thought Content:  Logical  Suicidal Thoughts:  No  Homicidal Thoughts:  No  Memory:  Immediate;   Fair Recent;   Fair Remote;   Fair  Judgement:  Intact  Insight:  Fair  Psychomotor Activity:  Normal  Concentration:  Fair  Recall:  [employer] of Knowledge:Fair  Language: Fair  Akathisia:  Negative  Handed:  Right  AIMS (if indicated):     Assets:  Desire for Improvement Resilience  Sleep:  Number of Hours: 5.5  Cognition: WNL  ADL's:  Intact   Mental Status Per Nursing Assessment::   On Admission:  Suicidal ideation indicated by patient  Demographic Factors:  Divorced or widowed and Low socioeconomic status  Loss Factors: NA  Historical Factors: Impulsivity  Risk Reduction Factors:   Living with another person, especially a relative and Positive social support  Continued Clinical Symptoms:  Depression:   Impulsivity Alcohol/Substance Abuse/Dependencies  Cognitive Features That Contribute To Risk:  None    Suicide Risk:   Minimal: No identifiable suicidal ideation.  Patients presenting with no risk factors but with morbid ruminations; may be classified as minimal risk based on the severity of the depressive symptoms    Plan Of Care/Follow-up recommendations:  Activity:  ad lib  Sharma Covert, MD 06/28/2020, 7:55 AM

## 2020-06-28 NOTE — Progress Notes (Signed)
  [facility] Adult Case Management Discharge Plan :  Will you be returning to the same living situation after discharge:  [place] At discharge, do you have transportation home?: Yes,  daughters will transport to airport Do you have the ability to pay for your medications: Yes,  [employer]  Release of information consent forms completed and in the chart;  Patient's signature needed at discharge.  Patient to Follow up at:  [facility] Follow up.   Contact information: [place] [address] [phone]               Next level of care provider has access to [place] and Suicide Prevention discussed: Yes,  Sister Elzie Rings      Has patient been referred to the Quitline?: Patient refused referral  Patient has been referred for addiction treatment: Yes  Bethann Berkshire, [place] 06/28/2020, 1:53 PM

## 2020-06-28 NOTE — Progress Notes (Signed)
Discharge Note:  Patient discharged to [facility], to be driven by her two daughters.  Patient denied SI and HI, contracts for safety.  Denied A/V hallucinations.  Denied pain.  Suicide prevention information given and patient stated she understood and had no questions.  All discharge information given to patient at discharge.

## 2020-06-28 NOTE — BHH Counselor (Signed)
CSW informed by Minette Brine from [facility] admissions they can admit pt on 06/28/20 and transport pt by plane but pt would need to go door to door from [facility] to airport.   Pt is ambivalent but after discussion with CSW and family members, pt agrees to go door to door to airport.  CSW spoke with pt sister Elzie Rings who confirmed family members would bring pt packed suitcase for travel.   CSW confirmed plan with Minette Brine from [facility] [place]. Minette Brine will email flight tickets.

## 2020-06-28 NOTE — Discharge Summary (Signed)
Physician Discharge Summary Note  Patient:  Marie Atkins is an 49 y.o., female MRN:  [id number] DOB:  [birth year] Patient phone:  [phone] (home)  Patient address:   [address] [place] [id number],  Total Time spent with patient: 20 minutes  Date of Admission:  06/24/2020 Date of Discharge: 06/28/2020  Reason for Admission:  Patient is a 23-year female with a past psychiatric history significant for 3-4 times a week dependence in long-term remission until recently, depression, iron deficiency anemia as well as hypertension who presented to the [facility] emergency department on 06/23/2020 with suicidal ideation.   Principal Problem: Severe recurrent major depression without psychotic features [facility]) Discharge Diagnoses: Principal Problem:   Severe recurrent major depression without psychotic features ([place])   Past Psychiatric History: Cocaine use disorder, Opioid use disorder.  Past Medical History:  Past Medical History:  Diagnosis Date  . Cocaine abuse ([place])   . Depression   . HTN (hypertension)   . Iron deficiency anemia    History reviewed. No pertinent surgical history. Family History:  Family History  Problem Relation Age of Onset  . Hypertension Mother   . Diabetes Mother   . CVA Mother   . Heart attack Mother   . Colon cancer Maternal Uncle    Family Psychiatric  History: Major depression: Mother, sisters.                                                      Denies any family hx of suicides. Social History:  Social History   Substance and Sexual Activity  Alcohol Use Yes   Comment: "sometimes every other day," "1 to 2 cans of beer or a glass or two of wine"     Social History   Substance and Sexual Activity  Drug Use Yes  . Types: Cocaine   Comment: 3 to 4 times a week, last use on Thursday (06/22/2020)    Social History   Socioeconomic History  . Marital status: Single    Spouse name: Not on file  . Number of children: Not on file   . Years of education: Not on file  . Highest education level: Not on file  Occupational History  . Not on file  Tobacco Use  . Smoking status: Current Every Day Smoker    Types: Cigarettes  . Smokeless tobacco: Never Used  . Tobacco comment: 10 cigarettes/day  Vaping Use  . Vaping Use: Never used  Substance and Sexual Activity  . Alcohol use: Yes    Comment: "sometimes every other day," "1 to 2 cans of beer or a glass or two of wine"  . Drug use: Yes    Types: Cocaine    Comment: 3 to 4 times a week, last use on Thursday (06/22/2020)  . Sexual activity: Not Currently  Other Topics Concern  . Not on file  Social History Narrative  . Not on file   Social Determinants of Health   Financial Resource Strain:   . Difficulty of Paying Living Expenses:   Food Insecurity:   . Worried About Charity fundraiser in the Last Year:   . Arboriculturist in the Last Year:   Transportation Needs:   . Film/video editor (Medical):   Marland Kitchen Lack of Transportation (Non-Medical):   Physical Activity:   .  Days of Exercise per Week:   . Minutes of Exercise per Session:   Stress:   . Feeling of Stress :   Social Connections:   . Frequency of Communication with Friends and Family:   . Frequency of Social Gatherings with Friends and Family:   . Attends Religious Services:   . Active Member of Clubs or Organizations:   . Attends Archivist Meetings:   Marland Kitchen Marital Status:     Hospital Course:  Admission Notes (H&P) (Per Md's admission SRA notes): Patient is seen and examined. Patient is a 73-year female with a past psychiatric history significant for 3-4 times a week dependence in long-term remission until recently, depression, iron deficiency anemia as well as hypertension who presented to the [facility] emergency department on 06/23/2020 with suicidal ideation. The patient stated that she had been clean of drugs for 14 years. She stated that she had gotten into conflicts with her  significant other at that time, and then relapsed. She stated that she did not feel as though there were any other external stressors. That was approximately 3 weeks ago. She stated that she had been using crack cocaine 3-4 times a week since then. Her [facility]. She remained in that for 2 years. No other psychiatric treatment. She was last seen on 06/15/2020 and was requesting assistance for substance abuse services, but was referred to outpatient treatment. She failed to follow-up on that. She stated in the emergency department she was going to overdose on over-the-counter medications. She stated she had been treated with antidepressants in the past. The only one she could remember was Wellbutrin, and she stated that oversedated her and she did not take it long-term. She was recommended to start fluoxetine recently, but had not started it as of admission. She was quite tearful during the interview, and is requesting residential substance abuse treatment. She was admitted to the hospital for evaluation and stabilization. During Inpatient- Pt's symptoms were managed with Prozac 10 mg ,Fe Sulphate 179 mg Daily, Folic acid 1 mg Daily, Cozaar 25 mg daily, Milk of Magnesia 30 ml PRN for mild constipation Neosporin for wound care as needed, Zofran Q 8H for Nausea, vomiting PRN, Thiamine 100 mg Daily, Hydroxyzine 25 mg TID PRN for Anxiety, Trazodone 50 mg QHS PRN for sleep. Prozac was discontinued as she was feeling dizziness. Pt was started on Remeron 15 mg at bed time because of continued depression. Pt continued to feel dizziness which she thinks is most related to Trazodone. Prozac was continued again at the time of discharge. During her stay Patient did not display any dangerous, violent or suicidal behavior on the unit.  Pt interacted with patients & staff appropriately, participated appropriately in the group sessions/therapies.  Pt's medications were addressed & adjusted to meet her needs.   At the time of discharge, patient is not reporting any acute suicidal/homicidal ideations. Pt is anxious and want to go to long term rehab program. Education and supportive counseling provided throughout her hospital stay & upon discharge. Pt is accepted to [facility] addiction and she will follow up there. Pt is discharged. Physical Findings: AIMS: Facial and Oral Movements Muscles of Facial Expression: None, normal Lips and Perioral Area: None, normal Jaw: None, normal Tongue: None, normal,Extremity Movements Upper (arms, wrists, hands, fingers): None, normal Lower (legs, knees, ankles, toes): None, normal, Trunk Movements Neck, shoulders, hips: None, normal, Overall Severity Severity of abnormal  movements (highest score from questions above): None, normal Incapacitation due to abnormal movements: None, normal Patient's awareness of abnormal movements (rate only patient's report): No Awareness, Dental Status Current problems with teeth and/or dentures?: No Does patient usually wear dentures?: No  CIWA:    COWS:     Musculoskeletal: Strength & Muscle Tone: within normal limits Gait & Station: normal Patient leans: N/A  Psychiatric Specialty Exam: Physical Exam Vitals and nursing note reviewed.  Constitutional:      General: She is not in acute distress.    Appearance: Normal appearance. She is normal weight. She is not ill-appearing, toxic-appearing or diaphoretic.  HENT:     Head: Normocephalic and atraumatic.  Pulmonary:     Effort: Pulmonary effort is normal.  Neurological:     General: No focal deficit present.     Mental Status: She is alert and oriented to person, place, and time.     Review of Systems  Constitutional: Negative for activity change and appetite change.  Respiratory: Negative for chest tightness and shortness of breath.   Cardiovascular: Negative for chest pain.   Gastrointestinal: Negative for abdominal distention, abdominal pain, constipation, diarrhea, nausea and vomiting.  Neurological: Negative for dizziness, light-headedness and headaches.  Psychiatric/Behavioral: Positive for dysphoric mood and sleep disturbance. The patient is nervous/anxious.     Blood pressure 117/85, pulse 91, temperature 98 F (36.7 C), temperature source Oral, resp. rate 16, height 5\' 4"  (1.626 m), weight 80.7 kg, last menstrual period 06/23/2020, SpO2 100 %.Body mass index is 30.55 kg/m.  General Appearance: Casual  Eye Contact:  Fair  Speech:  Normal Rate  Volume:  Normal  Mood:  Anxious and Dysphoric  Affect:  Congruent  Thought Process:  Coherent and Descriptions of Associations: Intact  Orientation:  Full (Time, Place, and Person)  Thought Content:  Logical  Suicidal Thoughts:  No  Homicidal Thoughts:  No  Memory:  Immediate;   Fair Recent;   Fair Remote;   Fair  Judgement:  Fair  Insight:  Fair  Psychomotor Activity:  Normal  Concentration:  Concentration: Fair and Attention Span: Fair  Recall:  [employer] of Knowledge:  Fair  Language:  Fair  Akathisia:  Negative  Handed:  Right  AIMS (if indicated):     Assets:  Desire for Improvement Resilience  ADL's:  Intact  Cognition:  WNL  Sleep:  Number of Hours: 5.5        Has this patient used any form of tobacco in the last 30 days? (Cigarettes, Smokeless Tobacco, Cigars, and/or Pipes) Yes, Yes, A prescription for an FDA-approved tobacco cessation medication was offered at discharge and the patient refused  Blood Alcohol level:  Lab Results  Component Value Date   [facility] <10 06/23/2020   ETH <10 37/62/8315    Metabolic Disorder Labs:  Lab Results  Component Value Date   HGBA1C 5.1 06/24/2020   MPG 100 06/24/2020   No results found for: PROLACTIN Lab Results  Component Value Date   CHOL 166 06/24/2020   TRIG 87 06/24/2020   HDL 69 06/24/2020   CHOLHDL 2.4 06/24/2020   VLDL 17 06/24/2020    Inger 80 06/24/2020    See Psychiatric Specialty Exam and Suicide Risk Assessment completed by Attending Physician prior to discharge.  Discharge destination:  Other:  [facility]  Is patient on multiple antipsychotic therapies at discharge:  No   Has Patient had three or more failed trials of antipsychotic monotherapy by history:  No  Recommended Plan for Multiple Antipsychotic Therapies: NA  Discharge Instructions    Diet - low sodium heart healthy   Complete by: As directed    Increase activity slowly   Complete by: As directed    Increase activity slowly   Complete by: As directed      Allergies as of 06/28/2020      Reactions   Ace Inhibitors Other (See Comments)   Other reaction(s): Cough (ALLERGY/intolerance)   Lisinopril       Medication List    TAKE these medications     Indication  ferrous sulfate 325 (65 FE) MG tablet Take 1 tablet (325 mg total) by mouth daily.  Indication: Anemia From Inadequate Iron in the Body   FLUoxetine 10 MG capsule Commonly known as: PROZAC Take 1 capsule (10 mg total) by mouth daily.  Indication: Depression   hydrOXYzine 25 MG tablet Commonly known as: ATARAX/VISTARIL Take 1 tablet (25 mg total) by mouth 3 (three) times daily as needed for anxiety.  Indication: Feeling Anxious   losartan 50 MG tablet Commonly known as: COZAAR Take 1 tablet (50 mg total) by mouth daily.  Indication: High Blood Pressure Disorder   mirtazapine 15 MG tablet Commonly known as: REMERON Take 1 tablet (15 mg total) by mouth at bedtime.  Indication: Major Depressive Disorder   pantoprazole 40 MG tablet Commonly known as: PROTONIX Take 1 tablet (40 mg total) by mouth daily.  Indication: Gastroesophageal Reflux Disease       Follow-up Information    [facility].   Contact information: [place] [address] [phone]               Follow-up recommendations:   Activity:  As recommended by your primary care doctor. Diet:  As recommended by your primary care doctor.  Comments:  Pt is discharged to [facility] and she will follow up there for follow up. Prescriptions given at discharge.  Patient agreeable to plan. Given opportunity to ask questions.  Appears to feel comfortable with discharge denies any current suicidal or homicidal thought. Patient is also instructed prior to discharge to: Take all medications as prescribed by her mental healthcare provider. Patient has been instructed & cautioned: To not engage in alcohol and or illegal drug use while on prescription medicines. In the event of worsening symptoms, patient is instructed to call the crisis hotline, 911 and or go to the nearest ED for appropriate evaluation and treatment of symptoms. To follow-up with her primary care provider for your other medical issues, concerns and or health care needs.  Signed: Armando Reichert, MD 06/28/2020, 10:45 AM

## 2020-07-14 ENCOUNTER — Encounter: Payer: Commercial Managed Care - PPO | Admitting: Internal Medicine

## 2020-07-20 ENCOUNTER — Encounter: Payer: Commercial Managed Care - PPO | Admitting: Internal Medicine

## 2020-08-25 ENCOUNTER — Encounter: Payer: Self-pay | Admitting: Obstetrics and Gynecology

## 2020-08-25 ENCOUNTER — Encounter: Payer: Commercial Managed Care - PPO | Admitting: Obstetrics and Gynecology

## 2020-08-25 NOTE — Addendum Note (Signed)
Addended by: Yvonna Alanis E on: 08/25/2020 12:19 PM   Modules accepted: Orders

## 2020-08-25 NOTE — Progress Notes (Signed)
Patient did not keep her GYN referral appointment for 08/25/2020.  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

## 2021-03-02 ENCOUNTER — Emergency Department (HOSPITAL_COMMUNITY)
Admission: EM | Admit: 2021-03-02 | Discharge: 2021-03-02 | Disposition: A | Discharge status: Home / Self Care | Payer: Self-pay | Attending: Emergency Medicine | Admitting: Emergency Medicine

## 2021-03-02 ENCOUNTER — Encounter (HOSPITAL_COMMUNITY): Payer: Self-pay

## 2021-03-02 ENCOUNTER — Other Ambulatory Visit: Payer: Self-pay

## 2021-03-02 ENCOUNTER — Emergency Department (HOSPITAL_COMMUNITY): Payer: Self-pay

## 2021-03-02 DIAGNOSIS — R252 Cramp and spasm: Secondary | ICD-10-CM | POA: Insufficient documentation

## 2021-03-02 DIAGNOSIS — R1013 Epigastric pain: Secondary | ICD-10-CM | POA: Insufficient documentation

## 2021-03-02 DIAGNOSIS — F17210 Nicotine dependence, cigarettes, uncomplicated: Secondary | ICD-10-CM | POA: Insufficient documentation

## 2021-03-02 DIAGNOSIS — I10 Essential (primary) hypertension: Secondary | ICD-10-CM | POA: Insufficient documentation

## 2021-03-02 DIAGNOSIS — R110 Nausea: Secondary | ICD-10-CM | POA: Insufficient documentation

## 2021-03-02 DIAGNOSIS — R0602 Shortness of breath: Secondary | ICD-10-CM | POA: Insufficient documentation

## 2021-03-02 DIAGNOSIS — Z79899 Other long term (current) drug therapy: Secondary | ICD-10-CM | POA: Insufficient documentation

## 2021-03-02 DIAGNOSIS — D649 Anemia, unspecified: Secondary | ICD-10-CM | POA: Insufficient documentation

## 2021-03-02 LAB — COMPREHENSIVE METABOLIC PANEL
ALT: 19 U/L (ref 0–44)
AST: 19 U/L (ref 15–41)
Albumin: 3.6 g/dL (ref 3.5–5.0)
Alkaline Phosphatase: 96 U/L (ref 38–126)
Anion gap: 8 (ref 5–15)
BUN: 6 mg/dL (ref 6–20)
CO2: 24 mmol/L (ref 22–32)
Calcium: 9.3 mg/dL (ref 8.9–10.3)
Chloride: 102 mmol/L (ref 98–111)
Creatinine, Ser: 0.83 mg/dL (ref 0.44–1.00)
GFR, Estimated: >60 mL/min (ref >60)
Glucose, Bld: 102 mg/dL — ABNORMAL HIGH (ref 70–99)
Potassium: 4.1 mmol/L (ref 3.5–5.1)
Sodium: 134 mmol/L — ABNORMAL LOW (ref 135–145)
Total Bilirubin: 0.3 mg/dL (ref 0.3–1.2)
Total Protein: 7.3 g/dL (ref 6.5–8.1)

## 2021-03-02 LAB — URINALYSIS, ROUTINE W REFLEX MICROSCOPIC
Bacteria, UA: NONE SEEN
Bilirubin Urine: NEGATIVE
Glucose, UA: NEGATIVE mg/dL
Hgb urine dipstick: NEGATIVE
Ketones, ur: 20 mg/dL — AB
Leukocytes,Ua: NEGATIVE
Nitrite: NEGATIVE
Protein, ur: NEGATIVE mg/dL
Specific Gravity, Urine: 1.01 (ref 1.005–1.030)
pH: 9 — ABNORMAL HIGH (ref 5.0–8.0)

## 2021-03-02 LAB — I-STAT BETA HCG BLOOD, ED (MC, WL, AP ONLY): I-stat hCG, quantitative: <5 m[IU]/mL (ref <5)

## 2021-03-02 LAB — POC OCCULT BLOOD, ED: Fecal Occult Bld: NEGATIVE

## 2021-03-02 LAB — LIPASE, BLOOD: Lipase: 32 U/L (ref 11–51)

## 2021-03-02 LAB — CBC
HCT: 24.1 % — ABNORMAL LOW (ref 36.0–46.0)
Hemoglobin: 7.1 g/dL — ABNORMAL LOW (ref 12.0–15.0)
MCH: 19.6 pg — ABNORMAL LOW (ref 26.0–34.0)
MCHC: 29.5 g/dL — ABNORMAL LOW (ref 30.0–36.0)
MCV: 66.4 fL — ABNORMAL LOW (ref 80.0–100.0)
Platelets: 469 10*3/uL — ABNORMAL HIGH (ref 150–400)
RBC: 3.63 MIL/uL — ABNORMAL LOW (ref 3.87–5.11)
RDW: 22.4 % — ABNORMAL HIGH (ref 11.5–15.5)
WBC: 9.4 10*3/uL (ref 4.0–10.5)
nRBC: 0 % (ref 0.0–0.2)

## 2021-03-02 LAB — TROPONIN I (HIGH SENSITIVITY)
Troponin I (High Sensitivity): 7 ng/L (ref <18)
Troponin I (High Sensitivity): 6 ng/L (ref <18)

## 2021-03-02 MED ORDER — SUCRALFATE 1 G PO TABS: 1.0000 g | ORAL_TABLET | Freq: Three times a day (TID) | ORAL | 0 refills | Status: DC

## 2021-03-02 MED ORDER — ONDANSETRON 4 MG PO TBDP
4.0000 mg | ORAL_TABLET | Freq: Once | ORAL | Status: AC | PRN
  Administered 2021-03-02: 4 mg via ORAL
  Filled 2021-03-02: qty 1

## 2021-03-02 MED ORDER — OMEPRAZOLE 20 MG PO CPDR: 20.0000 mg | DELAYED_RELEASE_CAPSULE | Freq: Every day | ORAL | 0 refills | Status: DC

## 2021-03-02 MED ORDER — MORPHINE SULFATE (PF) 4 MG/ML IV SOLN
4.0000 mg | Freq: Once | INTRAVENOUS | Status: AC
  Administered 2021-03-02: 4 mg via INTRAVENOUS
  Filled 2021-03-02: qty 1

## 2021-03-02 MED ORDER — FAMOTIDINE IN NACL 20-0.9 MG/50ML-% IV SOLN
20.0000 mg | INTRAVENOUS | Status: AC
  Administered 2021-03-02: 20 mg via INTRAVENOUS
  Filled 2021-03-02 (×2): qty 50

## 2021-03-02 MED ORDER — SODIUM CHLORIDE 0.9 % IV BOLUS
1000.0000 mL | Freq: Once | INTRAVENOUS | Status: AC
  Administered 2021-03-02: 1000 mL via INTRAVENOUS

## 2021-03-02 MED ORDER — IOHEXOL 300 MG/ML  SOLN
100.0000 mL | Freq: Once | INTRAMUSCULAR | Status: AC | PRN
  Administered 2021-03-02: 100 mL via INTRAVENOUS

## 2021-03-02 NOTE — Discharge Instructions (Signed)
Your CT scan showed the following: Slight asymmetric right urinary tract dilatation with at most mild hydronephrosis albeit without visible obstructing urolith or lesion. Findings could reflect a recently passed stone or urinary tract infection. Correlate with urinalysis. 2. Hypoattenuating focus in the left lobe liver measuring 2 cm in size with nodular discontinuous peripheral enhancement demonstrating centripetal filling on delayed phase imaging. Appearance most suggestive of hepatic hemangioma in the absence of known malignancy or risk factors. 3. Probable fibroids within the uterus. Could consider outpatient pelvic ultrasound for further characterization. 4. Aortic Atherosclerosis (ICD10-I70.0).  I do not believe any of these findings are contributing to your pain.  I do recommend that you discuss these findings with your regular doctor.  Please contact your primary care, or contact the number listed above.  I believe your symptoms could be related to a stomach ulcer or acid reflux.  Please take the medications as prescribed.  If your symptoms change or worsen, please return to the emergency department.  I also recommend that you use an iron supplement.  You can use over-the-counter iron, and take as directed on the label.

## 2021-03-02 NOTE — ED Triage Notes (Signed)
Pt c/o abdominal, chest and back pain. States she has had nausea and shortness of breath.

## 2021-03-02 NOTE — ED Provider Notes (Signed)
[facility] EMERGENCY [facility]MENT Provider Note   CSN: [id number] Arrival date & time: 03/02/21  0101     History Chief Complaint  Patient presents with  . Abdominal Pain    Marie Atkins is a 50 y.o. female.  Patient with history of cocaine abuse and recent psychiatric admission presents to the emergency department with a chief complaint of epigastric abdominal pain that radiates into her back.  She states his symptoms started yesterday.  She reports associated nausea and shortness of breath.  She denies any fevers or chills.  She states that this is not a new problem for her.  She states that she has never been able to figure out what is causing the symptoms.  She reports associated muscle cramps.  The history is provided by the patient. No language interpreter was used.       Past Medical History:  Diagnosis Date  . Cocaine abuse ([place])   . Depression   . HTN (hypertension)   . Iron deficiency anemia     Patient Active Problem List   Diagnosis Date Noted  . Severe recurrent major depression without psychotic features ([employer]) 06/24/2020  . Menorrhagia 06/16/2020  . Iron deficiency anemia due to chronic blood loss 06/16/2020  . Proteinuria 06/16/2020  . Depression 06/16/2020  . Tobacco dependence due to cigarettes 06/16/2020  . ASCUS with positive high risk HPV cervical 06/16/2020  . Amphetamine and psychostimulant-induced psychosis with delusions ([place])     History reviewed. No pertinent surgical history.   OB History   No obstetric history on file.     Family History  Problem Relation Age of Onset  . Hypertension Mother   . Diabetes Mother   . CVA Mother   . Heart attack Mother   . Colon cancer Maternal Uncle     Social History   Tobacco Use  . Smoking status: Current Every Day Smoker    Types: Cigarettes  . Smokeless tobacco: Never Used  . Tobacco comment: 10 cigarettes/day  Vaping Use  . Vaping Use: Never used  Substance Use Topics   . Alcohol use: Yes    Comment: "sometimes every other day," "1 to 2 cans of beer or a glass or two of wine"  . Drug use: Yes    Types: Cocaine    Comment: 3 to 4 times a week, last use on Thursday (06/22/2020)    Home Medications Prior to Admission medications   Medication Sig Start Date End Date Taking? Authorizing Provider  ferrous sulfate 325 (65 FE) MG tablet Take 1 tablet (325 mg total) by mouth daily. 06/16/20 06/16/21  Virl Axe, MD  FLUoxetine (PROZAC) 10 MG capsule Take 1 capsule (10 mg total) by mouth daily. 06/26/20 06/26/21  Sharma Covert, MD  hydrOXYzine (ATARAX/VISTARIL) 25 MG tablet Take 1 tablet (25 mg total) by mouth 3 (three) times daily as needed for anxiety. 06/26/20   Sharma Covert, MD  losartan (COZAAR) 50 MG tablet Take 1 tablet (50 mg total) by mouth daily. 06/16/20   Virl Axe, MD  mirtazapine (REMERON) 15 MG tablet Take 1 tablet (15 mg total) by mouth at bedtime. 06/28/20   Armando Reichert, MD  pantoprazole (PROTONIX) 40 MG tablet Take 1 tablet (40 mg total) by mouth daily. 06/27/20   Sharma Covert, MD    Allergies    Ace inhibitors and Lisinopril  Review of Systems   Review of Systems  All other systems reviewed and are negative.   Physical  Exam Updated Vital Signs BP (!) 181/98 (BP Location: Left Arm)   Pulse 90   Temp 98 F (36.7 C) (Oral)   Resp 20   SpO2 100%   Physical Exam Vitals and nursing note reviewed.  Constitutional:      General: She is not in acute distress.    Appearance: She is well-developed.  HENT:     Head: Normocephalic and atraumatic.  Eyes:     Conjunctiva/sclera: Conjunctivae normal.  Cardiovascular:     Rate and Rhythm: Normal rate and regular rhythm.     Heart sounds: No murmur heard.   Pulmonary:     Effort: Pulmonary effort is normal. No respiratory distress.     Breath sounds: Normal breath sounds.  Abdominal:     Palpations: Abdomen is soft.     Tenderness: There is no abdominal tenderness.   Musculoskeletal:        General: Normal range of motion.     Cervical back: Neck supple.  Skin:    General: Skin is warm and dry.  Neurological:     Mental Status: She is alert and oriented to person, place, and time.  Psychiatric:        Mood and Affect: Mood normal.        Behavior: Behavior normal.     ED Results / Procedures / Treatments   Labs (all labs ordered are listed, but only abnormal results are displayed) Labs Reviewed  COMPREHENSIVE METABOLIC PANEL - Abnormal; Notable for the following components:      Result Value   Sodium 134 (*)    Glucose, Bld 102 (*)    All other components within normal limits  CBC - Abnormal; Notable for the following components:   RBC 3.63 (*)    Hemoglobin 7.1 (*)    HCT 24.1 (*)    MCV 66.4 (*)    MCH 19.6 (*)    MCHC 29.5 (*)    RDW 22.4 (*)    Platelets 469 (*)    All other components within normal limits  LIPASE, BLOOD  URINALYSIS, ROUTINE W REFLEX MICROSCOPIC  I-STAT BETA HCG BLOOD, ED ([facility], [facility], [facility] ONLY)  TROPONIN I (HIGH SENSITIVITY)  TROPONIN I (HIGH SENSITIVITY)    EKG None  Radiology DG Chest 2 View  Result Date: 03/02/2021 CLINICAL DATA:  Chest pain and shortness of breath EXAM: CHEST - 2 VIEW COMPARISON:  06/15/2020 FINDINGS: The heart size and mediastinal contours are within normal limits. Both lungs are clear. The visualized skeletal structures are unremarkable. IMPRESSION: No active cardiopulmonary disease. Electronically Signed   By: Inez Catalina M.D.   On: 03/02/2021 02:07    Procedures Procedures   Medications Ordered in ED Medications  morphine 4 MG/ML injection 4 mg (has no administration in time range)  ondansetron (ZOFRAN-ODT) disintegrating tablet 4 mg (4 mg Oral Given 03/02/21 0120)    ED Course  I have reviewed the triage vital signs and the nursing notes.  Pertinent labs & imaging results that were available during my care of the patient were reviewed by me and considered in my medical  decision making (see chart for details).    MDM Rules/Calculators/A&P                          Patient was sound asleep when I came to evaluate her in the room, when I woke her, she started writhing in pain.  She is noted to be mildly hypotensive.  She is not vomiting in the room.  Her lung sounds are clear.  Laboratory work-up is thus far reassuring.  Will check CT scan.  Pregnancy test negative.  Troponins negative x2.  Lipase 32.  CT scan shows no focal findings would describe the patient's pain today.  I did discuss the findings of the CT with the patient.  I have encouraged her to follow-up with her primary care doctor.  She requests a new primary care doctor, I have given her follow-up instructions with [facility].  Patient's hemoglobin is noted to be 7.1, this is lower than she has been in the past.  She denies having any black, tarry, or bloody stools.  Rectal exam negative for blood, Hemoccult card negative.  Question if her symptoms are related to peptic ulcer.  She did seem to have some improvement with Pepcid.  We will trial prescription of Carafate and omeprazole.  Patient encouraged to get iron supplements over-the-counter.  She is agreeable with this plan.  She appears stable for discharge.  Final Clinical Impression(s) / ED Diagnoses Final diagnoses:  Epigastric pain  Anemia, unspecified type    Rx / DC Orders ED Discharge Orders    None       Montine Circle, PA-C 11/64/35 [id number]    Delora Fuel, MD 25/83/46 [phone]

## 2021-04-15 ENCOUNTER — Emergency Department (HOSPITAL_COMMUNITY)
Admission: EM | Admit: 2021-04-15 | Discharge: 2021-04-16 | Disposition: A | Discharge status: Home / Self Care | Payer: Self-pay | Attending: Emergency Medicine | Admitting: Emergency Medicine

## 2021-04-15 ENCOUNTER — Emergency Department (HOSPITAL_COMMUNITY): Payer: Self-pay

## 2021-04-15 ENCOUNTER — Encounter (HOSPITAL_COMMUNITY): Payer: Self-pay

## 2021-04-15 ENCOUNTER — Emergency Department (HOSPITAL_COMMUNITY)
Admission: EM | Admit: 2021-04-15 | Discharge: 2021-04-15 | Discharge status: Against Medical Advice | Payer: Self-pay | Attending: Emergency Medicine | Admitting: Emergency Medicine

## 2021-04-15 ENCOUNTER — Encounter (HOSPITAL_COMMUNITY): Payer: Self-pay | Admitting: Emergency Medicine

## 2021-04-15 DIAGNOSIS — K5900 Constipation, unspecified: Secondary | ICD-10-CM

## 2021-04-15 DIAGNOSIS — R072 Precordial pain: Secondary | ICD-10-CM | POA: Insufficient documentation

## 2021-04-15 DIAGNOSIS — R1013 Epigastric pain: Secondary | ICD-10-CM | POA: Insufficient documentation

## 2021-04-15 DIAGNOSIS — Z79899 Other long term (current) drug therapy: Secondary | ICD-10-CM | POA: Insufficient documentation

## 2021-04-15 DIAGNOSIS — Z5321 Procedure and treatment not carried out due to patient leaving prior to being seen by health care provider: Secondary | ICD-10-CM | POA: Insufficient documentation

## 2021-04-15 DIAGNOSIS — D509 Iron deficiency anemia, unspecified: Secondary | ICD-10-CM

## 2021-04-15 DIAGNOSIS — I10 Essential (primary) hypertension: Secondary | ICD-10-CM | POA: Insufficient documentation

## 2021-04-15 DIAGNOSIS — F17210 Nicotine dependence, cigarettes, uncomplicated: Secondary | ICD-10-CM | POA: Insufficient documentation

## 2021-04-15 LAB — CBC WITH DIFFERENTIAL/PLATELET
Abs Immature Granulocytes: 0.07 10*3/uL (ref 0.00–0.07)
Basophils Absolute: 0.1 10*3/uL (ref 0.0–0.1)
Basophils Relative: 1 %
Eosinophils Absolute: 0 10*3/uL (ref 0.0–0.5)
Eosinophils Relative: 0 %
HCT: 29.5 % — ABNORMAL LOW (ref 36.0–46.0)
Hemoglobin: 8.3 g/dL — ABNORMAL LOW (ref 12.0–15.0)
Immature Granulocytes: 1 %
Lymphocytes Relative: 21 %
Lymphs Abs: 2.4 10*3/uL (ref 0.7–4.0)
MCH: 19.7 pg — ABNORMAL LOW (ref 26.0–34.0)
MCHC: 28.1 g/dL — ABNORMAL LOW (ref 30.0–36.0)
MCV: 70.1 fL — ABNORMAL LOW (ref 80.0–100.0)
Monocytes Absolute: 0.9 10*3/uL (ref 0.1–1.0)
Monocytes Relative: 8 %
Neutro Abs: 8.1 10*3/uL — ABNORMAL HIGH (ref 1.7–7.7)
Neutrophils Relative %: 69 %
Platelets: 535 10*3/uL — ABNORMAL HIGH (ref 150–400)
RBC: 4.21 MIL/uL (ref 3.87–5.11)
RDW: 26 % — ABNORMAL HIGH (ref 11.5–15.5)
WBC: 11.6 10*3/uL — ABNORMAL HIGH (ref 4.0–10.5)
nRBC: 0 % (ref 0.0–0.2)

## 2021-04-15 LAB — URINALYSIS, ROUTINE W REFLEX MICROSCOPIC
Bilirubin Urine: NEGATIVE
Glucose, UA: NEGATIVE mg/dL
Hgb urine dipstick: NEGATIVE
Ketones, ur: NEGATIVE mg/dL
Leukocytes,Ua: NEGATIVE
Nitrite: NEGATIVE
Protein, ur: NEGATIVE mg/dL
Specific Gravity, Urine: 1.018 (ref 1.005–1.030)
pH: 6 (ref 5.0–8.0)

## 2021-04-15 LAB — COMPREHENSIVE METABOLIC PANEL
ALT: 22 U/L (ref 0–44)
AST: 21 U/L (ref 15–41)
Albumin: 3.5 g/dL (ref 3.5–5.0)
Alkaline Phosphatase: 88 U/L (ref 38–126)
Anion gap: 8 (ref 5–15)
BUN: 10 mg/dL (ref 6–20)
CO2: 22 mmol/L (ref 22–32)
Calcium: 9.2 mg/dL (ref 8.9–10.3)
Chloride: 108 mmol/L (ref 98–111)
Creatinine, Ser: 0.76 mg/dL (ref 0.44–1.00)
GFR, Estimated: >60 mL/min (ref >60)
Glucose, Bld: 108 mg/dL — ABNORMAL HIGH (ref 70–99)
Potassium: 3.7 mmol/L (ref 3.5–5.1)
Sodium: 138 mmol/L (ref 135–145)
Total Bilirubin: 0.3 mg/dL (ref 0.3–1.2)
Total Protein: 7.6 g/dL (ref 6.5–8.1)

## 2021-04-15 LAB — I-STAT BETA HCG BLOOD, ED (MC, WL, AP ONLY): I-stat hCG, quantitative: <5 m[IU]/mL (ref <5)

## 2021-04-15 LAB — RAPID URINE DRUG SCREEN, HOSP PERFORMED
Amphetamines: NOT DETECTED
Barbiturates: NOT DETECTED
Benzodiazepines: NOT DETECTED
Cocaine: POSITIVE — AB
Opiates: NOT DETECTED
Tetrahydrocannabinol: NOT DETECTED

## 2021-04-15 LAB — LIPASE, BLOOD: Lipase: 35 U/L (ref 11–51)

## 2021-04-15 LAB — TROPONIN I (HIGH SENSITIVITY): Troponin I (High Sensitivity): 6 ng/L (ref <18)

## 2021-04-15 MED ORDER — ALUM & MAG HYDROXIDE-SIMETH 200-200-20 MG/5ML PO SUSP
30.0000 mL | Freq: Once | ORAL | Status: AC
  Administered 2021-04-16: 30 mL via ORAL
  Filled 2021-04-15: qty 30

## 2021-04-15 MED ORDER — POLYETHYLENE GLYCOL 3350 17 G PO PACK: 17.0000 g | PACK | Freq: Every day | ORAL | 0 refills | Status: DC

## 2021-04-15 MED ORDER — OMEPRAZOLE 20 MG PO CPDR: 20.0000 mg | DELAYED_RELEASE_CAPSULE | Freq: Every day | ORAL | 0 refills | Status: DC

## 2021-04-15 MED ORDER — FERROUS SULFATE 325 (65 FE) MG PO TABS: 325.0000 mg | ORAL_TABLET | Freq: Three times a day (TID) | ORAL | 0 refills | Status: DC

## 2021-04-15 NOTE — ED Provider Notes (Signed)
Emergency Medicine Provider Triage Evaluation Note  Marie Atkins , a 50 y.o. female  was evaluated in triage.  Pt complains of worsening upper abdominal pain.  She has been seen for the same April, she states she was told she needs an EGD. She states that she is here today because the pain is worse than usual. She states that she took her blood pressure medicines both today and yesterday.  She reports tingling/paresthesias of her body. She denies any fevers, chest pain or shortness of breath.  She states that her pain worsens when she eats..  Review of Systems  Positive: Abdominal pain, parasthesias, nausea, vomiting/dairrhea.  Negative: Chest pain  Physical Exam  BP (!) 182/95   Pulse 77   Temp 98.6 F (37 C) (Oral)   Resp (!) 22   SpO2 100%  Gen:   Awake, tearful Resp:  Normal effort  MSK:   Moves extremities without difficulty  Other:  Pain in upper abdomen.   Medical Decision Making  Medically screening exam initiated at 8:24 PM.  Appropriate orders placed.  Elgie Raetz was informed that the remainder of the evaluation will be completed by another provider, this initial triage assessment does not replace that evaluation, and the importance of remaining in the ED until their evaluation is complete.     Ollen Gross 04/15/21 2026    Valarie Merino, MD 04/15/21 2050

## 2021-04-15 NOTE — ED Triage Notes (Signed)
Pt tearful in triage, report substernal chest pain that has continued toget worse. Pt is non complaint w/ medication inclduing her hypertension medication. Reports pain gets worse when she eats.

## 2021-04-15 NOTE — ED Provider Notes (Addendum)
[facility] DEPT Provider Note   CSN: [id number] Arrival date & time: 04/15/21  2229     History Chief Complaint  Patient presents with  . Abdominal Pain    Marie Atkins is a 50 y.o. female.  The history is provided by the patient.  Abdominal Pain Pain location:  Epigastric Pain quality: aching   Pain radiates to:  Does not radiate Pain severity:  Severe Onset quality:  Gradual Duration: months constanet to years  Timing:  Constant Chronicity:  New Context: not eating, not previous surgeries, not sick contacts and not suspicious food intake   Relieved by:  Nothing Worsened by:  Nothing Ineffective treatments:  None tried Associated symptoms: no anorexia, no belching, no chest pain, no diarrhea, no dysuria, no fever, no shortness of breath and no vomiting   Risk factors: no NSAID use   Patient with anemia has had ongoing issues and was supposed to get endoscopy and did not follow up.  No f/c/r.      Past Medical History:  Diagnosis Date  . Cocaine abuse ([place])   . Depression   . HTN (hypertension)   . Iron deficiency anemia     Patient Active Problem List   Diagnosis Date Noted  . Severe recurrent major depression without psychotic features ([place]) 06/24/2020  . Menorrhagia 06/16/2020  . Iron deficiency anemia due to chronic blood loss 06/16/2020  . Proteinuria 06/16/2020  . Depression 06/16/2020  . Tobacco dependence due to cigarettes 06/16/2020  . ASCUS with positive high risk HPV cervical 06/16/2020  . Amphetamine and psychostimulant-induced psychosis with delusions ([place])     History reviewed. No pertinent surgical history.   OB History   No obstetric history on file.     Family History  Problem Relation Age of Onset  . Hypertension Mother   . Diabetes Mother   . CVA Mother   . Heart attack Mother   . Colon cancer Maternal Uncle     Social History   Tobacco Use  . Smoking status: Current Every Day Smoker    Types:  Cigarettes  . Smokeless tobacco: Never Used  . Tobacco comment: 10 cigarettes/day  Vaping Use  . Vaping Use: Never used  Substance Use Topics  . Alcohol use: Yes    Comment: "sometimes every other day," "1 to 2 cans of beer or a glass or two of wine"  . Drug use: Yes    Types: Cocaine    Comment: 3 to 4 times a week, last use on Thursday (06/22/2020)    Home Medications Prior to Admission medications   Medication Sig Start Date End Date Taking? Authorizing Provider  ferrous sulfate 325 (65 FE) MG tablet Take 1 tablet (325 mg total) by mouth daily. 06/16/20 06/16/21  Virl Axe, MD  FLUoxetine (PROZAC) 10 MG capsule Take 1 capsule (10 mg total) by mouth daily. 06/26/20 06/26/21  Sharma Covert, MD  hydrOXYzine (ATARAX/VISTARIL) 25 MG tablet Take 1 tablet (25 mg total) by mouth 3 (three) times daily as needed for anxiety. 06/26/20   Sharma Covert, MD  losartan (COZAAR) 50 MG tablet Take 1 tablet (50 mg total) by mouth daily. 06/16/20   Virl Axe, MD  mirtazapine (REMERON) 15 MG tablet Take 1 tablet (15 mg total) by mouth at bedtime. 06/28/20   Armando Reichert, MD  omeprazole (PRILOSEC) 20 MG capsule Take 1 capsule (20 mg total) by mouth daily. 03/02/21   Montine Circle, PA-C  sucralfate (CARAFATE) 1 g tablet  Take 1 tablet (1 g total) by mouth 4 (four) times daily -  with meals and at bedtime. 03/02/21   Montine Circle, PA-C    Allergies    Ace inhibitors and Lisinopril  Review of Systems   Review of Systems  Constitutional: Negative for fever.  HENT: Negative for facial swelling.   Eyes: Negative for redness.  Respiratory: Negative for shortness of breath.   Cardiovascular: Negative for chest pain.  Gastrointestinal: Positive for abdominal pain. Negative for anorexia, diarrhea and vomiting.  Genitourinary: Negative for dysuria.  Musculoskeletal: Negative for neck stiffness.  Skin: Negative for rash.  Neurological: Negative for facial asymmetry.  Psychiatric/Behavioral:  Negative for agitation.  All other systems reviewed and are negative.   Physical Exam Updated Vital Signs BP (!) 187/89 (BP Location: Left Arm)   Pulse (!) 55   Temp 98.6 F (37 C) (Oral)   Resp 18   SpO2 99%   Physical Exam Vitals and nursing note reviewed.  Constitutional:      General: She is not in acute distress.    Appearance: Normal appearance.     Comments: Sleeping in the room upon entrance and then immediately goes back to sleep.  No non verbal cues of pain   HENT:     Head: Normocephalic and atraumatic.     Nose: Nose normal.  Eyes:     Conjunctiva/sclera: Conjunctivae normal.     Pupils: Pupils are equal, round, and reactive to light.  Cardiovascular:     Rate and Rhythm: Normal rate and regular rhythm.     Pulses: Normal pulses.     Heart sounds: Normal heart sounds.  Pulmonary:     Effort: Pulmonary effort is normal.     Breath sounds: Normal breath sounds.  Abdominal:     General: Abdomen is flat. Bowel sounds are normal.     Palpations: Abdomen is soft.     Tenderness: There is no abdominal tenderness. There is no guarding.  Musculoskeletal:        General: Normal range of motion.     Cervical back: Normal range of motion and neck supple.  Skin:    General: Skin is warm and dry.     Capillary Refill: Capillary refill takes less than 2 seconds.  Neurological:     General: No focal deficit present.     Mental Status: She is alert and oriented to person, place, and time.     Cranial Nerves: No cranial nerve deficit.     Sensory: No sensory deficit.     Motor: No weakness.     Deep Tendon Reflexes: Reflexes normal.     Comments: Intact sensation and motor to BUE   Psychiatric:        Mood and Affect: Mood normal.        Behavior: Behavior normal.     ED Results / Procedures / Treatments   Labs (all labs ordered are listed, but only abnormal results are displayed) Results for orders placed or performed during the hospital encounter of 04/15/21   Comprehensive metabolic panel  Result Value Ref Range   Sodium 138 135 - 145 mmol/L   Potassium 3.7 3.5 - 5.1 mmol/L   Chloride 108 98 - 111 mmol/L   CO2 22 22 - 32 mmol/L   Glucose, Bld 108 (H) 70 - 99 mg/dL   BUN 10 6 - 20 mg/dL   Creatinine, Ser 0.76 0.44 - 1.00 mg/dL   Calcium 9.2 8.9 - 10.3 mg/dL  Total Protein 7.6 6.5 - 8.1 g/dL   Albumin 3.5 3.5 - 5.0 g/dL   AST 21 15 - 41 U/L   ALT 22 0 - 44 U/L   Alkaline Phosphatase 88 38 - 126 U/L   Total Bilirubin 0.3 0.3 - 1.2 mg/dL   GFR, Estimated >60 >60 mL/min   Anion gap 8 5 - 15  Lipase, blood  Result Value Ref Range   Lipase 35 11 - 51 U/L  CBC with Differential  Result Value Ref Range   WBC 11.6 (H) 4.0 - 10.5 K/uL   RBC 4.21 3.87 - 5.11 MIL/uL   Hemoglobin 8.3 (L) 12.0 - 15.0 g/dL   HCT 29.5 (L) 36.0 - 46.0 %   MCV 70.1 (L) 80.0 - 100.0 fL   MCH 19.7 (L) 26.0 - 34.0 pg   MCHC 28.1 (L) 30.0 - 36.0 g/dL   RDW 26.0 (H) 11.5 - 15.5 %   Platelets 535 (H) 150 - 400 K/uL   nRBC 0.0 0.0 - 0.2 %   Neutrophils Relative % 69 %   Neutro Abs 8.1 (H) 1.7 - 7.7 K/uL   Lymphocytes Relative 21 %   Lymphs Abs 2.4 0.7 - 4.0 K/uL   Monocytes Relative 8 %   Monocytes Absolute 0.9 0.1 - 1.0 K/uL   Eosinophils Relative 0 %   Eosinophils Absolute 0.0 0.0 - 0.5 K/uL   Basophils Relative 1 %   Basophils Absolute 0.1 0.0 - 0.1 K/uL   Immature Granulocytes 1 %   Abs Immature Granulocytes 0.07 0.00 - 0.07 K/uL   Target Cells PRESENT    Ovalocytes PRESENT   Urinalysis, Routine w reflex microscopic  Result Value Ref Range   Color, Urine YELLOW YELLOW   APPearance CLEAR CLEAR   Specific Gravity, Urine 1.018 1.005 - 1.030   pH 6.0 5.0 - 8.0   Glucose, UA NEGATIVE NEGATIVE mg/dL   Hgb urine dipstick NEGATIVE NEGATIVE   Bilirubin Urine NEGATIVE NEGATIVE   Ketones, ur NEGATIVE NEGATIVE mg/dL   Protein, ur NEGATIVE NEGATIVE mg/dL   Nitrite NEGATIVE NEGATIVE   Leukocytes,Ua NEGATIVE NEGATIVE  Urine rapid drug screen (hosp performed)   Result Value Ref Range   Opiates NONE DETECTED NONE DETECTED   Cocaine POSITIVE (A) NONE DETECTED   Benzodiazepines NONE DETECTED NONE DETECTED   Amphetamines NONE DETECTED NONE DETECTED   Tetrahydrocannabinol NONE DETECTED NONE DETECTED   Barbiturates NONE DETECTED NONE DETECTED  I-Stat beta hCG blood, ED  Result Value Ref Range   I-stat hCG, quantitative <5.0 <5 mIU/mL   Comment 3          Troponin I (High Sensitivity)  Result Value Ref Range   Troponin I (High Sensitivity) 6 <18 ng/L   DG Chest 2 View  Result Date: 04/15/2021 CLINICAL DATA:  Substernal chest pain. EXAM: CHEST - 2 VIEW COMPARISON:  Tiegan Terpstra 22, 2022 FINDINGS: There is no evidence of an acute infiltrate, pleural effusion or pneumothorax. The heart size and mediastinal contours are within normal limits. The visualized skeletal structures are unremarkable. IMPRESSION: No active cardiopulmonary disease. Electronically Signed   By: Virgina Norfolk M.D.   On: 04/15/2021 21:03    Radiology DG Chest 2 View  Result Date: 04/15/2021 CLINICAL DATA:  Substernal chest pain. EXAM: CHEST - 2 VIEW COMPARISON:  Linzy Laury 22, 2022 FINDINGS: There is no evidence of an acute infiltrate, pleural effusion or pneumothorax. The heart size and mediastinal contours are within normal limits. The visualized skeletal structures are unremarkable. IMPRESSION:  No active cardiopulmonary disease. Electronically Signed   By: Virgina Norfolk M.D.   On: 04/15/2021 21:03    Procedures Procedures   Medications Ordered in ED Medications  alum & mag hydroxide-simeth (MAALOX/MYLANTA) 200-200-20 MG/5ML suspension 30 mL (has no administration in time range)    ED Course  I have reviewed the triage vital signs and the nursing notes.  Pertinent labs & imaging results that were available during my care of the patient were reviewed by me and considered in my medical decision making (see chart for details).    Hemoglobin is better than previous but patient is  not taking her iron.  She will need to follow up with GI.  Patient stated to triage she had ongoing RUE tingling for weeks but then points to LUE.  Regardless,  BUE strength and sensation are intact. Given ongoing symptoms for weeks, one troponin and negative EKG is sufficient to exclude ACS.  Symptoms are also with eating which is atypical and consistent with GERD.  HEART score is 1 very low risk for MACE.  PERC negative wells 0 highly doubt PE in this low risk patient. I have advised close follow up for ongoing symptoms with PMD and GI.  Patient verbalizes understanding and agrees to follow up.   Marie Atkins was evaluated in Emergency Department on 04/15/2021 for the symptoms described in the history of present illness. She was evaluated in the context of the global COVID-19 pandemic, which necessitated consideration that the patient might be at risk for infection with the SARS-CoV-2 virus that causes COVID-19. Institutional protocols and algorithms that pertain to the evaluation of patients at risk for COVID-19 are in a state of rapid change based on information released by regulatory bodies including the CDC and federal and state organizations. These policies and algorithms were followed during the patient's care in the ED.  Final Clinical Impression(s) / ED Diagnoses Return for intractable cough, coughing up blood, fevers >100.4 unrelieved by medication, shortness of breath, intractable vomiting, chest pain, shortness of breath, weakness, numbness, changes in speech, facial asymmetry, abdominal pain, passing out, Inability to tolerate liquids or food, cough, altered mental status or any concerns. No signs of systemic illness or infection. The patient is nontoxic-appearing on exam and vital signs are within normal limits.  I have reviewed the triage vital signs and the nursing notes. Pertinent labs & imaging results that were available during my care of the patient were reviewed by me and considered in my  medical decision making (see chart for details). After history, exam, and medical workup I feel the patient has been appropriately medically screened and is safe for discharge home. Pertinent diagnoses were discussed with the patient. Patient was given return precautions.   Miamor Ayler, MD 04/15/21 [id number]    Veatrice Kells, MD 04/16/21 [id number]

## 2021-04-15 NOTE — ED Notes (Signed)
Pt stated she was leaving because of wait time

## 2021-04-15 NOTE — ED Triage Notes (Signed)
Patient arrived from [facility] with complaints of upper abdominal pain and left arm tingling over the last few weeks. States she is supposed to get and EGD for a possible ulcer but has not yet. Blood work, EKG, and urine completed prior to arrival at this location.

## 2021-08-19 IMAGING — DX DG CHEST 1V PORT
1 series · 1 of 1 positions shown · non-contrast
Comparison: 02/26/2006

CLINICAL DATA: Medical clearance.  Hallucinations after cocaine use

EXAM:
PORTABLE CHEST 1 VIEW

[chest ap]
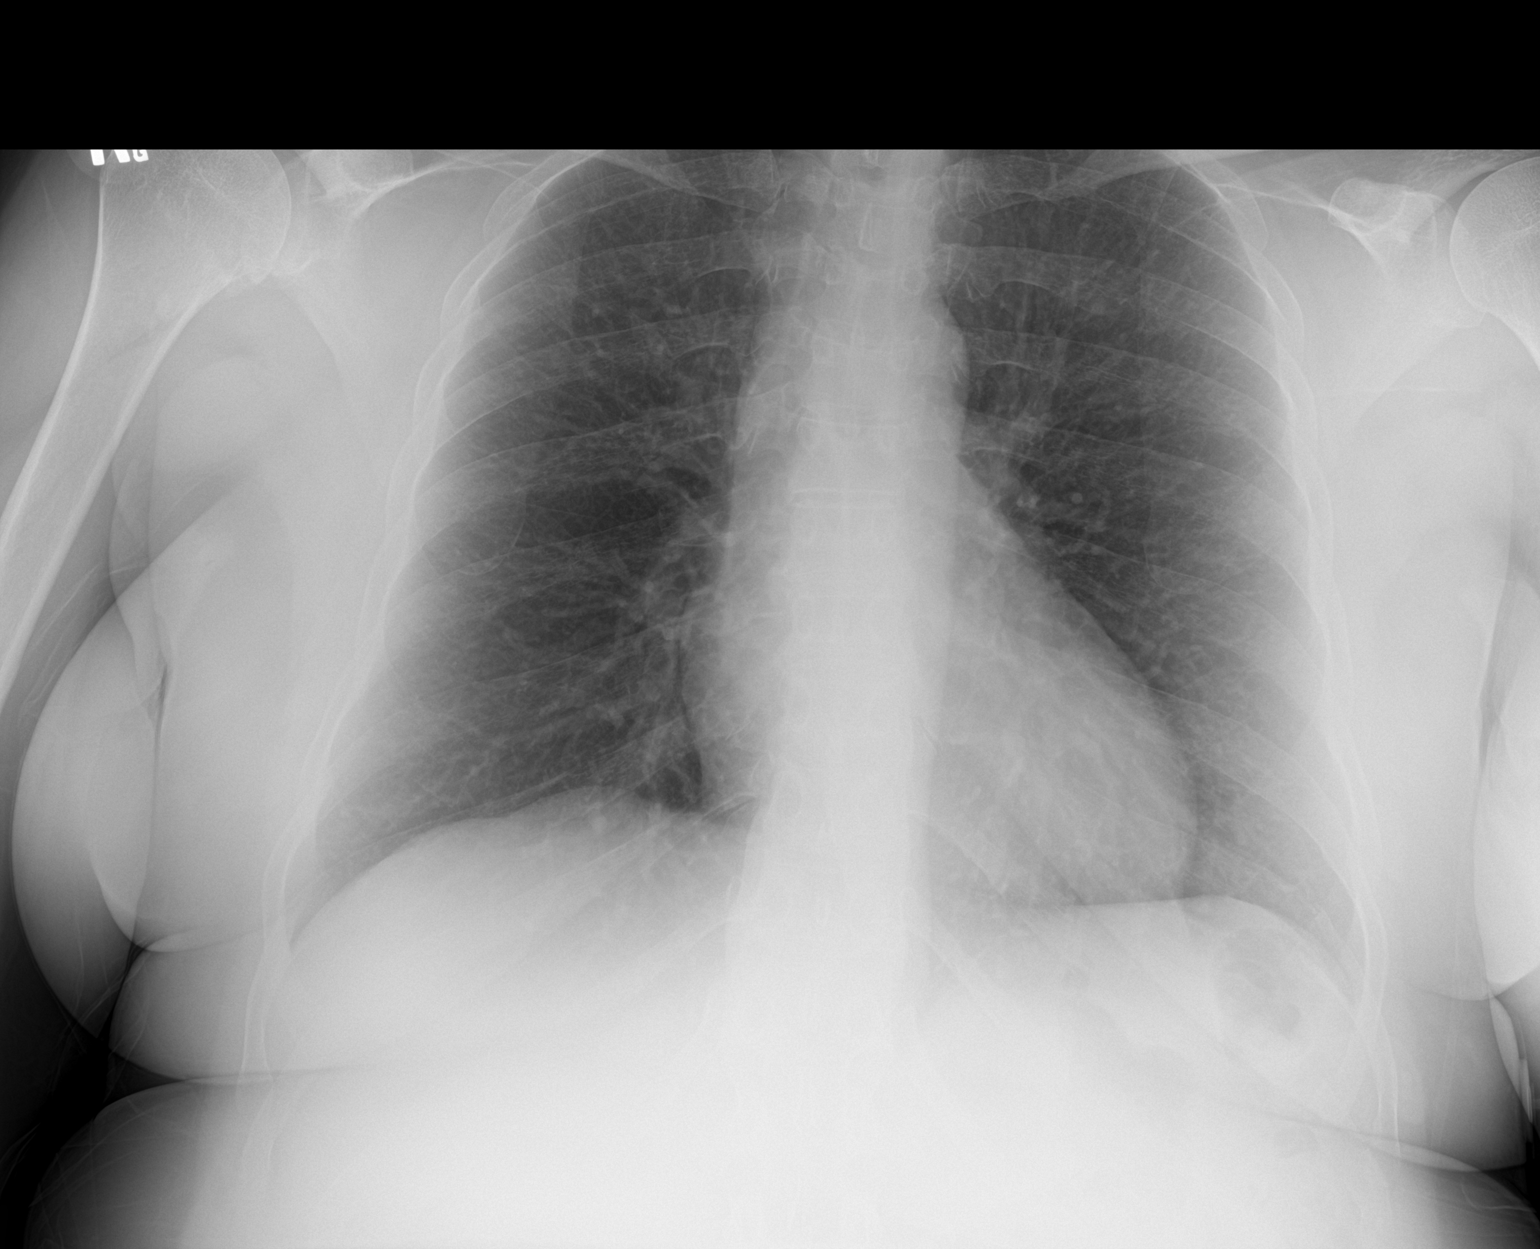

[1 of 1 positions shown; findings below may reference images not displayed]

FINDINGS: Normal heart size and mediastinal contours. No acute infiltrate or
edema. No effusion or pneumothorax. No acute osseous findings.
IMPRESSION: Negative chest.

## 2022-03-21 ENCOUNTER — Ambulatory Visit: Payer: Self-pay | Admitting: Family Medicine

## 2022-03-25 ENCOUNTER — Other Ambulatory Visit: Payer: Self-pay

## 2022-03-25 ENCOUNTER — Encounter (HOSPITAL_COMMUNITY): Payer: Self-pay

## 2022-03-25 ENCOUNTER — Emergency Department (HOSPITAL_COMMUNITY)
Admission: EM | Admit: 2022-03-25 | Discharge: 2022-03-26 | Disposition: A | Discharge status: Home / Self Care | Payer: 59 | Attending: Emergency Medicine | Admitting: Emergency Medicine

## 2022-03-25 ENCOUNTER — Emergency Department (HOSPITAL_COMMUNITY): Payer: 59

## 2022-03-25 DIAGNOSIS — R0789 Other chest pain: Secondary | ICD-10-CM | POA: Insufficient documentation

## 2022-03-25 DIAGNOSIS — R110 Nausea: Secondary | ICD-10-CM | POA: Insufficient documentation

## 2022-03-25 DIAGNOSIS — M546 Pain in thoracic spine: Secondary | ICD-10-CM | POA: Diagnosis not present

## 2022-03-25 DIAGNOSIS — R770 Abnormality of albumin: Secondary | ICD-10-CM | POA: Diagnosis not present

## 2022-03-25 DIAGNOSIS — R059 Cough, unspecified: Secondary | ICD-10-CM | POA: Insufficient documentation

## 2022-03-25 DIAGNOSIS — Z79899 Other long term (current) drug therapy: Secondary | ICD-10-CM | POA: Insufficient documentation

## 2022-03-25 DIAGNOSIS — I10 Essential (primary) hypertension: Secondary | ICD-10-CM | POA: Diagnosis not present

## 2022-03-25 DIAGNOSIS — M549 Dorsalgia, unspecified: Secondary | ICD-10-CM | POA: Diagnosis present

## 2022-03-25 DIAGNOSIS — D649 Anemia, unspecified: Secondary | ICD-10-CM | POA: Diagnosis not present

## 2022-03-25 LAB — COMPREHENSIVE METABOLIC PANEL
ALT: 15 U/L (ref 0–44)
AST: 15 U/L (ref 15–41)
Albumin: 3.4 g/dL — ABNORMAL LOW (ref 3.5–5.0)
Alkaline Phosphatase: 84 U/L (ref 38–126)
Anion gap: 5 (ref 5–15)
BUN: 10 mg/dL (ref 6–20)
CO2: 27 mmol/L (ref 22–32)
Calcium: 9.4 mg/dL (ref 8.9–10.3)
Chloride: 106 mmol/L (ref 98–111)
Creatinine, Ser: 0.85 mg/dL (ref 0.44–1.00)
GFR, Estimated: >60 mL/min (ref >60)
Glucose, Bld: 99 mg/dL (ref 70–99)
Potassium: 4.1 mmol/L (ref 3.5–5.1)
Sodium: 138 mmol/L (ref 135–145)
Total Bilirubin: 0.6 mg/dL (ref 0.3–1.2)
Total Protein: 7.4 g/dL (ref 6.5–8.1)

## 2022-03-25 LAB — CBC WITH DIFFERENTIAL/PLATELET
Abs Immature Granulocytes: 0.04 10*3/uL (ref 0.00–0.07)
Basophils Absolute: 0.1 10*3/uL (ref 0.0–0.1)
Basophils Relative: 1 %
Eosinophils Absolute: 0.1 10*3/uL (ref 0.0–0.5)
Eosinophils Relative: 1 %
HCT: 35.1 % — ABNORMAL LOW (ref 36.0–46.0)
Hemoglobin: 10.7 g/dL — ABNORMAL LOW (ref 12.0–15.0)
Immature Granulocytes: 0 %
Lymphocytes Relative: 28 %
Lymphs Abs: 2.8 10*3/uL (ref 0.7–4.0)
MCH: 23.9 pg — ABNORMAL LOW (ref 26.0–34.0)
MCHC: 30.5 g/dL (ref 30.0–36.0)
MCV: 78.5 fL — ABNORMAL LOW (ref 80.0–100.0)
Monocytes Absolute: 1 10*3/uL (ref 0.1–1.0)
Monocytes Relative: 10 %
Neutro Abs: 5.9 10*3/uL (ref 1.7–7.7)
Neutrophils Relative %: 60 %
Platelets: 410 10*3/uL — ABNORMAL HIGH (ref 150–400)
RBC: 4.47 MIL/uL (ref 3.87–5.11)
RDW: 20.9 % — ABNORMAL HIGH (ref 11.5–15.5)
WBC: 9.9 10*3/uL (ref 4.0–10.5)
nRBC: 0 % (ref 0.0–0.2)

## 2022-03-25 LAB — TROPONIN I (HIGH SENSITIVITY): Troponin I (High Sensitivity): 7 ng/L (ref <18)

## 2022-03-25 NOTE — ED Triage Notes (Signed)
Pt arrived POV from home c/o hypertension that started yesterday and back pain. Pt states she thinks she pulled a muscle.  ?

## 2022-03-25 NOTE — ED Provider Triage Note (Signed)
Emergency Medicine Provider Triage Evaluation Note ? ?Marie Atkins , a 51 y.o. female  was evaluated in triage.  Pt complains of left sided back pain radiating to left shoulder and left chest.  Symptoms began 1 week ago but have gradually worsened.  Pain is worse with movement.  Checked her blood pressure at [facility] this morning and it was high.  Admits that she is supposed to be on antihypertensive medication but has not taken it for a while.  Denies any shortness of breath. ? ?Review of Systems  ?Positive: Chest pain, back pain ?Negative: Shortness of breath ? ?Physical Exam  ?BP (!) 177/102 (BP Location: Left Arm)   Pulse 68   Temp 98.5 ?F (36.9 ?C) (Oral)   Resp 16   Ht '5\' 4"'$  (1.626 m)   Wt 77.1 kg   SpO2 100%   BMI 29.18 kg/m?  ?Gen:   Awake, no distress   ?Resp:  Normal effort  ?MSK:   Moves extremities without difficulty  ?Other:  Lungs clear bilaterally ? ?Medical Decision Making  ?Medically screening exam initiated at 6:53 PM.  Appropriate orders placed.  Kirstin Dice was informed that the remainder of the evaluation will be completed by another provider, this initial triage assessment does not replace that evaluation, and the importance of remaining in the ED until their evaluation is complete. ? ?Work-up initiated ?  ?Delia Heady, PA-C ?03/25/22 1854 ? ?

## 2022-03-26 ENCOUNTER — Encounter (HOSPITAL_COMMUNITY): Payer: Self-pay | Admitting: Student

## 2022-03-26 LAB — URINALYSIS, ROUTINE W REFLEX MICROSCOPIC
Bilirubin Urine: NEGATIVE
Glucose, UA: NEGATIVE mg/dL
Ketones, ur: NEGATIVE mg/dL
Leukocytes,Ua: NEGATIVE
Nitrite: NEGATIVE
Protein, ur: NEGATIVE mg/dL
Specific Gravity, Urine: 1.017 (ref 1.005–1.030)
pH: 6 (ref 5.0–8.0)

## 2022-03-26 LAB — LIPASE, BLOOD: Lipase: 33 U/L (ref 11–51)

## 2022-03-26 LAB — TROPONIN I (HIGH SENSITIVITY): Troponin I (High Sensitivity): 8 ng/L (ref <18)

## 2022-03-26 MED ORDER — METHOCARBAMOL 500 MG PO TABS
500.0000 mg | ORAL_TABLET | Freq: Once | ORAL | Status: AC
  Administered 2022-03-26: 500 mg via ORAL
  Filled 2022-03-26: qty 1

## 2022-03-26 MED ORDER — METHOCARBAMOL 500 MG PO TABS: 500.0000 mg | ORAL_TABLET | Freq: Three times a day (TID) | ORAL | 0 refills | Status: DC | PRN

## 2022-03-26 MED ORDER — LIDOCAINE 5 % EX PTCH: 1.0000 | MEDICATED_PATCH | Freq: Every day | CUTANEOUS | 0 refills | Status: DC | PRN

## 2022-03-26 MED ORDER — LOSARTAN POTASSIUM 50 MG PO TABS
50.0000 mg | ORAL_TABLET | Freq: Once | ORAL | Status: AC
  Administered 2022-03-26: 50 mg via ORAL
  Filled 2022-03-26: qty 1

## 2022-03-26 MED ORDER — LOSARTAN POTASSIUM 50 MG PO TABS: 50.0000 mg | ORAL_TABLET | Freq: Every day | ORAL | 0 refills | Status: DC

## 2022-03-26 MED ORDER — NAPROXEN 500 MG PO TABS: 500.0000 mg | ORAL_TABLET | Freq: Two times a day (BID) | ORAL | 0 refills | Status: DC | PRN

## 2022-03-26 MED ORDER — ACETAMINOPHEN 325 MG PO TABS
650.0000 mg | ORAL_TABLET | Freq: Once | ORAL | Status: AC
  Administered 2022-03-26: 650 mg via ORAL
  Filled 2022-03-26: qty 2

## 2022-03-26 MED ORDER — LIDOCAINE 5 % EX PTCH
2.0000 | MEDICATED_PATCH | CUTANEOUS | Status: DC
  Administered 2022-03-26: 2 via TRANSDERMAL
  Filled 2022-03-26: qty 2

## 2022-03-26 NOTE — ED Notes (Signed)
Patient verbalizes understanding of d/c instructions. Opportunities for questions and answers were provided. Pt d/c from ED and ambulated to lobby where family is picking pt up.  

## 2022-03-26 NOTE — Discharge Instructions (Addendum)
You were seen in the emergency department today for back and chest pain.  Your work-up was overall reassuring.  Your labs showed some anemia, please have this rechecked by your primary care provider.  You are sending home with the following medicines to help with your symptoms ? ?- Naproxen- this is a nonsteroidal anti-inflammatory medication that will help with pain and swelling. Be sure to take this medication as prescribed with food, 1 pill every 12 hours,  It should be taken with food, as it can cause stomach upset, and more seriously, stomach bleeding. Do not take other nonsteroidal anti-inflammatory medications with this such as Advil, Motrin, Aleve, Mobic, Goodie Powder, or Motrin etc..   ? ?- Robaxin- this is the muscle relaxer I have prescribed, this is meant to help with muscle tightness/spasms. Be aware that this medication may make you drowsy therefore the first time you take this it should be at a time you are in an environment where you can rest. Do not drive or operate heavy machinery when taking this medication. Do not drink alcohol or take other sedating medications with this medicine such as narcotics or benzodiazepines.  ? ?- Lidoderm patch- Apply 1 patch to your area of most significant pain once per day to help numb/soothe this area. Remove & discard patch within 12 hours of application.  Do not place heat over the Lidoderm patches ? ?You make take Tylenol per over the counter dosing with these medications.  ? ?We have prescribed you new medication(s) today. Discuss the medications prescribed today with your pharmacist as they can have adverse effects and interactions with your other medicines including over the counter and prescribed medications. Seek medical evaluation if you start to experience new or abnormal symptoms after taking one of these medicines, seek care immediately if you start to experience difficulty breathing, feeling of your throat closing, facial swelling, or rash as these  could be indications of a more serious allergic reaction ? ? ? ?We also sent you home with a prescription for your blood pressure medication, please take this as prescribed as your blood pressure was elevated in the ER today. ? ?Please follow with your primary care within 3 days.  Return to the emergency department for new or worsening symptoms or any other concerns. ?

## 2022-03-26 NOTE — ED Provider Notes (Signed)
?[facility] ?Provider Note ? ? ?CSN: [id number] ?Arrival date & time: 03/25/22  1739 ? ?  ?History ? ?Chief Complaint  ?Patient presents with  ? Back Pain  ? Hypertension  ? ? ?Marie Atkins is a 51 y.o. female with a history of hypertension, anemia, and substance use who presents to the emergency department with complaints of back pain for the past week.  Patient states the pain is located to the left side of her back and into her lower chest, states it feels like a bruise muscle.  Pain is worse with movement, position changes, and coughing.  She states she has been coughing for the past month.  Cough is sometimes productive.  Has had some nausea at times.  She denies fever, vomiting, leg pain/swelling, hemoptysis, recent surgery/trauma, recent long travel, hormone use, personal hx of cancer, or hx of DVT/PE.  She does admit to crack use, last utilized 3 days prior, none today.  Also reports problems with hypertension, has not been taking her blood pressure medication for several months. ? ? ? ?HPI ? ?  ? ?Home Medications ?Prior to Admission medications   ?Medication Sig Start Date End Date Taking? Authorizing Provider  ?ferrous sulfate 325 (65 FE) MG tablet Take 1 tablet (325 mg total) by mouth 3 (three) times daily with meals. 04/15/21   Palumbo, April, MD  ?FLUoxetine (PROZAC) 10 MG capsule Take 1 capsule (10 mg total) by mouth daily. 06/26/20 06/26/21  Sharma Covert, MD  ?hydrOXYzine (ATARAX/VISTARIL) 25 MG tablet Take 1 tablet (25 mg total) by mouth 3 (three) times daily as needed for anxiety. 06/26/20   Sharma Covert, MD  ?losartan (COZAAR) 50 MG tablet Take 1 tablet (50 mg total) by mouth daily. 06/16/20   Virl Axe, MD  ?mirtazapine (REMERON) 15 MG tablet Take 1 tablet (15 mg total) by mouth at bedtime. 06/28/20   Armando Reichert, MD  ?omeprazole (PRILOSEC) 20 MG capsule Take 1 capsule (20 mg total) by mouth daily. 03/02/21   Montine Circle, PA-C  ?omeprazole  (PRILOSEC) 20 MG capsule Take 1 capsule (20 mg total) by mouth daily. 04/15/21   Palumbo, April, MD  ?polyethylene glycol (MIRALAX) 17 g packet Take 17 g by mouth daily. 04/15/21   Palumbo, April, MD  ?sucralfate (CARAFATE) 1 g tablet Take 1 tablet (1 g total) by mouth 4 (four) times daily -  with meals and at bedtime. 03/02/21   Montine Circle, PA-C  ?   ? ?Allergies    ?Ace inhibitors and Lisinopril   ? ?Review of Systems   ?Review of Systems  ?Constitutional:  Negative for chills and fever.  ?Respiratory:  Negative for shortness of breath.   ?Cardiovascular:  Positive for chest pain.  ?Gastrointestinal:  Positive for nausea. Negative for abdominal pain and vomiting.  ?Genitourinary:  Positive for frequency. Negative for dysuria.  ?Musculoskeletal:  Positive for back pain.  ?Neurological:  Negative for syncope, weakness and numbness.  ?     Negative for incontinence or saddle anesthesia.  ?All other systems reviewed and are negative. ? ?Physical Exam ?Updated Vital Signs ?BP (!) 190/92 (BP Location: Left Arm)   Pulse 81   Temp 98.5 ?F (36.9 ?C) (Oral)   Resp 18   Ht '5\' 4"'$  (1.626 m)   Wt 77.1 kg   SpO2 100%   BMI 29.18 kg/m?  ?Physical Exam ?Vitals and nursing note reviewed.  ?Constitutional:   ?   General: She is not in acute  distress. ?   Appearance: She is well-developed. She is not toxic-appearing.  ?HENT:  ?   Head: Normocephalic and atraumatic.  ?Eyes:  ?   General:     ?   Right eye: No discharge.     ?   Left eye: No discharge.  ?   Conjunctiva/sclera: Conjunctivae normal.  ?Cardiovascular:  ?   Rate and Rhythm: Normal rate and regular rhythm.  ?   Pulses:     ?     Radial pulses are 2+ on the right side and 2+ on the left side.  ?     Posterior tibial pulses are 2+ on the right side and 2+ on the left side.  ?Pulmonary:  ?   Effort: No respiratory distress.  ?   Breath sounds: Normal breath sounds. No wheezing or rales.  ?Chest:  ?   Chest wall: Tenderness (left lower anterior, lateral, and posteiror  chest wall) present.  ?Abdominal:  ?   General: There is no distension.  ?   Palpations: Abdomen is soft.  ?   Tenderness: There is no abdominal tenderness.  ?Musculoskeletal:  ?   Cervical back: Normal range of motion and neck supple. No spinous process tenderness or muscular tenderness.  ?   Comments: No obvious deformity, appreciable swelling, erythema, ecchymosis, significant open wounds, or increased warmth.  ?Back: No point/focal vertebral tenderness, no palpable step off or crepitus. Left lateral thoracic paraspinal muscles.  ?Lower extremities: ranging @ all major joints. No focal bony tenderness. No edema. Marland Kitchen   ?Skin: ?   General: Skin is warm and dry.  ?   Findings: No rash.  ?Neurological:  ?   Mental Status: She is alert.  ?   Comments: Sensation grossly intact to bilateral lower extremities. 5/5 symmetric strength with plantar/dorsiflexion bilaterally. Gait is intact without obvious foot drop.   ?Psychiatric:     ?   Behavior: Behavior normal.  ? ? ?ED Results / Procedures / Treatments   ?Labs ?(all labs ordered are listed, but only abnormal results are displayed) ?Labs Reviewed  ?COMPREHENSIVE METABOLIC PANEL - Abnormal; Notable for the following components:  ?    Result Value  ? Albumin 3.4 (*)   ? All other components within normal limits  ?CBC WITH DIFFERENTIAL/PLATELET - Abnormal; Notable for the following components:  ? Hemoglobin 10.7 (*)   ? HCT 35.1 (*)   ? MCV 78.5 (*)   ? MCH 23.9 (*)   ? RDW 20.9 (*)   ? Platelets 410 (*)   ? All other components within normal limits  ?URINALYSIS, ROUTINE W REFLEX MICROSCOPIC - Abnormal; Notable for the following components:  ? APPearance HAZY (*)   ? Hgb urine dipstick LARGE (*)   ? Bacteria, UA FEW (*)   ? All other components within normal limits  ?LIPASE, BLOOD  ?TROPONIN I (HIGH SENSITIVITY)  ?TROPONIN I (HIGH SENSITIVITY)  ? ? ?EKG ?None ? ?Radiology ?DG Chest 2 View ? ?Result Date: 03/25/2022 ?CLINICAL DATA:  Chest pain, hypertension, back pain EXAM:  CHEST - 2 VIEW COMPARISON:  04/15/2021 FINDINGS: Frontal and lateral views of the chest demonstrate a stable cardiac silhouette. No acute airspace disease, effusion, or pneumothorax. No acute bony abnormality. IMPRESSION: 1. Stable chest, no acute process. Electronically Signed   By: Randa Ngo M.D.   On: 03/25/2022 19:16   ? ?Procedures ?Procedures  ? ? ?Medications Ordered in ED ?Medications - No data to display ? ?ED Course/ Medical Decision Making/  A&P ?  ?                        ?Medical Decision Making ?Amount and/or Complexity of Data Reviewed ?Labs: ordered. ? ?Risk ?OTC drugs. ?Prescription drug management. ? ? ?Patient presents to the ED with complaints of left sided back pain, this involves an extensive number of treatment options, and is a complaint that carries with it a high risk of complications and morbidity. Nontoxic, vitals with elevated BP.  ? ?Additional history obtained:  ?Chart & nursing note reviewed.  ? ?EKG: no STEMI ? ?Lab Tests:  ?I viewed & interpreted labs including:  ?CBC: Anemia ?CMP: Mild hypoalbuminemia ?Lipase: Within normal limits ?Troponins: Flat, no significant elevation ?Urinalysis: Hemoglobin present, however no significant WBCs, no UTI, contaminated with squamous epithelial cells. ? ?Imaging Studies:  ?I viewed the following imaging, agree with radiologist impression:  ?Chest x-ray: 1. Stable chest, no acute process ? ?ED Course:  ?I ordered medications including tylenol, robaxin, and lidoderm patch for pain and her home dose of losartan for her hypertension.  ? ?Patient has been sleeping resting comfortably in the emergency department, on reassessment she is feeling improved, feel she is reasonable for discharge. ? ?Low risk heart pathway score, troponins are flat, low suspicion for ACS.  Low risk [place], low suspicion for PE.  Consider dissection given her hypertension and description of pain, however this has been ongoing for 1 week, pain is reproducible with  musculoskeletal palpation, she has symmetric pulses and she has no widened mediastinum on chest x-ray, feel that this is unlikely.  Chest x-ray without pneumothorax or pneumonia.  Labs without abnormal LFT/lipase.  Abdomen wi

## 2022-05-11 ENCOUNTER — Ambulatory Visit (HOSPITAL_COMMUNITY)
Admission: EM | Admit: 2022-05-11 | Discharge: 2022-05-11 | Disposition: A | Discharge status: Home / Self Care | Payer: 59 | Attending: Physician Assistant | Admitting: Physician Assistant

## 2022-05-11 ENCOUNTER — Encounter (HOSPITAL_COMMUNITY): Payer: Self-pay | Admitting: Emergency Medicine

## 2022-05-11 DIAGNOSIS — I10 Essential (primary) hypertension: Secondary | ICD-10-CM | POA: Diagnosis present

## 2022-05-11 DIAGNOSIS — R112 Nausea with vomiting, unspecified: Secondary | ICD-10-CM | POA: Insufficient documentation

## 2022-05-11 DIAGNOSIS — K2900 Acute gastritis without bleeding: Secondary | ICD-10-CM | POA: Diagnosis present

## 2022-05-11 LAB — COMPREHENSIVE METABOLIC PANEL
ALT: 13 U/L (ref 0–44)
AST: 18 U/L (ref 15–41)
Albumin: 3.8 g/dL (ref 3.5–5.0)
Alkaline Phosphatase: 90 U/L (ref 38–126)
Anion gap: 12 (ref 5–15)
BUN: 8 mg/dL (ref 6–20)
CO2: 26 mmol/L (ref 22–32)
Calcium: 9.2 mg/dL (ref 8.9–10.3)
Chloride: 96 mmol/L — ABNORMAL LOW (ref 98–111)
Creatinine, Ser: 0.88 mg/dL (ref 0.44–1.00)
GFR, Estimated: >60 mL/min (ref >60)
Glucose, Bld: 103 mg/dL — ABNORMAL HIGH (ref 70–99)
Potassium: 3.4 mmol/L — ABNORMAL LOW (ref 3.5–5.1)
Sodium: 134 mmol/L — ABNORMAL LOW (ref 135–145)
Total Bilirubin: 0.3 mg/dL (ref 0.3–1.2)
Total Protein: 7.8 g/dL (ref 6.5–8.1)

## 2022-05-11 LAB — CBC WITH DIFFERENTIAL/PLATELET
Abs Immature Granulocytes: 0.06 10*3/uL (ref 0.00–0.07)
Basophils Absolute: 0.1 10*3/uL (ref 0.0–0.1)
Basophils Relative: 1 %
Eosinophils Absolute: 0.1 10*3/uL (ref 0.0–0.5)
Eosinophils Relative: 1 %
HCT: 32.7 % — ABNORMAL LOW (ref 36.0–46.0)
Hemoglobin: 10.7 g/dL — ABNORMAL LOW (ref 12.0–15.0)
Immature Granulocytes: 1 %
Lymphocytes Relative: 15 %
Lymphs Abs: 1.9 10*3/uL (ref 0.7–4.0)
MCH: 25.2 pg — ABNORMAL LOW (ref 26.0–34.0)
MCHC: 32.7 g/dL (ref 30.0–36.0)
MCV: 76.9 fL — ABNORMAL LOW (ref 80.0–100.0)
Monocytes Absolute: 0.9 10*3/uL (ref 0.1–1.0)
Monocytes Relative: 7 %
Neutro Abs: 9.2 10*3/uL — ABNORMAL HIGH (ref 1.7–7.7)
Neutrophils Relative %: 75 %
Platelets: 448 10*3/uL — ABNORMAL HIGH (ref 150–400)
RBC: 4.25 MIL/uL (ref 3.87–5.11)
RDW: 19 % — ABNORMAL HIGH (ref 11.5–15.5)
WBC: 12.2 10*3/uL — ABNORMAL HIGH (ref 4.0–10.5)
nRBC: 0 % (ref 0.0–0.2)

## 2022-05-11 LAB — LIPASE, BLOOD: Lipase: 31 U/L (ref 11–51)

## 2022-05-11 MED ORDER — ALUM & MAG HYDROXIDE-SIMETH 200-200-20 MG/5ML PO SUSP
ORAL | Status: AC
  Filled 2022-05-11: qty 30

## 2022-05-11 MED ORDER — LIDOCAINE VISCOUS HCL 2 % MT SOLN
15.0000 mL | Freq: Once | OROMUCOSAL | Status: AC
  Administered 2022-05-11: 15 mL via ORAL

## 2022-05-11 MED ORDER — ONDANSETRON 4 MG PO TBDP
4.0000 mg | ORAL_TABLET | Freq: Once | ORAL | Status: AC
  Administered 2022-05-11: 4 mg via ORAL

## 2022-05-11 MED ORDER — SUCRALFATE 1 GM/10ML PO SUSP: 1.0000 g | Freq: Three times a day (TID) | ORAL | 0 refills | Status: DC

## 2022-05-11 MED ORDER — ONDANSETRON 4 MG PO TBDP
ORAL_TABLET | ORAL | Status: AC
  Filled 2022-05-11: qty 1

## 2022-05-11 MED ORDER — LOSARTAN POTASSIUM 50 MG PO TABS: 50.0000 mg | ORAL_TABLET | Freq: Every day | ORAL | 2 refills | Status: DC

## 2022-05-11 MED ORDER — ALUM & MAG HYDROXIDE-SIMETH 200-200-20 MG/5ML PO SUSP
30.0000 mL | Freq: Once | ORAL | Status: AC
  Administered 2022-05-11: 30 mL via ORAL

## 2022-05-11 MED ORDER — LIDOCAINE VISCOUS HCL 2 % MT SOLN
OROMUCOSAL | Status: AC
  Filled 2022-05-11: qty 15

## 2022-05-11 NOTE — ED Triage Notes (Signed)
Patient has a history GERD, having abdominal pain and vomiting for several days.  Patient has taken Mylanta.

## 2022-05-11 NOTE — Discharge Instructions (Signed)
I will contact you with your lab work tomorrow.  Please take medication as prescribed.  If you have any worsening symptoms you need to go to the emergency room as we discussed.

## 2022-05-11 NOTE — ED Notes (Signed)
Patient vomited in treatment room.  Junie Panning, [place] aware

## 2022-05-11 NOTE — ED Provider Notes (Signed)
[facility]    CSN: [id number] Arrival date & time: 05/11/22  1646      History   Chief Complaint Chief Complaint  Patient presents with   Gastroesophageal Reflux    HPI Marie Atkins is a 51 y.o. female.   Patient presents today companied by a friend with a several day history of worsening abdominal pain.  She reports this has been going on for several months and she has been seen in the emergency room in the past.  She has been told that she needs an endoscopy but has not yet had this done.  She reports that pain is rated 10 on a 0-10 pain scale, localized to left upper abdomen with radiation throughout epigastrium, described as sharp, no aggravating relieving factors identified.  She has tried omeprazole as well as Nexium without improvement of symptoms.  She has also been taking baking soda and Mylanta.  She denies any recent antibiotic use.  She does report having some melena approximately a month ago but reports that her more recent bowel movements have been normal.  She has not established with a GI specialist.  She does report regular alcohol use but has difficulty quantifying this.  She also reports occasional crack cocaine use with last use earlier today.  She does not take NSAIDs on a regular basis.  Patient's blood pressure is elevated.  She is out of her losartan is requesting refill of this medication.  Denies current chest pain, shortness of breath, headache, vision change.    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 No pertinent surgical history.  OB History   No obstetric history on file.      Home Medications    Prior to Admission medications   Medication Sig Start Date End Date Taking? Authorizing Provider  lidocaine (LIDODERM) 5 % Place 1 patch onto the skin daily as needed. Apply patch to area most significant pain once per day.  Remove and discard patch within 12 hours of application. 03/26/22  Yes Petrucelli, Samantha R, PA-C  methocarbamol (ROBAXIN) 500 MG tablet Take 1 tablet (500 mg total) by mouth every 8 (eight) hours as needed for muscle spasms. 03/26/22  Yes Petrucelli, Samantha R, PA-C  sucralfate (CARAFATE) 1 GM/10ML suspension Take 10 mLs (1 g total) by mouth 4 (four) times daily -  with meals and at bedtime. 05/11/22  Yes Markelle Najarian K, PA-C  losartan (COZAAR) 50 MG tablet Take 1 tablet (50 mg total) by mouth daily. 05/11/22   Aveah Castell, Derry Skill, PA-C    Family History Family History  Problem Relation Age of Onset   Hypertension Mother    Diabetes Mother    CVA Mother    Heart attack Mother    Colon cancer Maternal Uncle     Social History Social History   Tobacco Use   Smoking status: Every Day    Types: Cigarettes   Smokeless  tobacco: Never   Tobacco comments:    10 cigarettes/day  Vaping Use   Vaping Use: Never used  Substance Use Topics   Alcohol use: Yes    Comment: "sometimes every other day," "1 to 2 cans of beer or a glass or two of wine"   Drug use: Yes    Types: Cocaine    Comment: 3 to 4 times a week, last use on Thursday (06/22/2020)     Allergies   Ace inhibitors and Lisinopril   Review of Systems Review of Systems  Constitutional:  Positive for activity change and fatigue. Negative for appetite change and fever.  Respiratory:  Negative for cough and shortness of breath.   Cardiovascular:  Negative for chest pain.  Gastrointestinal:   Positive for abdominal pain, nausea and vomiting. Negative for blood in stool, constipation and diarrhea.  Neurological:  Negative for dizziness, light-headedness and headaches.     Physical Exam Triage Vital Signs ED Triage Vitals  Enc Vitals Group     BP 05/11/22 1733 (!) 178/93     Pulse Rate 05/11/22 1733 91     Resp 05/11/22 1733 18     Temp 05/11/22 1733 98.2 F (36.8 C)     Temp Source 05/11/22 1733 Oral     SpO2 05/11/22 1733 99 %     Weight 05/11/22 1734 165 lb (74.8 kg)     Height 05/11/22 1734 '5\' 4"'$  (1.626 m)     Head Circumference --      Peak Flow --      Pain Score 05/11/22 1734 9     Pain Loc --      Pain Edu? --      Excl. in [place]? --    No data found.  Updated Vital Signs BP (!) 162/94 (BP Location: Left Arm)   Pulse 78   Temp 98.3 F (36.8 C) (Oral)   Resp 20   Ht '5\' 4"'$  (1.626 m)   Wt 165 lb (74.8 kg)   SpO2 100%   BMI 28.32 kg/m   Visual Acuity Right Eye Distance:   Left Eye Distance:   Bilateral Distance:    Right Eye Near:   Left Eye Near:    Bilateral Near:     Physical Exam Vitals reviewed.  Constitutional:      General: She is awake. She is not in acute distress.    Appearance: Normal appearance. She is well-developed. She is ill-appearing.     Comments: Very pleasant female laying on exam table in obvious discomfort with a trash can at bedside and tearful  HENT:     Head: Normocephalic and atraumatic.  Cardiovascular:     Rate and Rhythm: Normal rate and regular rhythm.     Heart sounds: Normal heart sounds, S1 normal and S2 normal. No murmur heard. Pulmonary:     Effort: Pulmonary effort is normal.     Breath sounds: Normal breath sounds. No wheezing, rhonchi or rales.     Comments: Clear to auscultation bilaterally Abdominal:     General: Bowel sounds are normal.     Palpations: Abdomen is soft.     Tenderness: There is abdominal tenderness in the epigastric area and left upper quadrant. There is guarding. There is no right  CVA tenderness, left CVA tenderness or rebound.  Psychiatric:        Behavior: Behavior is cooperative.      UC Treatments / Results  Labs (all labs ordered are listed, but only  abnormal results are displayed) Labs Reviewed  CBC WITH DIFFERENTIAL/PLATELET  COMPREHENSIVE METABOLIC PANEL  LIPASE, BLOOD    EKG   Radiology No results found.  Procedures Procedures (including critical care time)  Medications Ordered in UC Medications  ondansetron (ZOFRAN-ODT) disintegrating tablet 4 mg (4 mg Oral Given 05/11/22 1817)  alum & mag hydroxide-simeth (MAALOX/MYLANTA) 200-200-20 MG/5ML suspension 30 mL (30 mLs Oral Given 05/11/22 1837)    And  lidocaine (XYLOCAINE) 2 % viscous mouth solution 15 mL (15 mLs Oral Given 05/11/22 1837)    Initial Impression / Assessment and Plan / UC Course  I have reviewed the triage vital signs and the nursing notes.  Pertinent labs & imaging results that were available during my care of the patient were reviewed by me and considered in my medical decision making (see chart for details).     Discussed at length that the safest thing to do would be to go to the emergency room for further evaluation and management, however, patient declined this on multiple occasions.  Discussed that she would need a CT scan given severity of pain which we do not have capabilities in urgent care.  She expressed understanding but still refused to go to the ER.  She was given Zofran and GI cocktail with significant improvement of symptoms in clinic today.  Sent her home with Carafate.  Recommend that she avoid spicy/acidic/fatty foods.  She is to limit NSAIDs and alcohol.  CBC, CMP, lipase pending.  We will contact her with these labs tomorrow if they change her treatment plan.  Discussed that she should follow-up with a GI specialist.  Discussed that if she has any recurrent or worsening symptoms she is to go directly to the emergency room to which she expressed  understanding.  Blood pressure is elevated today.  Patient denies any headache, dizziness, chest pain, shortness of breath.  Refill of losartan was sent to pharmacy.  CMP was checked today and we will contact her if we need to change her medication based on this lab result.  Recommended that she avoid NSAIDs, caffeine, sodium, decongestants.  Recommend that she monitor this at home and keep a log for evaluation of follow-up appointment with PCP.  Patient expressed that she does have unhealthy alcohol and drug use habits.  She was provided a list of resources to help address mental health as well as drug use.  Final Clinical Impressions(s) / UC Diagnoses   Final diagnoses:  Acute gastritis, presence of bleeding unspecified, unspecified gastritis type  Nausea and vomiting, unspecified vomiting type  Elevated blood pressure reading with diagnosis of hypertension     Discharge Instructions      I will contact you with your lab work tomorrow.  Please take medication as prescribed.  If you have any worsening symptoms you need to go to the emergency room as we discussed.     ED Prescriptions     Medication Sig Dispense Auth. Provider   losartan (COZAAR) 50 MG tablet Take 1 tablet (50 mg total) by mouth daily. 30 tablet Petrina Melby K, PA-C   sucralfate (CARAFATE) 1 GM/10ML suspension Take 10 mLs (1 g total) by mouth 4 (four) times daily -  with meals and at bedtime. 420 mL Vanderbilt Ranieri K, PA-C      PDMP not reviewed this encounter.   Terrilee Croak, PA-C 05/11/22 1901

## 2022-05-12 ENCOUNTER — Telehealth (HOSPITAL_COMMUNITY): Payer: Self-pay | Admitting: Physician Assistant

## 2022-05-12 MED ORDER — SUCRALFATE 1 G PO TABS: 1.0000 g | ORAL_TABLET | Freq: Three times a day (TID) | ORAL | 0 refills | Status: DC

## 2022-05-12 NOTE — Telephone Encounter (Signed)
Patient called indicating that Carafate syrup is not available.  Carafate tabs were sent to pharmacy with instruction to dissolve in 30 mL of water to create a slurry.  Attempted to call patient to discuss this change in medication and left a message for call back as she did not answer the phone.  If she has any additional difficulty getting his medication she should contact us.

## 2022-05-12 NOTE — Telephone Encounter (Signed)
Called and discussed medication change with patient who expressed understanding.  She will make a slurry with Carafate tabs..  Reports that pain has recurred and she was encouraged to use over-the-counter acid reducing medications as well such as Pepcid and omeprazole.  Reviewed lab work with patient and encouraged her to follow-up with GI.  She also requested help with establishing with a primary care provider so referral to family medicine was placed in epic.

## 2022-06-19 IMAGING — CR DG CHEST 2V
2 series · 2 of 2 positions shown · non-contrast
Comparison: March 02, 2021

CLINICAL DATA: Substernal chest pain.

EXAM:
CHEST - 2 VIEW

[chest pa]
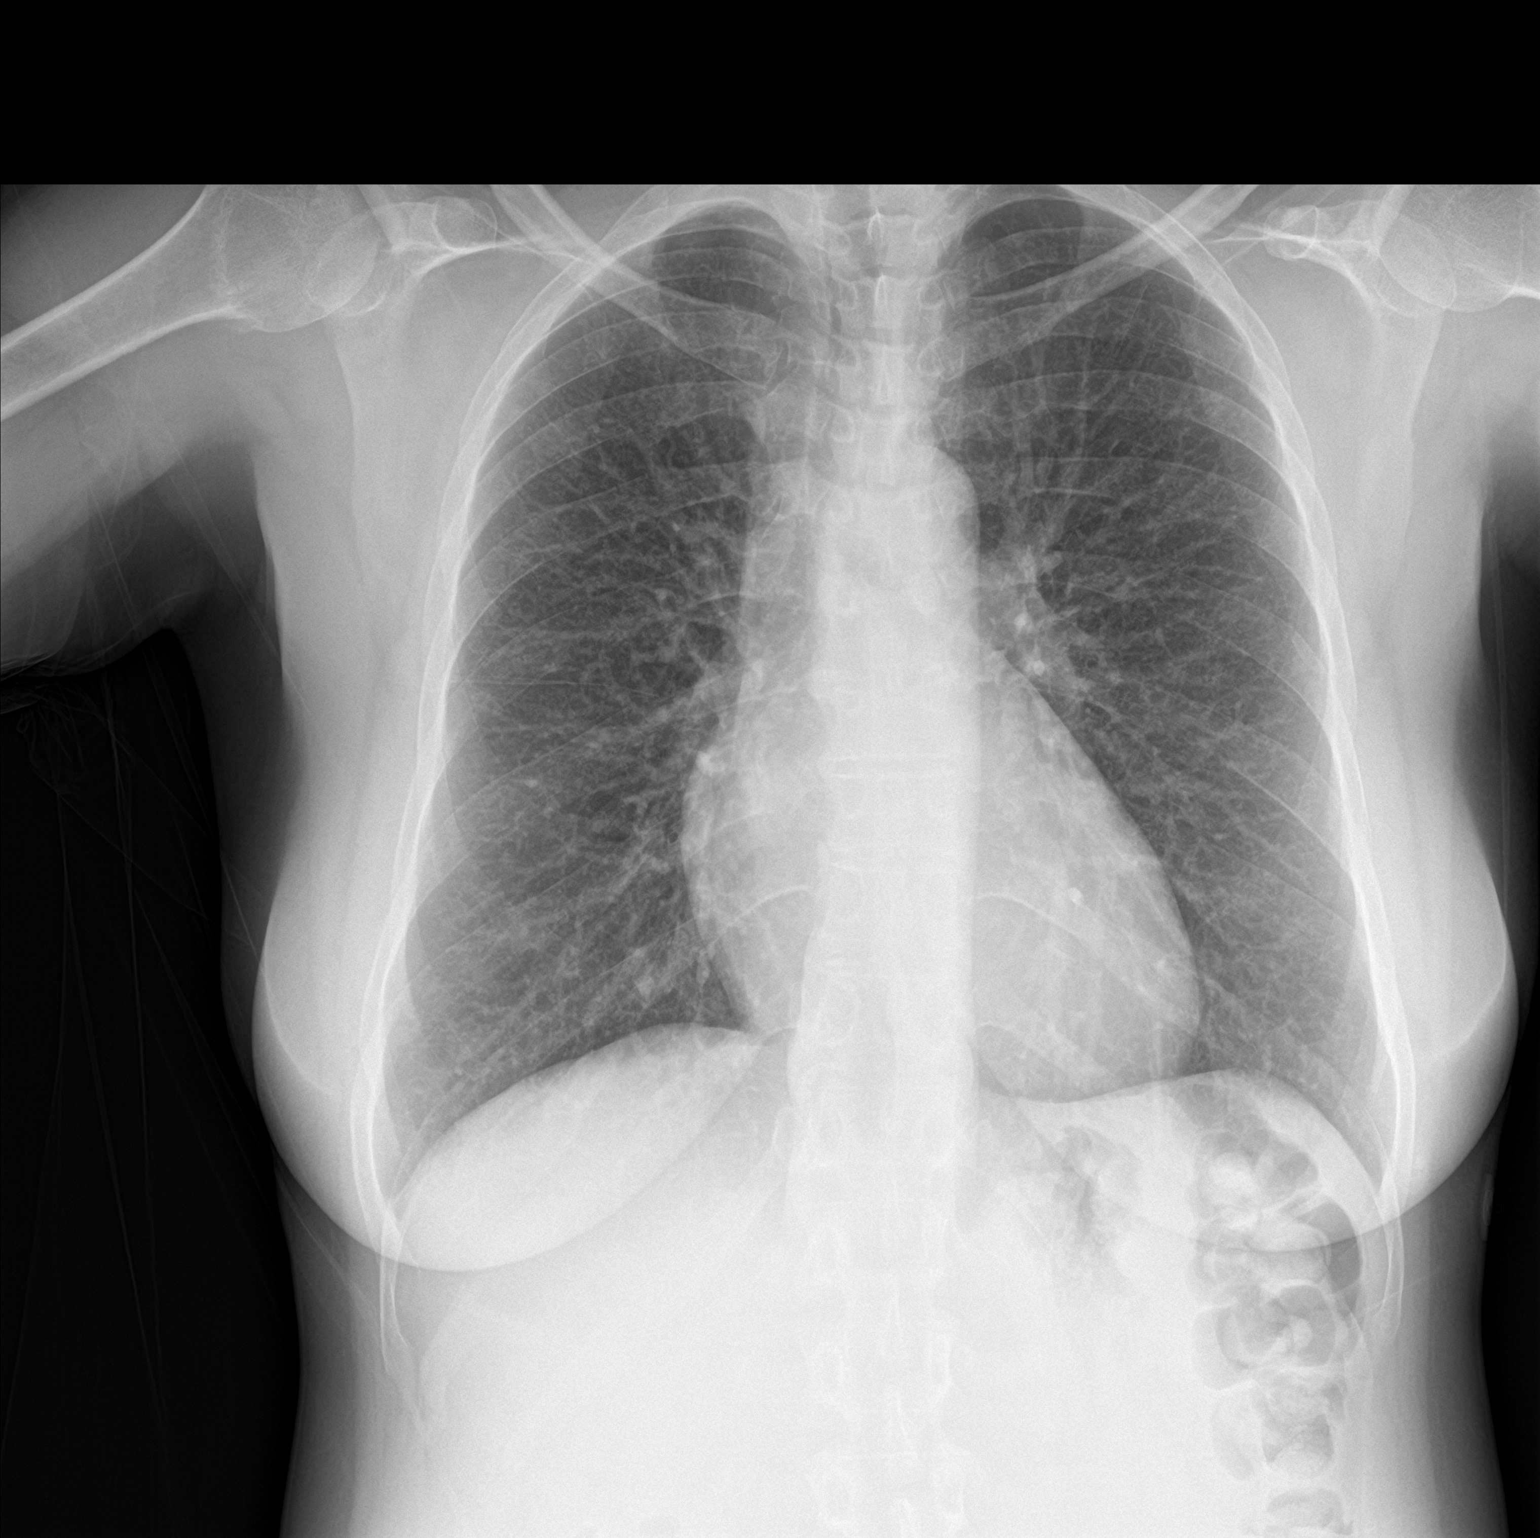

[chest lat]
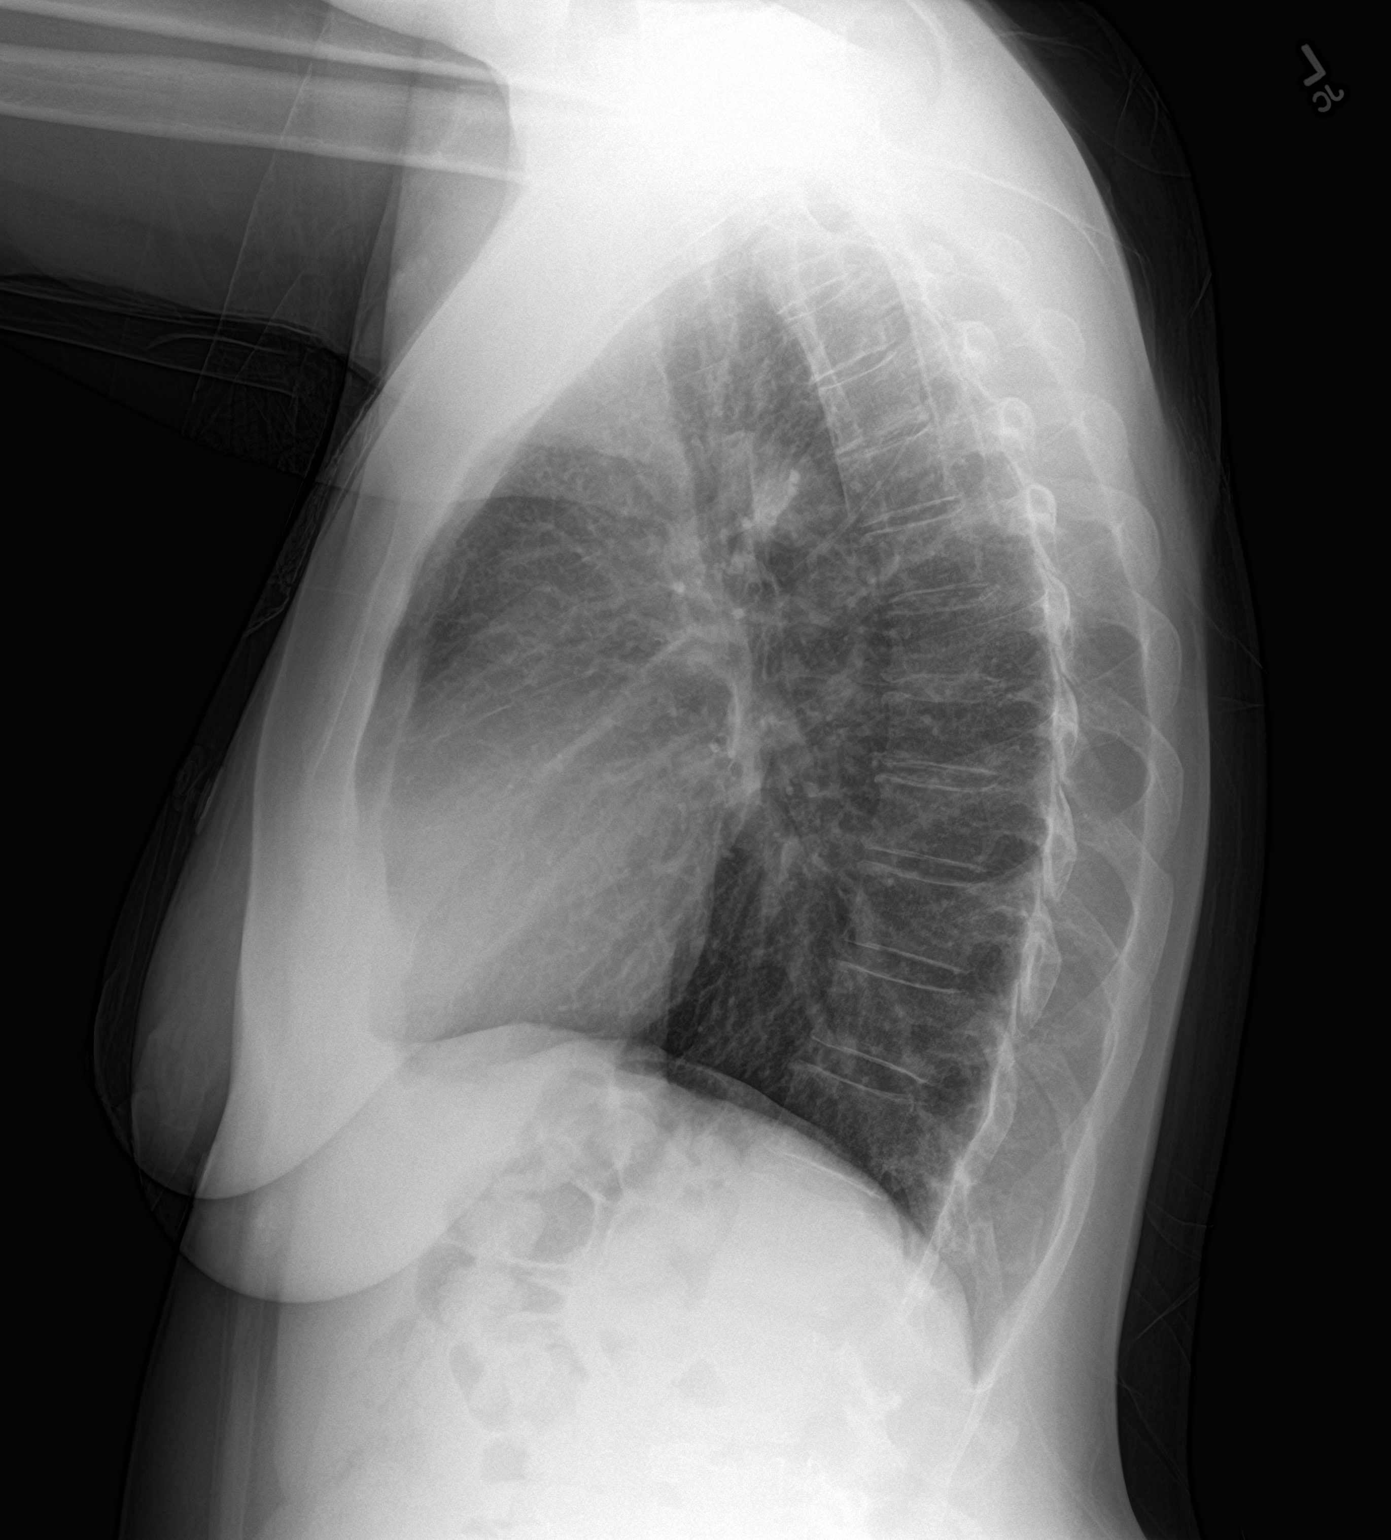

[2 of 2 positions shown; findings below may reference images not displayed]

FINDINGS: There is no evidence of an acute infiltrate, pleural effusion or
pneumothorax. The heart size and mediastinal contours are within
normal limits. The visualized skeletal structures are unremarkable.
IMPRESSION: No active cardiopulmonary disease.

## 2022-12-24 ENCOUNTER — Encounter: Payer: Self-pay | Admitting: Gastroenterology

## 2022-12-24 ENCOUNTER — Encounter (HOSPITAL_COMMUNITY): Payer: Self-pay

## 2022-12-24 ENCOUNTER — Ambulatory Visit (HOSPITAL_COMMUNITY)
Admission: RE | Admit: 2022-12-24 | Discharge: 2022-12-24 | Disposition: A | Discharge status: Home / Self Care | Payer: Medicaid Other | Source: Ambulatory Visit | Attending: Emergency Medicine | Admitting: Emergency Medicine

## 2022-12-24 VITALS — BP 165/96 | HR 93 | Temp 98.7°F | Resp 18

## 2022-12-24 DIAGNOSIS — K2900 Acute gastritis without bleeding: Secondary | ICD-10-CM

## 2022-12-24 DIAGNOSIS — D259 Leiomyoma of uterus, unspecified: Secondary | ICD-10-CM

## 2022-12-24 DIAGNOSIS — N939 Abnormal uterine and vaginal bleeding, unspecified: Secondary | ICD-10-CM | POA: Diagnosis not present

## 2022-12-24 MED ORDER — MYLANTA MAXIMUM STRENGTH 400-400-40 MG/5ML PO SUSP: 15.0000 mL | Freq: Four times a day (QID) | ORAL | 0 refills | Status: DC | PRN

## 2022-12-24 MED ORDER — LIDOCAINE VISCOUS HCL 2 % MT SOLN
OROMUCOSAL | Status: AC
  Filled 2022-12-24: qty 15

## 2022-12-24 MED ORDER — LIDOCAINE VISCOUS HCL 2 % MT SOLN
15.0000 mL | Freq: Once | OROMUCOSAL | Status: AC
  Administered 2022-12-24: 15 mL via OROMUCOSAL

## 2022-12-24 MED ORDER — ALUM & MAG HYDROXIDE-SIMETH 200-200-20 MG/5ML PO SUSP
30.0000 mL | Freq: Once | ORAL | Status: AC
  Administered 2022-12-24: 30 mL via ORAL

## 2022-12-24 MED ORDER — PANTOPRAZOLE SODIUM 40 MG PO TBEC: 40.0000 mg | DELAYED_RELEASE_TABLET | Freq: Every day | ORAL | 0 refills | Status: DC

## 2022-12-24 MED ORDER — ALUM & MAG HYDROXIDE-SIMETH 200-200-20 MG/5ML PO SUSP
ORAL | Status: AC
  Filled 2022-12-24: qty 30

## 2022-12-24 NOTE — ED Provider Notes (Signed)
[place]    CSN: [id number] Arrival date & time: 12/24/22  1423      History   Chief Complaint Chief Complaint  Patient presents with   Abdominal Pain    HPI Marie Atkins is a 52 y.o. female.  Here with 10/10 epigastric pain. For the last several months She has been seen for this in the past and has been referred to GI without following up Denies any vomiting. Normal BM. No fever. No hematemesis or hematochezia. No fever Denies any new foods or medications. Does not take chronic reflux meds  Also reports history of uterine fibroids causing abnormal bleeding for many months. Hx menorrhagia. LMP today  Additional history of stimulant use, cocaine use She reports current alcohol use and cigarette smoking  Past Medical History:  Diagnosis Date   Cocaine abuse ([place])    Depression    HTN (hypertension)    Iron deficiency anemia     Patient Active Problem List   Diagnosis Date Noted   Severe recurrent major depression without psychotic features ([place]) 06/24/2020   Menorrhagia 06/16/2020   Iron deficiency anemia due to chronic blood loss 06/16/2020   Proteinuria 06/16/2020   Depression 06/16/2020   Tobacco dependence due to cigarettes 06/16/2020   ASCUS with positive high risk HPV cervical 06/16/2020   Amphetamine and psychostimulant-induced psychosis with delusions ([place])     History reviewed. No pertinent surgical history.  OB History   No obstetric history on file.      Home Medications    Prior to Admission medications   Medication Sig Start Date End Date Taking? Authorizing Provider  alum & mag hydroxide-simeth (MYLANTA MAXIMUM STRENGTH) 400-400-40 MG/5ML suspension Take 15 mLs by mouth every 6 (six) hours as needed for indigestion. 12/24/22  Yes Asyia Hornung, Wells Guiles, PA-C  pantoprazole (PROTONIX) 40 MG tablet Take 1 tablet (40 mg total) by mouth daily before breakfast for 14 days. 12/24/22 01/07/23 Yes Arthor Gorter, Wells Guiles, PA-C  losartan (COZAAR) 50 MG  tablet Take 1 tablet (50 mg total) by mouth daily. 05/11/22   Raspet, Derry Skill, PA-C  sucralfate (CARAFATE) 1 g tablet Take 1 tablet (1 g total) by mouth 4 (four) times daily -  with meals and at bedtime. Dissolve 1 tablet in 30 mL of water and drink 4 times daily. 05/12/22   Raspet, Derry Skill, PA-C    Family History Family History  Problem Relation Age of Onset   Hypertension Mother    Diabetes Mother    CVA Mother    Heart attack Mother    Colon cancer Maternal Uncle     Social History Social History   Tobacco Use   Smoking status: Every Day    Types: Cigarettes   Smokeless tobacco: Never   Tobacco comments:    10 cigarettes/day  Vaping Use   Vaping Use: Never used  Substance Use Topics   Alcohol use: Yes    Comment: "sometimes every other day," "1 to 2 cans of beer or a glass or two of wine"   Drug use: Yes    Types: Cocaine    Comment: 3 to 4 times a week, last use on Thursday (06/22/2020)     Allergies   Ace inhibitors and Lisinopril   Review of Systems Review of Systems  Gastrointestinal:  Positive for abdominal pain.   As per HPI  Physical Exam Triage Vital Signs ED Triage Vitals  Enc Vitals Group     BP 12/24/22 1446 (!) 165/96  Pulse Rate 12/24/22 1446 93     Resp 12/24/22 1446 18     Temp 12/24/22 1446 98.7 F (37.1 C)     Temp src --      SpO2 12/24/22 1446 98 %     Weight --      Height --      Head Circumference --      Peak Flow --      Pain Score 12/24/22 1444 10     Pain Loc --      Pain Edu? --      Excl. in [place]? --    No data found.  Updated Vital Signs BP (!) 165/96   Pulse 93   Temp 98.7 F (37.1 C)   Resp 18   LMP 12/24/2022   SpO2 98%   Physical Exam Vitals and nursing note reviewed.  Constitutional:      Comments: Behavior is erratic throughout   HENT:     Mouth/Throat:     Mouth: Mucous membranes are moist.     Pharynx: Oropharynx is clear.  Eyes:     Conjunctiva/sclera: Conjunctivae normal.  Cardiovascular:      Rate and Rhythm: Normal rate and regular rhythm.     Pulses: Normal pulses.     Heart sounds: Normal heart sounds.  Pulmonary:     Effort: Pulmonary effort is normal.     Breath sounds: Normal breath sounds.  Abdominal:     General: Bowel sounds are normal.     Palpations: There is no mass.     Tenderness: There is no abdominal tenderness. There is no guarding or rebound. Negative signs include Murphy's sign and McBurney's sign.     Hernia: No hernia is present.  Musculoskeletal:     Cervical back: Normal range of motion.  Skin:    General: Skin is warm and dry.  Neurological:     General: No focal deficit present.     Mental Status: She is alert.  Psychiatric:        Attention and Perception: She is inattentive.        Mood and Affect: Affect is labile.        Speech: Speech is rapid and pressured and tangential.     UC Treatments / Results  Labs (all labs ordered are listed, but only abnormal results are displayed) Labs Reviewed - No data to display  EKG   Radiology No results found.  Procedures Procedures   Medications Ordered in UC Medications  alum & mag hydroxide-simeth (MAALOX/MYLANTA) 200-200-20 MG/5ML suspension 30 mL (30 mLs Oral Given 12/24/22 1517)  lidocaine (XYLOCAINE) 2 % viscous mouth solution 15 mL (15 mLs Mouth/Throat Given 12/24/22 1517)    Initial Impression / Assessment and Plan / UC Course  I have reviewed the triage vital signs and the nursing notes.  Pertinent labs & imaging results that were available during my care of the patient were reviewed by me and considered in my medical decision making (see chart for details).  Difficult history and exam as patient will go from pain free, sitting up and speaking, to rolling on the exam table in tears. She will mention one symptom and then go off on a tangent about unrelated issues She is overall non tender on exam Gave GI cocktail and patient reports immediate resolution in pain. Sent mylanta to  pharmacy to try. Discussed start Protonix once daily, diet changes, reduce alcohol consumption. I had patient call GI while in the room  and she set up appointment for next month. Discussed strict ED precautions in the meantime. Gave ob/gyn contact for follow up regarding fibroids  Final Clinical Impressions(s) / UC Diagnoses   Final diagnoses:  Acute gastritis without hemorrhage, unspecified gastritis type  Uterine leiomyoma, unspecified location  Vaginal bleeding     Discharge Instructions      Please follow with the stomach specialist AS SOON as possible. This is very important to further evaluate your stomach pain.  Take the Protonix once daily for the next 2 weeks.  You can use the mylanta (liquid) 3 times daily for the gastritis pain.  After you have scheduled with the stomach specialist, you can call the ob/gyn to set up appointment.     ED Prescriptions     Medication Sig Dispense Auth. Provider   alum & mag hydroxide-simeth (MYLANTA MAXIMUM STRENGTH) 400-400-40 MG/5ML suspension Take 15 mLs by mouth every 6 (six) hours as needed for indigestion. 355 mL Cruz Devilla, PA-C   pantoprazole (PROTONIX) 40 MG tablet Take 1 tablet (40 mg total) by mouth daily before breakfast for 14 days. 14 tablet Mirely Pangle, Wells Guiles, PA-C      PDMP not reviewed this encounter.   Brittin Belnap, Vernice Jefferson 12/24/22 2002

## 2022-12-24 NOTE — ED Triage Notes (Addendum)
Pt presents with upper abdominal pain that she has been seen for before but it is not improving. Reports she is supposed to be getting a referral for GI. She was diagnosed with acid reflux.   Pt also endorses months of vaginal bleeding. Pt is teary eyed during intake. Pt does not remember the name of her new blood pressure medicine but stopped taking it 3 weeks ago.

## 2022-12-24 NOTE — Discharge Instructions (Signed)
Please follow with the stomach specialist AS SOON as possible. This is very important to further evaluate your stomach pain.  Take the Protonix once daily for the next 2 weeks.  You can use the mylanta (liquid) 3 times daily for the gastritis pain.  After you have scheduled with the stomach specialist, you can call the ob/gyn to set up appointment.

## 2022-12-26 ENCOUNTER — Encounter: Payer: Medicaid Other | Admitting: Student

## 2023-01-29 ENCOUNTER — Ambulatory Visit (HOSPITAL_COMMUNITY)
Admission: EM | Admit: 2023-01-29 | Discharge: 2023-01-30 | Disposition: A | Discharge status: Home / Self Care | Payer: Medicaid Other | Attending: Psychiatry | Admitting: Psychiatry

## 2023-01-29 DIAGNOSIS — F1210 Cannabis abuse, uncomplicated: Secondary | ICD-10-CM | POA: Insufficient documentation

## 2023-01-29 DIAGNOSIS — Z91148 Patient's other noncompliance with medication regimen for other reason: Secondary | ICD-10-CM | POA: Insufficient documentation

## 2023-01-29 DIAGNOSIS — F1010 Alcohol abuse, uncomplicated: Secondary | ICD-10-CM

## 2023-01-29 DIAGNOSIS — Z9151 Personal history of suicidal behavior: Secondary | ICD-10-CM | POA: Insufficient documentation

## 2023-01-29 DIAGNOSIS — F339 Major depressive disorder, recurrent, unspecified: Secondary | ICD-10-CM

## 2023-01-29 DIAGNOSIS — F1410 Cocaine abuse, uncomplicated: Secondary | ICD-10-CM | POA: Insufficient documentation

## 2023-01-29 DIAGNOSIS — F319 Bipolar disorder, unspecified: Secondary | ICD-10-CM | POA: Insufficient documentation

## 2023-01-29 DIAGNOSIS — R45851 Suicidal ideations: Secondary | ICD-10-CM | POA: Insufficient documentation

## 2023-01-29 DIAGNOSIS — R109 Unspecified abdominal pain: Secondary | ICD-10-CM | POA: Insufficient documentation

## 2023-01-29 MED ORDER — HYDROXYZINE HCL 25 MG PO TABS: 25.0000 mg | ORAL_TABLET | Freq: Four times a day (QID) | ORAL | Status: DC | PRN

## 2023-01-29 MED ORDER — OLANZAPINE 5 MG PO TBDP: 5.0000 mg | ORAL_TABLET | Freq: Three times a day (TID) | ORAL | Status: DC | PRN

## 2023-01-29 MED ORDER — ALUM & MAG HYDROXIDE-SIMETH 200-200-20 MG/5ML PO SUSP: 30.0000 mL | ORAL | Status: DC | PRN

## 2023-01-29 MED ORDER — MAGNESIUM HYDROXIDE 400 MG/5ML PO SUSP: 30.0000 mL | Freq: Every day | ORAL | Status: DC | PRN

## 2023-01-29 MED ORDER — LORAZEPAM 1 MG PO TABS: 1.0000 mg | ORAL_TABLET | Freq: Two times a day (BID) | ORAL | Status: DC

## 2023-01-29 MED ORDER — ONDANSETRON 4 MG PO TBDP: 4.0000 mg | ORAL_TABLET | Freq: Four times a day (QID) | ORAL | Status: DC | PRN

## 2023-01-29 MED ORDER — LOPERAMIDE HCL 2 MG PO CAPS: 2.0000 mg | ORAL_CAPSULE | ORAL | Status: DC | PRN

## 2023-01-29 MED ORDER — LORAZEPAM 1 MG PO TABS: 1.0000 mg | ORAL_TABLET | Freq: Three times a day (TID) | ORAL | Status: DC

## 2023-01-29 MED ORDER — THIAMINE MONONITRATE 100 MG PO TABS: 100.0000 mg | ORAL_TABLET | Freq: Every day | ORAL | Status: DC

## 2023-01-29 MED ORDER — LORAZEPAM 1 MG PO TABS: 1.0000 mg | ORAL_TABLET | Freq: Four times a day (QID) | ORAL | Status: DC | PRN

## 2023-01-29 MED ORDER — ZIPRASIDONE MESYLATE 20 MG IM SOLR: 20.0000 mg | INTRAMUSCULAR | Status: DC | PRN

## 2023-01-29 MED ORDER — ACETAMINOPHEN 325 MG PO TABS: 650.0000 mg | ORAL_TABLET | Freq: Four times a day (QID) | ORAL | Status: DC | PRN

## 2023-01-29 MED ORDER — THIAMINE HCL 100 MG/ML IJ SOLN: 100.0000 mg | Freq: Once | INTRAMUSCULAR | Status: DC

## 2023-01-29 MED ORDER — LORAZEPAM 1 MG PO TABS: 1.0000 mg | ORAL_TABLET | Freq: Four times a day (QID) | ORAL | Status: DC

## 2023-01-29 MED ORDER — LORAZEPAM 1 MG PO TABS: 1.0000 mg | ORAL_TABLET | Freq: Every day | ORAL | Status: DC

## 2023-01-29 MED ORDER — ADULT MULTIVITAMIN W/MINERALS CH: 1.0000 | ORAL_TABLET | Freq: Every day | ORAL | Status: DC

## 2023-01-29 NOTE — ED Triage Notes (Signed)
Pt presents to [facility] voluntarily, accompanied by her boyfriend with complaint of suicidal ideation with no plan or intent and substance abuse. Pt reports relationship issues as current stressor at this time. Pt reports using crack and alcohol a little over an hour ago. Pt also reports visual hallucinations and seeing shadows and figures. Pt denies HI.

## 2023-01-29 NOTE — ED Provider Notes (Signed)
[facility] Continuous Assessment Admission H&P  Date: 01/30/23 Patient Name: Marie Atkins MRN: [id number] Chief Complaint: feeling depressed  Diagnoses:  Final diagnoses:  Alcohol abuse  Cocaine abuse ([place])  Marijuana abuse  Recurrent major depressive disorder, remission status unspecified ([place])    HPI: Marie Atkins 52 y/o female with a history of suicidal ideation, depression, bipolar disorder, alcohol abuse and polysubstance use.  Presented to [facility] accompanied by her boyfriend.  Per the patient she is feeling depressed and I have a drug problem according to patient she used crack cocaine and marijuana and alcohol tonight.  According to the patient she lives with her boyfriend she has stress which include her boyfriend and her family.  According to patient she is unemployed.  And according to patient she is feeling suicidal on and off when asked what is the plan patient stated not wanting to be here. According to patient she is not currently seeing a psychiatrist but is prescribed medicine but she has not been taking her medicines.  Patient is currently not seeing a therapist.   Face-to-face observation of patient, patient is alert and oriented x 4, speech is clear, maintained minimal eye contact.  Patient did answer all questions appropriately.  According to patient she is she does have suicide thoughts with no plans only stating that she does not want to be here.  Patient denies HI, patient report visual hallucination stating she sees shadows.  According to patient she used crack cocaine and marijuana and alcohol prior to coming in this evening.  According to patient she was last hospitalized for depression 1 week 3 years ago at [facility].   After TTS saw the patient,  TTS inform writer that patient has an endoscope in the morning,  writer try to get clarification,  write ask patient if she was going to cancelled the endoscope or will she be staying.  Pt go agitated and stated she want to leave.   According to TTS patient c/o of stomach pain,  when write saw patient shedid not c/o of stomach pain at that time.  During the time TTS saw the patient that when she c./o that she has stomach pain and she as an endoscope in the AM.  Write try get more information on patient condition however patient got really agitated and wanted to leave AMA.   Total Time spent with patient: 30 minutes  Musculoskeletal  Strength & Muscle Tone: within normal limits Gait & Station: normal Patient leans: N/A  Psychiatric Specialty Exam  Presentation General Appearance:  Casual  Eye Contact: Fair  Speech: Clear and Coherent  Speech Volume: Normal  Handedness: Right   Mood and Affect  Mood: Anxious; Depressed  Affect: Appropriate   Thought Process  Thought Processes: Coherent  Descriptions of Associations:Circumstantial  Orientation:Full (Time, Place and Person)  Thought Content:WDL    Hallucinations:Hallucinations: Visual Description of Visual Hallucinations: sometime see shadow  Ideas of Reference:None  Suicidal Thoughts:Suicidal Thoughts: Yes, Passive SI Passive Intent and/or Plan: Without Intent; Without Plan  Homicidal Thoughts:Homicidal Thoughts: No   Sensorium  Memory: Immediate Fair  Judgment: Poor  Insight: Poor   Executive Functions  Concentration: Fair  Attention Span: Fair  Recall: [place] of Knowledge: Fair  Language: Fair   Psychomotor Activity  Psychomotor Activity: Psychomotor Activity: Normal   Assets  Assets: Desire for Improvement; Resilience   Sleep  Sleep: Sleep: Fair   Nutritional Assessment (For OBS and FBC admissions only) Has the patient had a weight loss  or gain of 10 pounds or more in the last 3 months?: No Has the patient had a decrease in food intake/or appetite?: No Does the patient have dental problems?: No Does the patient have eating habits or behaviors that may be indicators of an eating disorder  including binging or inducing vomiting?: No Has the patient recently lost weight without trying?: 0 Has the patient been eating poorly because of a decreased appetite?: 0 Malnutrition Screening Tool Score: 0    Physical Exam HENT:     Head: Normocephalic.     Nose: Nose normal.  Cardiovascular:     Rate and Rhythm: Normal rate.  Pulmonary:     Effort: Pulmonary effort is normal.  Musculoskeletal:        General: Normal range of motion.     Cervical back: Normal range of motion.  Neurological:     General: No focal deficit present.     Mental Status: She is alert.  Psychiatric:        Mood and Affect: Mood normal.        Behavior: Behavior normal.        Thought Content: Thought content normal.        Judgment: Judgment normal.    Review of Systems  Constitutional: Negative.   HENT: Negative.    Eyes: Negative.   Respiratory: Negative.    Cardiovascular: Negative.   Gastrointestinal: Negative.   Genitourinary: Negative.   Musculoskeletal: Negative.   Skin: Negative.   Neurological: Negative.   Psychiatric/Behavioral:  Positive for depression, substance abuse and suicidal ideas. The patient is nervous/anxious.     Blood pressure (!) 177/97, pulse (!) 103, temperature 98.9 F (37.2 C), temperature source Oral, resp. rate 20, SpO2 100 %. There is no height or weight on file to calculate BMI.  Past Psychiatric History: Depression, bipolar disorder, polysubstance use abuse, alcohol abuse  Is the patient at risk to self? Yes  Has the patient been a risk to self in the past 6 months? Yes .    Has the patient been a risk to self within the distant past? Yes   Is the patient a risk to others? Yes   Has the patient been a risk to others in the past 6 months? No   Has the patient been a risk to others within the distant past? No   Past Medical History: See chart  Family History: Unknown  Social History: Cocaine-marijuana/alcohol  Last Labs:  No visits with results  within 6 Month(s) from this visit.  Latest known visit with results is:  Admission on 05/11/2022, Discharged on 05/11/2022  Component Date Value Ref Range Status   WBC 05/11/2022 12.2 (H)  4.0 - 10.5 K/uL Final   RBC 05/11/2022 4.25  3.87 - 5.11 MIL/uL Final   Hemoglobin 05/11/2022 10.7 (L)  12.0 - 15.0 g/dL Final   HCT 05/11/2022 32.7 (L)  36.0 - 46.0 % Final   MCV 05/11/2022 76.9 (L)  80.0 - 100.0 fL Final   MCH 05/11/2022 25.2 (L)  26.0 - 34.0 pg Final   MCHC 05/11/2022 32.7  30.0 - 36.0 g/dL Final   RDW 05/11/2022 19.0 (H)  11.5 - 15.5 % Final   Platelets 05/11/2022 448 (H)  150 - 400 K/uL Final   nRBC 05/11/2022 0.0  0.0 - 0.2 % Final   Neutrophils Relative % 05/11/2022 75  % Final   Neutro Abs 05/11/2022 9.2 (H)  1.7 - 7.7 K/uL Final   Lymphocytes Relative 05/11/2022  15  % Final   Lymphs Abs 05/11/2022 1.9  0.7 - 4.0 K/uL Final   Monocytes Relative 05/11/2022 7  % Final   Monocytes Absolute 05/11/2022 0.9  0.1 - 1.0 K/uL Final   Eosinophils Relative 05/11/2022 1  % Final   Eosinophils Absolute 05/11/2022 0.1  0.0 - 0.5 K/uL Final   Basophils Relative 05/11/2022 1  % Final   Basophils Absolute 05/11/2022 0.1  0.0 - 0.1 K/uL Final   Immature Granulocytes 05/11/2022 1  % Final   Abs Immature Granulocytes 05/11/2022 0.06  0.00 - 0.07 K/uL Final   Performed at [facility] [address], [place], [place] [address]   Sodium 05/11/2022 134 (L)  135 - 145 mmol/L Final   Potassium 05/11/2022 3.4 (L)  3.5 - 5.1 mmol/L Final   Chloride 05/11/2022 96 (L)  98 - 111 mmol/L Final   CO2 05/11/2022 26  22 - 32 mmol/L Final   Glucose, Bld 05/11/2022 103 (H)  70 - 99 mg/dL Final   Glucose reference range applies only to samples taken after fasting for at least 8 hours.   BUN 05/11/2022 8  6 - 20 mg/dL Final   Creatinine, Ser 05/11/2022 0.88  0.44 - 1.00 mg/dL Final   Calcium 05/11/2022 9.2  8.9 - 10.3 mg/dL Final   Total Protein 05/11/2022 7.8  6.5 - 8.1 g/dL Final   Albumin  05/11/2022 3.8  3.5 - 5.0 g/dL Final   AST 05/11/2022 18  15 - 41 U/L Final   ALT 05/11/2022 13  0 - 44 U/L Final   Alkaline Phosphatase 05/11/2022 90  38 - 126 U/L Final   Total Bilirubin 05/11/2022 0.3  0.3 - 1.2 mg/dL Final   GFR, Estimated 05/11/2022 >60  >60 mL/min Final   Comment: (NOTE) Calculated using the CKD-EPI Creatinine Equation (2021)    Anion gap 05/11/2022 12  5 - 15 Final   Performed at [facility] [address], [place], [place] [address]   Lipase 05/11/2022 31  11 - 51 U/L Final   Performed at [facility] [address] [place], [place] [address]    Allergies: Ace inhibitors and Lisinopril  Medications:  Facility Ordered Medications  Medication   acetaminophen (TYLENOL) tablet 650 mg   alum & mag hydroxide-simeth (MAALOX/MYLANTA) 200-200-20 MG/5ML suspension 30 mL   magnesium hydroxide (MILK OF MAGNESIA) suspension 30 mL   thiamine (VITAMIN B1) injection 100 mg   thiamine (VITAMIN B1) tablet 100 mg   multivitamin with minerals tablet 1 tablet   LORazepam (ATIVAN) tablet 1 mg   hydrOXYzine (ATARAX) tablet 25 mg   loperamide (IMODIUM) capsule 2-4 mg   ondansetron (ZOFRAN-ODT) disintegrating tablet 4 mg   LORazepam (ATIVAN) tablet 1 mg   Followed by   Derrill Memo ON 01/31/2023] LORazepam (ATIVAN) tablet 1 mg   Followed by   Derrill Memo ON 02/01/2023] LORazepam (ATIVAN) tablet 1 mg   Followed by   Derrill Memo ON 02/02/2023] LORazepam (ATIVAN) tablet 1 mg   OLANZapine zydis (ZYPREXA) disintegrating tablet 5 mg   And   ziprasidone (GEODON) injection 20 mg   PTA Medications  Medication Sig   losartan (COZAAR) 50 MG tablet Take 1 tablet (50 mg total) by mouth daily.   sucralfate (CARAFATE) 1 g tablet Take 1 tablet (1 g total) by mouth 4 (four) times daily -  with meals and at bedtime. Dissolve 1 tablet in 30 mL of water and drink 4 times daily.  alum & mag hydroxide-simeth (MYLANTA MAXIMUM STRENGTH) 400-400-40 MG/5ML suspension Take 15 mLs by mouth every 6  (six) hours as needed for indigestion.    Medical Decision Making  Inpatient observation    Recommendations  Based on my evaluation the patient appears to have an emergency medical condition for which I recommend the patient be transferred to the emergency department for further evaluation.  Evette Georges, NP 01/30/23  12:39 AM

## 2023-01-30 ENCOUNTER — Encounter: Payer: Self-pay | Admitting: Gastroenterology

## 2023-01-30 ENCOUNTER — Other Ambulatory Visit (INDEPENDENT_AMBULATORY_CARE_PROVIDER_SITE_OTHER): Payer: Medicaid Other

## 2023-01-30 ENCOUNTER — Ambulatory Visit (INDEPENDENT_AMBULATORY_CARE_PROVIDER_SITE_OTHER): Payer: Medicaid Other | Admitting: Gastroenterology

## 2023-01-30 VITALS — BP 158/94 | HR 65 | Ht 64.0 in | Wt 146.0 lb

## 2023-01-30 DIAGNOSIS — R634 Abnormal weight loss: Secondary | ICD-10-CM

## 2023-01-30 DIAGNOSIS — D649 Anemia, unspecified: Secondary | ICD-10-CM

## 2023-01-30 DIAGNOSIS — R1013 Epigastric pain: Secondary | ICD-10-CM

## 2023-01-30 DIAGNOSIS — R112 Nausea with vomiting, unspecified: Secondary | ICD-10-CM | POA: Diagnosis not present

## 2023-01-30 LAB — CBC WITH DIFFERENTIAL/PLATELET
Basophils Absolute: 0.1 10*3/uL (ref 0.0–0.1)
Basophils Relative: 1.4 % (ref 0.0–3.0)
Eosinophils Absolute: 0 10*3/uL (ref 0.0–0.7)
Eosinophils Relative: 0.4 % (ref 0.0–5.0)
HCT: 30.3 % — ABNORMAL LOW (ref 36.0–46.0)
Hemoglobin: 9.3 g/dL — ABNORMAL LOW (ref 12.0–15.0)
Lymphocytes Relative: 29.2 % (ref 12.0–46.0)
Lymphs Abs: 2.5 10*3/uL (ref 0.7–4.0)
MCHC: 30.7 g/dL (ref 30.0–36.0)
MCV: 74.1 fl — ABNORMAL LOW (ref 78.0–100.0)
Monocytes Absolute: 0.6 10*3/uL (ref 0.1–1.0)
Monocytes Relative: 7.1 % (ref 3.0–12.0)
Neutro Abs: 5.4 10*3/uL (ref 1.4–7.7)
Neutrophils Relative %: 61.9 % (ref 43.0–77.0)
Platelets: 412 10*3/uL — ABNORMAL HIGH (ref 150.0–400.0)
RBC: 4.09 Mil/uL (ref 3.87–5.11)
RDW: 18.2 % — ABNORMAL HIGH (ref 11.5–15.5)
WBC: 8.7 10*3/uL (ref 4.0–10.5)

## 2023-01-30 LAB — COMPREHENSIVE METABOLIC PANEL
ALT: 9 U/L (ref 0–35)
AST: 12 U/L (ref 0–37)
Albumin: 4 g/dL (ref 3.5–5.2)
Alkaline Phosphatase: 116 U/L (ref 39–117)
BUN: 8 mg/dL (ref 6–23)
CO2: 24 mEq/L (ref 19–32)
Calcium: 9.5 mg/dL (ref 8.4–10.5)
Chloride: 104 mEq/L (ref 96–112)
Creatinine, Ser: 0.78 mg/dL (ref 0.40–1.20)
GFR: 88.82 mL/min (ref >60.00)
Glucose, Bld: 104 mg/dL — ABNORMAL HIGH (ref 70–99)
Potassium: 3.9 mEq/L (ref 3.5–5.1)
Sodium: 135 mEq/L (ref 135–145)
Total Bilirubin: 0.2 mg/dL (ref 0.2–1.2)
Total Protein: 7.9 g/dL (ref 6.0–8.3)

## 2023-01-30 LAB — IBC + FERRITIN
Ferritin: 1 ng/mL — ABNORMAL LOW (ref 10.0–291.0)
Iron: 16 ug/dL — ABNORMAL LOW (ref 42–145)
Saturation Ratios: 3.4 % — ABNORMAL LOW (ref 20.0–50.0)
TIBC: 474.6 ug/dL — ABNORMAL HIGH (ref 250.0–450.0)
Transferrin: 339 mg/dL (ref 212.0–360.0)

## 2023-01-30 LAB — C-REACTIVE PROTEIN: CRP: <1 mg/dL (ref 0.5–20.0)

## 2023-01-30 LAB — B12 AND FOLATE PANEL
Folate: 7.6 ng/mL (ref >5.9)
Vitamin B-12: 809 pg/mL (ref 211–911)

## 2023-01-30 LAB — LIPASE: Lipase: 53 U/L (ref 11.0–59.0)

## 2023-01-30 MED ORDER — NA SULFATE-K SULFATE-MG SULF 17.5-3.13-1.6 GM/177ML PO SOLN: 1.0000 | Freq: Once | ORAL | 0 refills | Status: AC

## 2023-01-30 MED ORDER — DICYCLOMINE HCL 20 MG PO TABS: 20.0000 mg | ORAL_TABLET | Freq: Four times a day (QID) | ORAL | 1 refills | Status: DC | PRN
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
## 2023-01-30 NOTE — Progress Notes (Signed)
HPI : Marie Atkins is a pleasant 52 year old female with a history of depression, cocaine and alcohol abuse, and iron deficiency anemia who is referred to [place] by Dr. Nadeen Landau for further evaluation of abdominal pain and nausea and vomiting.  This has been a chronic problem for her and she has had multiple ER visits for these symptoms, but has never been seen by gastroenterology.   She reports having severe pain in her epigastrium and having nausea all the time.  The pain sometimes extends into her bilateral flanks and back as well.  The pain and nausea are worsened with eating and drinking.  Even drinking water makes her symptoms worse.  Sometimes she feels weak and light headed because of the pain.   The pain is described as sharp in character, sometimes burning.  It will last for hours at a time.  She thinks that this has been a problem for years, and that she only goes a few days at most without experiencing the pain.   She reports decreased appetite and she has lost 40lbs over the past year.  She denies dysphagia but sometimes she has difficulty initiating a swallow.  She does have frequent heartburn. She has pill bottles for pantoprazole and carafate which she initially says she just got from the ED last night, but the prescriptions on the bottles are from Nov 2023 and Jan 2024.  She then says that she has had them in the past but they don't help, but she has not taken them regularly. She does not take NSAIDs.  She has chronic iron deficiency anemia, which is most likely from menstrual bleeding, but a GI evaluation has never been completed.  The patient states that she had persistent, continuous heavy menstrual bleeding for 6 months straight. She has seen blood in the stool on occasion, both red blood and black stools but none recently She has long standing constipation.  No problems with chronic/recurrent diarrhea.  No known family history of GI malignancy.  Of note the patient was in  the ED last night with mental distress and vague suicidal thoughts after she had a relapse in her cocaine use.  She reports significant mental stress related to her relationship with her boyfriend.   She states that she left the ED against medical advise because the provider there was so rude to her.  She denies having any current suicidal thoughts and is doing a bit better than she was yesterday.   CT Renal stone, June 2022 IMPRESSION: 1. No acute findings within the abdomen or pelvis. No bowel obstruction or evidence of bowel wall inflammation. No evidence of acute solid organ abnormality. No renal or ureteral calculi. Appendix is normal. 2. Fairly large amount of stool throughout the colon (constipation?). 3. Mild colonic diverticulosis without evidence of acute diverticulitis. 4. Probable small uterine fibroids.  CT abdomen/pelvis, April 2022 Narrative & Impression  IMPRESSION: 1. Slight asymmetric right urinary tract dilatation with at most mild hydronephrosis albeit without visible obstructing urolith or lesion. Findings could reflect a recently passed stone or urinary tract infection. Correlate with urinalysis. 2. Hypoattenuating focus in the left lobe liver measuring 2 cm in size with nodular discontinuous peripheral enhancement demonstrating centripetal filling on delayed phase imaging. Appearance most suggestive of hepatic hemangioma in the absence of known malignancy or risk factors. 3. Probable fibroids within the uterus. Could consider outpatient pelvic ultrasound for further characterization. 4. Aortic Atherosclerosis (ICD10-I70.0)    Component Ref Range & Units 1  d ago (01/30/23) 8 mo ago (05/11/22) 10 mo ago (03/25/22) 1 yr ago (04/15/21) 1 yr ago (03/02/21) 2 yr ago (06/23/20) 2 yr ago (06/16/20)  WBC 4.0 - 10.5 K/uL 8.7 12.2 High  9.9 11.6 High  9.4 12.8 High  11.0 High  R  RBC 3.87 - 5.11 Mil/uL 4.09 4.25 R 4.47 R 4.21 R 3.63 Low  R 4.07 R 3.92 R   Hemoglobin 12.0 - 15.0 g/dL 9.3 Low  82.9 Low  56.2 Low  8.3 Low  CM 7.1 Low  CM 10.3 Low  9.7 Low  R  HCT 36.0 - 46.0 % 30.3 Low  32.7 Low  35.1 Low  29.5 Low  24.1 Low  33.1 Low  32.0 Low  R  MCV 78.0 - 100.0 fl 74.1 Low  76.9 Low  R 78.5 Low  R 70.1 Low  R 66.4 Low  R 81.3 R 82 R  MCHC 30.0 - 36.0 g/dL 13.0 86.5 78.4 69.6 Low  29.5 Low  31.1 30.3 Low  R  RDW 11.5 - 15.5 % 18.2 High  19.0 High  20.9 High  26.0 High  22.4 High  16.0 High  15.0 R  Platelets 150.0 - 400.0 K/uL 412.0 High  448 High  R 410 High  R 535 High  R 469 High  R, CM 413 High  R 424 R       Component Ref Range & Units 1 d ago 2 yr ago  Iron 42 - 145 ug/dL 16 Low    Transferrin 295.2 - 360.0 mg/dL 841.3   Saturation Ratios 20.0 - 50.0 % 3.4 Low    Ferritin 10.0 - 291.0 ng/mL 1.0 Low  11 Low  R  TIBC 250.0 - 450.0 mcg/dL 244.0 High       Past Medical History:  Diagnosis Date   Cocaine abuse (HCC)    Depression    HTN (hypertension)    Iron deficiency anemia      Past Surgical History:  Procedure Laterality Date   TUBAL LIGATION     Family History  Problem Relation Age of Onset   Hypertension Mother    Diabetes Mother    CVA Mother    Heart attack Mother    Colon cancer Maternal Uncle    Social History   Tobacco Use   Smoking status: Every Day    Types: Cigarettes   Smokeless tobacco: Never   Tobacco comments:    10 cigarettes/day  Vaping Use   Vaping Use: Never used  Substance Use Topics   Alcohol use: Yes    Comment: "sometimes every other day," "1 to 2 cans of beer or a glass or two of wine"   Drug use: Yes    Types: Cocaine    Comment: 3 to 4 times a week, last use on Thursday (06/22/2020)   Current Outpatient Medications  Medication Sig Dispense Refill   alum & mag hydroxide-simeth (MYLANTA MAXIMUM STRENGTH) 400-400-40 MG/5ML suspension Take 15 mLs by mouth every 6 (six) hours as needed for indigestion. 355 mL 0   amLODipine (NORVASC) 5 MG tablet Take 5 mg by mouth daily.      Iron, Ferrous Sulfate, 325 (65 Fe) MG TABS Take by mouth.     losartan (COZAAR) 50 MG tablet Take 1 tablet (50 mg total) by mouth daily. 30 tablet 2   sucralfate (CARAFATE) 1 g tablet Take 1 tablet (1 g total) by mouth 4 (four) times daily -  with meals and  at bedtime. Dissolve 1 tablet in 30 mL of water and drink 4 times daily. 120 tablet 0   pantoprazole (PROTONIX) 40 MG tablet Take 1 tablet (40 mg total) by mouth daily before breakfast for 14 days. 14 tablet 0   No current facility-administered medications for this visit.   Allergies  Allergen Reactions   Ace Inhibitors Other (See Comments)    Other reaction(s): Cough (ALLERGY/intolerance)   Lisinopril      Review of Systems: All systems reviewed and negative except where noted in HPI.    No results found.  Physical Exam: BP (!) 158/94   Pulse 65   Ht 5\' 4"  (1.626 m)   Wt 146 lb (66.2 kg)   BMI 25.06 kg/m  Constitutional: Pleasant,well-developed, African American female in no acute distress. HEENT: Normocephalic and atraumatic. Conjunctivae are normal. No scleral icterus. Neck supple.  Cardiovascular: Normal rate, regular rhythm.  Pulmonary/chest: Effort normal and breath sounds normal. No wheezing, rales or rhonchi. Abdominal: Soft, nondistended, tenderness to palpation in the epigastrium without rigidity or guarding.  Bowel sounds active throughout. There are no masses palpable. No hepatomegaly. Extremities: no edema Neurological: Alert and oriented to person place and time. Skin: Skin is warm and dry. No rashes noted. Psychiatric: Normal mood and affect. Behavior is normal.  Patient is briefly tearful when discussing her drug relapse and her relationship with her boyfriend.  CBC    Component Value Date/Time   WBC 12.2 (H) 05/11/2022 1822   RBC 4.25 05/11/2022 1822   HGB 10.7 (L) 05/11/2022 1822   HGB 9.7 (L) 06/16/2020 1611   HCT 32.7 (L) 05/11/2022 1822   HCT 32.0 (L) 06/16/2020 1611   PLT 448 (H)  05/11/2022 1822   PLT 424 06/16/2020 1611   MCV 76.9 (L) 05/11/2022 1822   MCV 82 06/16/2020 1611   MCH 25.2 (L) 05/11/2022 1822   MCHC 32.7 05/11/2022 1822   RDW 19.0 (H) 05/11/2022 1822   RDW 15.0 06/16/2020 1611   LYMPHSABS 1.9 05/11/2022 1822   LYMPHSABS 2.6 06/16/2020 1611   MONOABS 0.9 05/11/2022 1822   EOSABS 0.1 05/11/2022 1822   EOSABS 0.1 06/16/2020 1611   BASOSABS 0.1 05/11/2022 1822   BASOSABS 0.1 06/16/2020 1611    CMP     Component Value Date/Time   NA 134 (L) 05/11/2022 1822   K 3.4 (L) 05/11/2022 1822   CL 96 (L) 05/11/2022 1822   CO2 26 05/11/2022 1822   GLUCOSE 103 (H) 05/11/2022 1822   BUN 8 05/11/2022 1822   CREATININE 0.88 05/11/2022 1822   CALCIUM 9.2 05/11/2022 1822   PROT 7.8 05/11/2022 1822   ALBUMIN 3.8 05/11/2022 1822   AST 18 05/11/2022 1822   ALT 13 05/11/2022 1822   ALKPHOS 90 05/11/2022 1822   BILITOT 0.3 05/11/2022 1822   GFRNONAA >60 05/11/2022 1822   GFRAA >60 06/23/2020 1340     ASSESSMENT AND PLAN: 52 year old female with chronic abdominal pain, nausea, vomiting, weight loss, iron deficiency anemia and reported history of both black and frankly bloody stools, although none recently.  Multiple ED visits for these complaints, but only with 2 CT scans in 2022 with findings of kidney stones and uterine fibroids.  An EGD and colonoscopy are warranted to evaluate these complaints, although I suspect her anemia is secondary to menorrhagia.  In the meantime, I recommended she continue to take the pantoprazole daily and Carafate.  Will add bentyl to help with pain. Will recheck CBC and get iron/b12  panels.  Will also repeat CMP and lipase and get baseline CRP. She has an appointment next week with behavioral health to help her deal with her stressful relationship and substance abuse relapse.  Patient is aware that recent cocaine use will place her at increased risk of sedation complications and her procedures may be cancelled if she uses around the  time of her procedures.   Abdominal pain, Weight loss, Nausea/vomiting, anemia - EGD/Colonoscopy - Iron/B12 levels - CRP - CBC, CMP, lipase - Continue PPI/carafate - Start Bentyl 20 mg PO Q6hrs PRN  Abnormal uterine bleeding - Patient will follow up with gynecology  Depression/SI - Denies SI currently, has follow up with [facility].  The details, risks (including bleeding, perforation, infection, missed lesions, medication reactions and possible hospitalization or surgery if complications occur), benefits, and alternatives to EGD/colonoscopy with possible biopsy and possible polypectomy were discussed with the patient and she consents to proceed.   Guy Toney E. Tomasa Rand, MD [facility]   Merrilyn Puma, MD

## 2023-01-30 NOTE — Patient Instructions (Signed)
We have sent the following medications to your pharmacy for you to pick up at your convenience: Bentyl   You have been scheduled for an endoscopy and colonoscopy. Please follow the written instructions given to you at your visit today. Please pick up your prep supplies at the pharmacy within the next 1-3 days. If you use inhalers (even only as needed), please bring them with you on the day of your procedure.  Your provider has requested that you go to the basement level for lab work before leaving today. Press "B" on the elevator. The lab is located at the first door on the left as you exit the elevator.  _______________________________________________________  If your blood pressure at your visit was 140/90 or greater, please contact your primary care physician to follow up on this.  _______________________________________________________  If you are age 14 or older, your body mass index should be between 23-30. Your Body mass index is 25.06 kg/m. If this is out of the aforementioned range listed, please consider follow up with your Primary Care Provider.  If you are age 37 or younger, your body mass index should be between 19-25. Your Body mass index is 25.06 kg/m. If this is out of the aformentioned range listed, please consider follow up with your Primary Care Provider.   ________________________________________________________  The [facility] providers would like to encourage you to use [facility] to communicate with providers for non-urgent requests or questions.  Due to long hold times on the telephone, sending your provider a message by [facility] may be a faster and more efficient way to get a response.  Please allow 48 business hours for a response.  Please remember that this is for non-urgent requests.  _______________________________________________________

## 2023-03-01 ENCOUNTER — Emergency Department (HOSPITAL_COMMUNITY)
Admission: EM | Admit: 2023-03-01 | Discharge: 2023-03-02 | Disposition: A | Discharge status: Other Acute Inpatient Hospital | Payer: Medicaid Other | Source: Home / Self Care | Attending: Emergency Medicine | Admitting: Emergency Medicine

## 2023-03-01 ENCOUNTER — Other Ambulatory Visit: Payer: Self-pay

## 2023-03-01 ENCOUNTER — Emergency Department (HOSPITAL_COMMUNITY): Payer: Medicaid Other

## 2023-03-01 DIAGNOSIS — F1020 Alcohol dependence, uncomplicated: Secondary | ICD-10-CM | POA: Insufficient documentation

## 2023-03-01 DIAGNOSIS — R45851 Suicidal ideations: Secondary | ICD-10-CM

## 2023-03-01 DIAGNOSIS — S0990XA Unspecified injury of head, initial encounter: Secondary | ICD-10-CM | POA: Insufficient documentation

## 2023-03-01 DIAGNOSIS — R1013 Epigastric pain: Secondary | ICD-10-CM | POA: Insufficient documentation

## 2023-03-01 DIAGNOSIS — Z20822 Contact with and (suspected) exposure to covid-19: Secondary | ICD-10-CM | POA: Insufficient documentation

## 2023-03-01 DIAGNOSIS — M542 Cervicalgia: Secondary | ICD-10-CM | POA: Insufficient documentation

## 2023-03-01 DIAGNOSIS — M545 Low back pain, unspecified: Secondary | ICD-10-CM | POA: Insufficient documentation

## 2023-03-01 DIAGNOSIS — F332 Major depressive disorder, recurrent severe without psychotic features: Secondary | ICD-10-CM | POA: Insufficient documentation

## 2023-03-01 DIAGNOSIS — F1420 Cocaine dependence, uncomplicated: Secondary | ICD-10-CM | POA: Insufficient documentation

## 2023-03-01 DIAGNOSIS — W19XXXA Unspecified fall, initial encounter: Secondary | ICD-10-CM | POA: Insufficient documentation

## 2023-03-01 DIAGNOSIS — Y92830 Public park as the place of occurrence of the external cause: Secondary | ICD-10-CM | POA: Insufficient documentation

## 2023-03-01 DIAGNOSIS — S50812A Abrasion of left forearm, initial encounter: Secondary | ICD-10-CM | POA: Insufficient documentation

## 2023-03-01 DIAGNOSIS — S80212A Abrasion, left knee, initial encounter: Secondary | ICD-10-CM | POA: Insufficient documentation

## 2023-03-01 LAB — RAPID URINE DRUG SCREEN, HOSP PERFORMED
Amphetamines: NOT DETECTED
Barbiturates: NOT DETECTED
Benzodiazepines: NOT DETECTED
Cocaine: POSITIVE — AB
Opiates: NOT DETECTED
Tetrahydrocannabinol: NOT DETECTED

## 2023-03-01 LAB — CBC WITH DIFFERENTIAL/PLATELET
Abs Immature Granulocytes: 0.05 10*3/uL (ref 0.00–0.07)
Basophils Absolute: 0.1 10*3/uL (ref 0.0–0.1)
Basophils Relative: 1 %
Eosinophils Absolute: 0.1 10*3/uL (ref 0.0–0.5)
Eosinophils Relative: 1 %
HCT: 33.8 % — ABNORMAL LOW (ref 36.0–46.0)
Hemoglobin: 10.3 g/dL — ABNORMAL LOW (ref 12.0–15.0)
Immature Granulocytes: 1 %
Lymphocytes Relative: 19 %
Lymphs Abs: 2.1 10*3/uL (ref 0.7–4.0)
MCH: 23.8 pg — ABNORMAL LOW (ref 26.0–34.0)
MCHC: 30.5 g/dL (ref 30.0–36.0)
MCV: 78.2 fL — ABNORMAL LOW (ref 80.0–100.0)
Monocytes Absolute: 1 10*3/uL (ref 0.1–1.0)
Monocytes Relative: 9 %
Neutro Abs: 7.6 10*3/uL (ref 1.7–7.7)
Neutrophils Relative %: 69 %
Platelets: 199 10*3/uL (ref 150–400)
RBC: 4.32 MIL/uL (ref 3.87–5.11)
RDW: 22.7 % — ABNORMAL HIGH (ref 11.5–15.5)
WBC: 10.9 10*3/uL — ABNORMAL HIGH (ref 4.0–10.5)
nRBC: 0 % (ref 0.0–0.2)

## 2023-03-01 LAB — I-STAT BETA HCG BLOOD, ED (MC, WL, AP ONLY): I-stat hCG, quantitative: <5 m[IU]/mL (ref <5)

## 2023-03-01 LAB — PREGNANCY, URINE: Preg Test, Ur: NEGATIVE

## 2023-03-01 MED ORDER — ONDANSETRON HCL 4 MG/2ML IJ SOLN
4.0000 mg | Freq: Once | INTRAMUSCULAR | Status: AC
  Administered 2023-03-01: 4 mg via INTRAVENOUS
  Filled 2023-03-01: qty 2

## 2023-03-01 MED ORDER — LACTATED RINGERS IV BOLUS
1000.0000 mL | Freq: Once | INTRAVENOUS | Status: AC
  Administered 2023-03-01: 1000 mL via INTRAVENOUS

## 2023-03-01 MED ORDER — PANTOPRAZOLE SODIUM 40 MG IV SOLR
40.0000 mg | Freq: Once | INTRAVENOUS | Status: AC
  Administered 2023-03-01: 40 mg via INTRAVENOUS
  Filled 2023-03-01: qty 10
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
## 2023-03-01 NOTE — ED Triage Notes (Signed)
[place] [facility] [place]. [place] the park. She also states that she had some drugs about 3 hours ago. She fell and hit her left knee. EMS stated that pt  feels embarrassed about having suicidal ideations    BP 194/116 99%  84 HR

## 2023-03-01 NOTE — ED Notes (Signed)
Pt was dressed out into purple scrubs. Pt belongings placed 9-12 cabinet. 2 bags (with clothing, cell phone, purse)

## 2023-03-01 NOTE — ED Provider Notes (Signed)
[place] EMERGENCY DEPARTMENT AT [facility] Provider Note   CSN: [id number] Arrival date & time: 03/01/23  1259     History  Chief Complaint  Patient presents with   Fall   Suicidal    Marie Atkins is a 52 y.o. female.  Patient is a 52 year old female who has a history of depression, cocaine and alcohol abuse and chronic anemia who presents with multiple complaints.  Her main complaint is suicidal ideations.  She will not really talk about this and I cannot ascertain whether she has a plan.  She also states that she has been drinking and doing drugs for the last few days and has not been eating.  She says she has not eaten in 3 days.  She has some nausea and some upper abdominal pain.  She also had a fall in the park [place] EMS [place] up.  She does not know what led to the fall.  She does think she hit her head.  She has a little bit of neck and lower back pain.  She also has some pain in her left knee.  She denies any other injuries.  I did see on chart review that she recently saw a gastroenterologist who has a plan to do an endoscopy and colonoscopy but this has not been done yet.       Home Medications Prior to Admission medications   Medication Sig Start Date End Date Taking? Authorizing Provider  alum & mag hydroxide-simeth (MYLANTA MAXIMUM STRENGTH) 400-400-40 MG/5ML suspension Take 15 mLs by mouth every 6 (six) hours as needed for indigestion. 12/24/22   Rising, Lurena Joiner, PA-C  amLODipine (NORVASC) 5 MG tablet Take 5 mg by mouth daily. 09/26/22   [provider]  dicyclomine (BENTYL) 20 MG tablet Take 1 tablet (20 mg total) by mouth every 6 (six) hours as needed for spasms. 01/30/23   Jenel Lucks, MD  Iron, Ferrous Sulfate, 325 (65 Fe) MG TABS Take by mouth.    [provider]  losartan (COZAAR) 50 MG tablet Take 1 tablet (50 mg total) by mouth daily. 05/11/22   Raspet, Noberto Retort, PA-C  pantoprazole (PROTONIX) 40 MG tablet Take 1 tablet (40  mg total) by mouth daily before breakfast for 14 days. 12/24/22 01/07/23  Rising, Lurena Joiner, PA-C  sucralfate (CARAFATE) 1 g tablet Take 1 tablet (1 g total) by mouth 4 (four) times daily -  with meals and at bedtime. Dissolve 1 tablet in 30 mL of water and drink 4 times daily. 05/12/22   Raspet, Noberto Retort, PA-C      Allergies    Ace inhibitors and Lisinopril    Review of Systems   Review of Systems  Constitutional:  Positive for fatigue. Negative for chills, diaphoresis and fever.  HENT:  Negative for congestion, rhinorrhea and sneezing.   Eyes: Negative.   Respiratory:  Negative for cough, chest tightness and shortness of breath.   Cardiovascular:  Negative for chest pain and leg swelling.  Gastrointestinal:  Positive for abdominal pain and nausea. Negative for blood in stool, diarrhea and vomiting.  Genitourinary:  Negative for difficulty urinating, flank pain, frequency and hematuria.  Musculoskeletal:  Positive for arthralgias, back pain and neck pain.  Skin:  Negative for rash.  Neurological:  Positive for headaches. Negative for dizziness, speech difficulty, weakness and numbness.    Physical Exam Updated Vital Signs BP (!) 152/120 (BP Location: Right Arm)   Pulse 94   Temp 99.4 F (37.4 C) (  Oral)   Resp 16   SpO2 100%  Physical Exam Constitutional:      Appearance: She is well-developed.  HENT:     Head: Normocephalic and atraumatic.  Eyes:     Pupils: Pupils are equal, round, and reactive to light.  Neck:     Comments: Generalized tenderness throughout the cervical spine into the lower lumbosacral spine.  No pain to thoracic spine.  No step-offs or deformities. Cardiovascular:     Rate and Rhythm: Normal rate and regular rhythm.     Heart sounds: Normal heart sounds.  Pulmonary:     Effort: Pulmonary effort is normal. No respiratory distress.     Breath sounds: Normal breath sounds. No wheezing or rales.  Chest:     Chest wall: No tenderness.  Abdominal:     General:  Bowel sounds are normal.     Palpations: Abdomen is soft.     Tenderness: There is no abdominal tenderness. There is no guarding or rebound.  Musculoskeletal:        General: Normal range of motion.     Comments: Positive tenderness with an overlying abrasion to the left anterior knee.  There is no significant swelling.  No other pain in her hip or ankle.  Pedal pulses are intact.  No pain range of motion of the other extremities.  Neurovascular intact distally to left leg.  There is some superficial abrasions to the volar surface of her left forearm (she reports this is from self cutting)  Lymphadenopathy:     Cervical: No cervical adenopathy.  Skin:    General: Skin is warm and dry.     Findings: No rash.  Neurological:     General: No focal deficit present.     Mental Status: She is alert and oriented to person, place, and time.     ED Results / Procedures / Treatments   Labs (all labs ordered are listed, but only abnormal results are displayed) Labs Reviewed  CBC WITH DIFFERENTIAL/PLATELET - Abnormal; Notable for the following components:      Result Value   WBC 10.9 (*)    Hemoglobin 10.3 (*)    HCT 33.8 (*)    MCV 78.2 (*)    MCH 23.8 (*)    RDW 22.7 (*)    All other components within normal limits  RAPID URINE DRUG SCREEN, HOSP PERFORMED - Abnormal; Notable for the following components:   Cocaine POSITIVE (*)    All other components within normal limits  PREGNANCY, URINE  COMPREHENSIVE METABOLIC PANEL  LIPASE, BLOOD  ETHANOL  I-STAT BETA HCG BLOOD, ED ([facility], [facility], [facility] ONLY)    EKG None  Radiology DG Lumbar Spine Complete  Result Date: 03/01/2023 CLINICAL DATA:  Fall.  Back pain. EXAM: LUMBAR SPINE - COMPLETE 5 VIEW COMPARISON:  None Available. FINDINGS: 5 non rib-bearing lumbar type vertebral bodies are present. Vertebral body heights and alignment normal. Mild facet degenerative changes are noted bilaterally at L4-5 and L5-S1. IMPRESSION: 1. No acute abnormality. 2.  Mild facet degenerative changes at L4-5 and L5-S1. Electronically Signed   By: Marin Roberts M.D.   On: 03/01/2023 15:45   CT Head Wo Contrast  Result Date: 03/01/2023 CLINICAL DATA:  Larey Seat.  Hit head. EXAM: CT HEAD WITHOUT CONTRAST CT CERVICAL SPINE WITHOUT CONTRAST TECHNIQUE: Multidetector CT imaging of the head and cervical spine was performed following the standard protocol without intravenous contrast. Multiplanar CT image reconstructions of the cervical spine were also generated. RADIATION DOSE  REDUCTION: This exam was performed according to the departmental dose-optimization program which includes automated exposure control, adjustment of the mA and/or kV according to patient size and/or use of iterative reconstruction technique. COMPARISON:  None Available. FINDINGS: CT HEAD FINDINGS Brain: The ventricles are normal in size and configuration. No extra-axial fluid collections are identified. The gray-white differentiation is maintained. No CT findings for acute hemispheric infarction or intracranial hemorrhage. No mass lesions. The brainstem and cerebellum are normal. Vascular: No hyperdense vessels or obvious aneurysm. Skull: No acute skull fracture.  No bone lesion. Sinuses/Orbits: The paranasal sinuses and mastoid air cells are clear. The globes are intact. Other: No scalp lesions, laceration or hematoma. CT CERVICAL SPINE FINDINGS Motion degraded examination. Alignment: Grossly normal. Skull base and vertebrae: No obvious acute fracture but limited examination. Soft tissues and spinal canal: No gross abnormality. Disc levels:  No significant canal stenosis. Upper chest: No significant findings. Other: No significant findings. IMPRESSION: 1. No acute intracranial findings or skull fracture. 2. Motion degraded examination of the cervical spine without obvious acute fracture. Electronically Signed   By: Rudie Meyer M.D.   On: 03/01/2023 14:27   CT Cervical Spine Wo Contrast  Result Date:  03/01/2023 CLINICAL DATA:  Larey Seat.  Hit head. EXAM: CT HEAD WITHOUT CONTRAST CT CERVICAL SPINE WITHOUT CONTRAST TECHNIQUE: Multidetector CT imaging of the head and cervical spine was performed following the standard protocol without intravenous contrast. Multiplanar CT image reconstructions of the cervical spine were also generated. RADIATION DOSE REDUCTION: This exam was performed according to the departmental dose-optimization program which includes automated exposure control, adjustment of the mA and/or kV according to patient size and/or use of iterative reconstruction technique. COMPARISON:  None Available. FINDINGS: CT HEAD FINDINGS Brain: The ventricles are normal in size and configuration. No extra-axial fluid collections are identified. The gray-white differentiation is maintained. No CT findings for acute hemispheric infarction or intracranial hemorrhage. No mass lesions. The brainstem and cerebellum are normal. Vascular: No hyperdense vessels or obvious aneurysm. Skull: No acute skull fracture.  No bone lesion. Sinuses/Orbits: The paranasal sinuses and mastoid air cells are clear. The globes are intact. Other: No scalp lesions, laceration or hematoma. CT CERVICAL SPINE FINDINGS Motion degraded examination. Alignment: Grossly normal. Skull base and vertebrae: No obvious acute fracture but limited examination. Soft tissues and spinal canal: No gross abnormality. Disc levels:  No significant canal stenosis. Upper chest: No significant findings. Other: No significant findings. IMPRESSION: 1. No acute intracranial findings or skull fracture. 2. Motion degraded examination of the cervical spine without obvious acute fracture. Electronically Signed   By: Rudie Meyer M.D.   On: 03/01/2023 14:27   DG Knee Complete 4 Views Left  Result Date: 03/01/2023 CLINICAL DATA:  Fall EXAM: LEFT KNEE - COMPLETE 4 VIEW COMPARISON:  None Available. FINDINGS: No evidence of fracture, dislocation, or joint effusion. No  evidence of arthropathy or other focal bone abnormality. Soft tissues are unremarkable. IMPRESSION: No fracture or dislocation. Electronically Signed   By: Lorenza Cambridge M.D.   On: 03/01/2023 13:58    Procedures Procedures    Medications Ordered in ED Medications  lactated ringers bolus 1,000 mL (1,000 mLs Intravenous New Bag/Given 03/01/23 1639)  ondansetron (ZOFRAN) injection 4 mg (4 mg Intravenous Given 03/01/23 1633)  pantoprazole (PROTONIX) injection 40 mg (40 mg Intravenous Given 03/01/23 1633)    ED Course/ Medical Decision Making/ A&P  Medical Decision Making Amount and/or Complexity of Data Reviewed Labs: ordered. Radiology: ordered.  Risk Prescription drug management.   Patient is a 52 year old female who presents with alcohol use and suicidal ideations.  She does have some cutting on her arms.  She appeared to be intoxicated on arrival although after observation in the ED, is more fully alert and oriented.  Her EtOH level is still pending due to lab issues.  Her CBC is nonconcerning.  hCG is negative.  Urine drug screen is positive for cocaine.  She is still complaining of suicidal ideations.  TTS evaluation ordered.  She had some pain in her epigastric area and was given some Protonix.  The pain has greatly improved.  I suspect this was gastritis.  Lipase and CMP are still pending.  Dr. Nicanor Alcon to follow-up on this.  Final Clinical Impression(s) / ED Diagnoses Final diagnoses:  Suicidal ideation  Epigastric pain    Rx / DC Orders ED Discharge Orders     None         Rolan Bucco, MD 03/01/23 2326

## 2023-03-01 NOTE — ED Notes (Signed)
Pt was able to walk with no assistant. Urine sample was collected and sent to lab.

## 2023-03-01 NOTE — ED Notes (Signed)
Pt resting in bed with no acute distress noted at this time. Awaiting TTS consult.

## 2023-03-02 ENCOUNTER — Inpatient Hospital Stay (HOSPITAL_COMMUNITY)
Admission: AD | Admit: 2023-03-02 | Discharge: 2023-03-09 | DRG: 885 | Disposition: A | Discharge status: Home / Self Care | Payer: Medicaid Other | Source: Intra-hospital | Attending: Psychiatry | Admitting: Psychiatry

## 2023-03-02 ENCOUNTER — Encounter (HOSPITAL_COMMUNITY): Payer: Self-pay | Admitting: Nurse Practitioner

## 2023-03-02 DIAGNOSIS — F1010 Alcohol abuse, uncomplicated: Secondary | ICD-10-CM | POA: Diagnosis present

## 2023-03-02 DIAGNOSIS — K30 Functional dyspepsia: Secondary | ICD-10-CM | POA: Diagnosis present

## 2023-03-02 DIAGNOSIS — Z91199 Patient's noncompliance with other medical treatment and regimen due to unspecified reason: Secondary | ICD-10-CM

## 2023-03-02 DIAGNOSIS — X788XXA Intentional self-harm by other sharp object, initial encounter: Secondary | ICD-10-CM | POA: Diagnosis present

## 2023-03-02 DIAGNOSIS — K5900 Constipation, unspecified: Secondary | ICD-10-CM | POA: Diagnosis present

## 2023-03-02 DIAGNOSIS — F1420 Cocaine dependence, uncomplicated: Secondary | ICD-10-CM | POA: Diagnosis present

## 2023-03-02 DIAGNOSIS — K219 Gastro-esophageal reflux disease without esophagitis: Secondary | ICD-10-CM | POA: Diagnosis present

## 2023-03-02 DIAGNOSIS — E039 Hypothyroidism, unspecified: Secondary | ICD-10-CM | POA: Diagnosis present

## 2023-03-02 DIAGNOSIS — Z20822 Contact with and (suspected) exposure to covid-19: Secondary | ICD-10-CM | POA: Diagnosis present

## 2023-03-02 DIAGNOSIS — F1020 Alcohol dependence, uncomplicated: Secondary | ICD-10-CM | POA: Diagnosis present

## 2023-03-02 DIAGNOSIS — F419 Anxiety disorder, unspecified: Secondary | ICD-10-CM | POA: Diagnosis present

## 2023-03-02 DIAGNOSIS — Z79899 Other long term (current) drug therapy: Secondary | ICD-10-CM

## 2023-03-02 DIAGNOSIS — F1090 Alcohol use, unspecified, uncomplicated: Secondary | ICD-10-CM | POA: Diagnosis present

## 2023-03-02 DIAGNOSIS — Z79890 Hormone replacement therapy (postmenopausal): Secondary | ICD-10-CM | POA: Diagnosis not present

## 2023-03-02 DIAGNOSIS — G4700 Insomnia, unspecified: Secondary | ICD-10-CM | POA: Diagnosis present

## 2023-03-02 DIAGNOSIS — F333 Major depressive disorder, recurrent, severe with psychotic symptoms: Secondary | ICD-10-CM | POA: Diagnosis not present

## 2023-03-02 DIAGNOSIS — R45851 Suicidal ideations: Secondary | ICD-10-CM | POA: Diagnosis present

## 2023-03-02 DIAGNOSIS — F17210 Nicotine dependence, cigarettes, uncomplicated: Secondary | ICD-10-CM | POA: Diagnosis present

## 2023-03-02 DIAGNOSIS — F1290 Cannabis use, unspecified, uncomplicated: Secondary | ICD-10-CM | POA: Diagnosis present

## 2023-03-02 DIAGNOSIS — F1410 Cocaine abuse, uncomplicated: Secondary | ICD-10-CM | POA: Diagnosis present

## 2023-03-02 DIAGNOSIS — I10 Essential (primary) hypertension: Secondary | ICD-10-CM | POA: Diagnosis present

## 2023-03-02 DIAGNOSIS — Z5986 Financial insecurity: Secondary | ICD-10-CM

## 2023-03-02 DIAGNOSIS — F17200 Nicotine dependence, unspecified, uncomplicated: Secondary | ICD-10-CM | POA: Diagnosis present

## 2023-03-02 LAB — COMPREHENSIVE METABOLIC PANEL
ALT: 15 U/L (ref 0–44)
AST: 21 U/L (ref 15–41)
Albumin: 3.7 g/dL (ref 3.5–5.0)
Alkaline Phosphatase: 93 U/L (ref 38–126)
Anion gap: 8 (ref 5–15)
BUN: 9 mg/dL (ref 6–20)
CO2: 20 mmol/L — ABNORMAL LOW (ref 22–32)
Calcium: 9.2 mg/dL (ref 8.9–10.3)
Chloride: 108 mmol/L (ref 98–111)
Creatinine, Ser: 0.69 mg/dL (ref 0.44–1.00)
GFR, Estimated: >60 mL/min (ref >60)
Glucose, Bld: 94 mg/dL (ref 70–99)
Potassium: 4.5 mmol/L (ref 3.5–5.1)
Sodium: 136 mmol/L (ref 135–145)
Total Bilirubin: 0.2 mg/dL — ABNORMAL LOW (ref 0.3–1.2)
Total Protein: 7.9 g/dL (ref 6.5–8.1)

## 2023-03-02 LAB — LIPASE, BLOOD: Lipase: 28 U/L (ref 11–51)

## 2023-03-02 LAB — SARS CORONAVIRUS 2 BY RT PCR: SARS Coronavirus 2 by RT PCR: NEGATIVE

## 2023-03-02 MED ORDER — DIPHENHYDRAMINE HCL 25 MG PO CAPS: 50.0000 mg | ORAL_CAPSULE | Freq: Three times a day (TID) | ORAL | Status: DC | PRN

## 2023-03-02 MED ORDER — HALOPERIDOL 5 MG PO TABS: 5.0000 mg | ORAL_TABLET | Freq: Three times a day (TID) | ORAL | Status: DC | PRN

## 2023-03-02 MED ORDER — LORAZEPAM 1 MG PO TABS
1.0000 mg | ORAL_TABLET | Freq: Four times a day (QID) | ORAL | Status: AC
  Administered 2023-03-02 – 2023-03-03 (×4): 1 mg via ORAL
  Filled 2023-03-02 (×4): qty 1

## 2023-03-02 MED ORDER — HALOPERIDOL LACTATE 5 MG/ML IJ SOLN: 5.0000 mg | Freq: Three times a day (TID) | INTRAMUSCULAR | Status: DC | PRN

## 2023-03-02 MED ORDER — IBUPROFEN 200 MG PO TABS
600.0000 mg | ORAL_TABLET | Freq: Three times a day (TID) | ORAL | Status: DC | PRN
  Administered 2023-03-02: 600 mg via ORAL
  Filled 2023-03-02: qty 3

## 2023-03-02 MED ORDER — ONDANSETRON 4 MG PO TBDP: 4.0000 mg | ORAL_TABLET | Freq: Four times a day (QID) | ORAL | Status: AC | PRN

## 2023-03-02 MED ORDER — LORAZEPAM 1 MG PO TABS: 1.0000 mg | ORAL_TABLET | ORAL | Status: DC | PRN

## 2023-03-02 MED ORDER — THIAMINE HCL 100 MG/ML IJ SOLN
100.0000 mg | Freq: Once | INTRAMUSCULAR | Status: AC
  Administered 2023-03-02: 100 mg via INTRAMUSCULAR
  Filled 2023-03-02: qty 2

## 2023-03-02 MED ORDER — HYDROXYZINE HCL 25 MG PO TABS: 25.0000 mg | ORAL_TABLET | Freq: Three times a day (TID) | ORAL | Status: DC | PRN

## 2023-03-02 MED ORDER — ADULT MULTIVITAMIN W/MINERALS CH
1.0000 | ORAL_TABLET | Freq: Every day | ORAL | Status: DC
  Administered 2023-03-02 – 2023-03-09 (×8): 1 via ORAL
  Filled 2023-03-02 (×11): qty 1

## 2023-03-02 MED ORDER — DIPHENHYDRAMINE HCL 50 MG/ML IJ SOLN: 50.0000 mg | Freq: Three times a day (TID) | INTRAMUSCULAR | Status: DC | PRN

## 2023-03-02 MED ORDER — ALUM & MAG HYDROXIDE-SIMETH 200-200-20 MG/5ML PO SUSP: 30.0000 mL | Freq: Four times a day (QID) | ORAL | Status: DC | PRN

## 2023-03-02 MED ORDER — VITAMIN B-1 100 MG PO TABS
100.0000 mg | ORAL_TABLET | Freq: Every day | ORAL | Status: DC
  Administered 2023-03-03 – 2023-03-09 (×7): 100 mg via ORAL
  Filled 2023-03-02 (×10): qty 1

## 2023-03-02 MED ORDER — LORAZEPAM 1 MG PO TABS: 1.0000 mg | ORAL_TABLET | Freq: Four times a day (QID) | ORAL | Status: AC | PRN

## 2023-03-02 MED ORDER — CLONIDINE HCL 0.1 MG PO TABS
0.1000 mg | ORAL_TABLET | Freq: Once | ORAL | Status: AC
  Administered 2023-03-02: 0.1 mg via ORAL
  Filled 2023-03-02 (×2): qty 1

## 2023-03-02 MED ORDER — NICOTINE 14 MG/24HR TD PT24
14.0000 mg | MEDICATED_PATCH | Freq: Every day | TRANSDERMAL | Status: DC
  Filled 2023-03-02 (×11): qty 1

## 2023-03-02 MED ORDER — ALUM & MAG HYDROXIDE-SIMETH 200-200-20 MG/5ML PO SUSP
30.0000 mL | ORAL | Status: DC | PRN
  Administered 2023-03-03: 30 mL via ORAL

## 2023-03-02 MED ORDER — AMLODIPINE BESYLATE 5 MG PO TABS
5.0000 mg | ORAL_TABLET | Freq: Every day | ORAL | Status: DC
  Administered 2023-03-02: 5 mg via ORAL
  Filled 2023-03-02: qty 1

## 2023-03-02 MED ORDER — LORAZEPAM 1 MG PO TABS: 2.0000 mg | ORAL_TABLET | Freq: Three times a day (TID) | ORAL | Status: DC | PRN

## 2023-03-02 MED ORDER — ZIPRASIDONE MESYLATE 20 MG IM SOLR: 20.0000 mg | INTRAMUSCULAR | Status: DC | PRN

## 2023-03-02 MED ORDER — LOPERAMIDE HCL 2 MG PO CAPS
2.0000 mg | ORAL_CAPSULE | ORAL | Status: AC | PRN
  Administered 2023-03-03: 2 mg via ORAL
  Filled 2023-03-02: qty 1

## 2023-03-02 MED ORDER — HYDROXYZINE HCL 25 MG PO TABS
25.0000 mg | ORAL_TABLET | Freq: Four times a day (QID) | ORAL | Status: AC | PRN
  Administered 2023-03-03 – 2023-03-04 (×3): 25 mg via ORAL
  Filled 2023-03-02 (×3): qty 1

## 2023-03-02 MED ORDER — TRAZODONE HCL 50 MG PO TABS
50.0000 mg | ORAL_TABLET | Freq: Every evening | ORAL | Status: DC | PRN
  Administered 2023-03-02 – 2023-03-03 (×2): 50 mg via ORAL
  Filled 2023-03-02 (×2): qty 1

## 2023-03-02 MED ORDER — LORAZEPAM 1 MG PO TABS: 1.0000 mg | ORAL_TABLET | Freq: Two times a day (BID) | ORAL | Status: DC

## 2023-03-02 MED ORDER — ACETAMINOPHEN 325 MG PO TABS
650.0000 mg | ORAL_TABLET | Freq: Four times a day (QID) | ORAL | Status: DC | PRN
  Administered 2023-03-04 – 2023-03-07 (×3): 650 mg via ORAL
  Filled 2023-03-02 (×3): qty 2

## 2023-03-02 MED ORDER — RISPERIDONE 2 MG PO TBDP: 2.0000 mg | ORAL_TABLET | Freq: Three times a day (TID) | ORAL | Status: DC | PRN

## 2023-03-02 MED ORDER — AMLODIPINE BESYLATE 5 MG PO TABS
5.0000 mg | ORAL_TABLET | Freq: Every day | ORAL | Status: DC
  Administered 2023-03-02 – 2023-03-03 (×2): 5 mg via ORAL
  Filled 2023-03-02 (×6): qty 1

## 2023-03-02 MED ORDER — LORAZEPAM 1 MG PO TABS: 1.0000 mg | ORAL_TABLET | Freq: Three times a day (TID) | ORAL | Status: DC

## 2023-03-02 MED ORDER — MAGNESIUM HYDROXIDE 400 MG/5ML PO SUSP
30.0000 mL | Freq: Every day | ORAL | Status: DC | PRN
  Administered 2023-03-05: 30 mL via ORAL
  Filled 2023-03-02: qty 30

## 2023-03-02 MED ORDER — CHLORDIAZEPOXIDE HCL 25 MG PO CAPS: 50.0000 mg | ORAL_CAPSULE | Freq: Once | ORAL | Status: DC

## 2023-03-02 MED ORDER — LORAZEPAM 1 MG PO TABS: 1.0000 mg | ORAL_TABLET | Freq: Every day | ORAL | Status: DC

## 2023-03-02 MED ORDER — LORAZEPAM 2 MG/ML IJ SOLN: 2.0000 mg | Freq: Three times a day (TID) | INTRAMUSCULAR | Status: DC | PRN

## 2023-03-02 NOTE — ED Notes (Signed)
ED TO INPATIENT HANDOFF REPORT  ED Nurse Name and Phone #: Dessa Phi  S Name/Age/Gender Marie Atkins 52 y.o. female Room/Bed: WHALC/WHALC  Code Status   Code Status: Full Code  Home/SNF/Other Home Patient oriented to: self, place, time, and situation Is this baseline? Yes   Triage Complete: Triage complete  Chief Complaint SI  Triage Note [place] [facility] [place]. [place]. She also states that she had some drugs about 3 hours ago. She fell and hit her left knee. EMS stated that pt  feels embarrassed about having suicidal ideations    BP 194/116 99%  84 HR   Allergies Allergies  Allergen Reactions   Ace Inhibitors Other (See Comments)    Other reaction(s): Cough (ALLERGY/intolerance)   Lisinopril     Level of Care/Admitting Diagnosis ED Disposition     None       B Medical/Surgery History Past Medical History:  Diagnosis Date   Cocaine abuse (HCC)    Depression    HTN (hypertension)    Iron deficiency anemia    Past Surgical History:  Procedure Laterality Date   TUBAL LIGATION       A IV Location/Drains/Wounds Patient Lines/Drains/Airways Status     Active Line/Drains/Airways     None            Intake/Output Last 24 hours  Intake/Output Summary (Last 24 hours) at 03/02/2023 0932 Last data filed at 03/01/2023 1800 Gross per 24 hour  Intake 1000 ml  Output --  Net 1000 ml    Labs/Imaging Results for orders placed or performed during the hospital encounter of 03/01/23 (from the past 48 hour(s))  Urine rapid drug screen (hosp performed)     Status: Abnormal   Collection Time: 03/01/23  2:20 PM  Result Value Ref Range   Opiates NONE DETECTED NONE DETECTED   Cocaine POSITIVE (A) NONE DETECTED   Benzodiazepines NONE DETECTED NONE DETECTED   Amphetamines NONE DETECTED NONE DETECTED   Tetrahydrocannabinol NONE DETECTED NONE DETECTED   Barbiturates NONE DETECTED NONE  DETECTED    Comment: (NOTE) DRUG SCREEN FOR MEDICAL PURPOSES ONLY.  IF CONFIRMATION IS NEEDED FOR ANY PURPOSE, NOTIFY LAB WITHIN 5 DAYS.  LOWEST DETECTABLE LIMITS FOR URINE DRUG SCREEN Drug Class                     Cutoff (ng/mL) Amphetamine and metabolites    1000 Barbiturate and metabolites    200 Benzodiazepine                 200 Opiates and metabolites        300 Cocaine and metabolites        300 THC                            50 Performed at [facility], [address] [place], [place] [address]   Pregnancy, urine     Status: None   Collection Time: 03/01/23  2:20 PM  Result Value Ref Range   Preg Test, Ur NEGATIVE NEGATIVE    Comment:        THE SENSITIVITY OF THIS METHODOLOGY IS >20 mIU/mL. Performed at [facility], [address], [place], [place] [address]   Comprehensive metabolic panel     Status: Abnormal   Collection Time: 03/01/23  2:55 PM  Result Value  Ref Range   Sodium 136 135 - 145 mmol/L   Potassium 4.5 3.5 - 5.1 mmol/L   Chloride 108 98 - 111 mmol/L   CO2 20 (L) 22 - 32 mmol/L   Glucose, Bld 94 70 - 99 mg/dL    Comment: Glucose reference range applies only to samples taken after fasting for at least 8 hours.   BUN 9 6 - 20 mg/dL   Creatinine, Ser 1.61 0.44 - 1.00 mg/dL   Calcium 9.2 8.9 - 09.6 mg/dL   Total Protein 7.9 6.5 - 8.1 g/dL   Albumin 3.7 3.5 - 5.0 g/dL   AST 21 15 - 41 U/L   ALT 15 0 - 44 U/L   Alkaline Phosphatase 93 38 - 126 U/L   Total Bilirubin 0.2 (L) 0.3 - 1.2 mg/dL   GFR, Estimated >04 >54 mL/min    Comment: (NOTE) Calculated using the CKD-EPI Creatinine Equation (2021)    Anion gap 8 5 - 15    Comment: Performed at [facility], [address] [place], [place] [address]  Lipase, blood     Status: None   Collection Time: 03/01/23  2:55 PM  Result Value Ref Range   Lipase 28 11 - 51 U/L    Comment: Performed at [facility], [address], [place], [place] [address]   CBC with Differential     Status: Abnormal   Collection Time: 03/01/23  2:55 PM  Result Value Ref Range   WBC 10.9 (H) 4.0 - 10.5 K/uL   RBC 4.32 3.87 - 5.11 MIL/uL   Hemoglobin 10.3 (L) 12.0 - 15.0 g/dL   HCT 29.5 (L) 62.1 - 30.8 %   MCV 78.2 (L) 80.0 - 100.0 fL   MCH 23.8 (L) 26.0 - 34.0 pg   MCHC 30.5 30.0 - 36.0 g/dL   RDW 65.7 (H) 84.6 - 96.2 %   Platelets 199 150 - 400 K/uL    Comment: SPECIMEN CHECKED FOR CLOTS REPEATED TO VERIFY PLATELET COUNT CONFIRMED BY SMEAR    nRBC 0.0 0.0 - 0.2 %   Neutrophils Relative % 69 %   Neutro Abs 7.6 1.7 - 7.7 K/uL   Lymphocytes Relative 19 %   Lymphs Abs 2.1 0.7 - 4.0 K/uL   Monocytes Relative 9 %   Monocytes Absolute 1.0 0.1 - 1.0 K/uL   Eosinophils Relative 1 %   Eosinophils Absolute 0.1 0.0 - 0.5 K/uL   Basophils Relative 1 %   Basophils Absolute 0.1 0.0 - 0.1 K/uL   Immature Granulocytes 1 %   Abs Immature Granulocytes 0.05 0.00 - 0.07 K/uL   Target Cells PRESENT     Comment: Performed at [facility], [address], [place], [place] [address]  I-Stat beta hCG blood, ED     Status: None   Collection Time: 03/01/23  3:04 PM  Result Value Ref Range   I-stat hCG, quantitative <5.0 <5 mIU/mL   Comment 3            Comment:   GEST. AGE      CONC.  (mIU/mL)   <=1 WEEK        5 - 50     2 WEEKS       50 - 500     3 WEEKS       100 - 10,000     4 WEEKS     1,000 - 30,000        FEMALE  AND NON-PREGNANT FEMALE:     LESS THAN 5 mIU/mL   SARS Coronavirus 2 by RT PCR (hospital order, performed in [facility] hospital lab) *cepheid single result test* Anterior Nasal Swab     Status: None   Collection Time: 03/02/23  1:41 AM   Specimen: Anterior Nasal Swab  Result Value Ref Range   SARS Coronavirus 2 by RT PCR NEGATIVE NEGATIVE    Comment: (NOTE) SARS-CoV-2 target nucleic acids are NOT DETECTED.  The SARS-CoV-2 RNA is generally detectable in upper and lower respiratory specimens during the acute phase of  infection. The lowest concentration of SARS-CoV-2 viral copies this assay can detect is 250 copies / mL. A negative result does not preclude SARS-CoV-2 infection and should not be used as the sole basis for treatment or other patient management decisions.  A negative result may occur with improper specimen collection / handling, submission of specimen other than nasopharyngeal swab, presence of viral mutation(s) within the areas targeted by this assay, and inadequate number of viral copies (<250 copies / mL). A negative result must be combined with clinical observations, patient history, and epidemiological information.  Fact Sheet for Patients:   RoadLapTop.co.za  Fact Sheet for Healthcare Providers: http://kim-miller.com/  This test is not yet approved or  cleared by the [place] FDA and has been authorized for detection and/or diagnosis of SARS-CoV-2 by FDA under an Emergency Use Authorization (EUA).  This EUA will remain in effect (meaning this test can be used) for the duration of the COVID-19 declaration under Section 564(b)(1) of the Act, 21 U.S.C. section 360bbb-3(b)(1), unless the authorization is terminated or revoked sooner.  Performed at [facility], [address] [place], [place] [address]    DG Lumbar Spine Complete  Result Date: 03/01/2023 CLINICAL DATA:  Fall.  Back pain. EXAM: LUMBAR SPINE - COMPLETE 5 VIEW COMPARISON:  None Available. FINDINGS: 5 non rib-bearing lumbar type vertebral bodies are present. Vertebral body heights and alignment normal. Mild facet degenerative changes are noted bilaterally at L4-5 and L5-S1. IMPRESSION: 1. No acute abnormality. 2. Mild facet degenerative changes at L4-5 and L5-S1. Electronically Signed   By: Marin Roberts M.D.   On: 03/01/2023 15:45   CT Head Wo Contrast  Result Date: 03/01/2023 CLINICAL DATA:  Larey Seat.  Hit head. EXAM: CT HEAD WITHOUT CONTRAST  CT CERVICAL SPINE WITHOUT CONTRAST TECHNIQUE: Multidetector CT imaging of the head and cervical spine was performed following the standard protocol without intravenous contrast. Multiplanar CT image reconstructions of the cervical spine were also generated. RADIATION DOSE REDUCTION: This exam was performed according to the departmental dose-optimization program which includes automated exposure control, adjustment of the mA and/or kV according to patient size and/or use of iterative reconstruction technique. COMPARISON:  None Available. FINDINGS: CT HEAD FINDINGS Brain: The ventricles are normal in size and configuration. No extra-axial fluid collections are identified. The gray-white differentiation is maintained. No CT findings for acute hemispheric infarction or intracranial hemorrhage. No mass lesions. The brainstem and cerebellum are normal. Vascular: No hyperdense vessels or obvious aneurysm. Skull: No acute skull fracture.  No bone lesion. Sinuses/Orbits: The paranasal sinuses and mastoid air cells are clear. The globes are intact. Other: No scalp lesions, laceration or hematoma. CT CERVICAL SPINE FINDINGS Motion degraded examination. Alignment: Grossly normal. Skull base and vertebrae: No obvious acute fracture but limited examination. Soft tissues and spinal canal: No gross abnormality. Disc levels:  No significant canal stenosis. Upper chest: No significant findings. Other: No significant findings.  IMPRESSION: 1. No acute intracranial findings or skull fracture. 2. Motion degraded examination of the cervical spine without obvious acute fracture. Electronically Signed   By: Rudie Meyer M.D.   On: 03/01/2023 14:27   CT Cervical Spine Wo Contrast  Result Date: 03/01/2023 CLINICAL DATA:  Larey Seat.  Hit head. EXAM: CT HEAD WITHOUT CONTRAST CT CERVICAL SPINE WITHOUT CONTRAST TECHNIQUE: Multidetector CT imaging of the head and cervical spine was performed following the standard protocol without intravenous  contrast. Multiplanar CT image reconstructions of the cervical spine were also generated. RADIATION DOSE REDUCTION: This exam was performed according to the departmental dose-optimization program which includes automated exposure control, adjustment of the mA and/or kV according to patient size and/or use of iterative reconstruction technique. COMPARISON:  None Available. FINDINGS: CT HEAD FINDINGS Brain: The ventricles are normal in size and configuration. No extra-axial fluid collections are identified. The gray-white differentiation is maintained. No CT findings for acute hemispheric infarction or intracranial hemorrhage. No mass lesions. The brainstem and cerebellum are normal. Vascular: No hyperdense vessels or obvious aneurysm. Skull: No acute skull fracture.  No bone lesion. Sinuses/Orbits: The paranasal sinuses and mastoid air cells are clear. The globes are intact. Other: No scalp lesions, laceration or hematoma. CT CERVICAL SPINE FINDINGS Motion degraded examination. Alignment: Grossly normal. Skull base and vertebrae: No obvious acute fracture but limited examination. Soft tissues and spinal canal: No gross abnormality. Disc levels:  No significant canal stenosis. Upper chest: No significant findings. Other: No significant findings. IMPRESSION: 1. No acute intracranial findings or skull fracture. 2. Motion degraded examination of the cervical spine without obvious acute fracture. Electronically Signed   By: Rudie Meyer M.D.   On: 03/01/2023 14:27   DG Knee Complete 4 Views Left  Result Date: 03/01/2023 CLINICAL DATA:  Fall EXAM: LEFT KNEE - COMPLETE 4 VIEW COMPARISON:  None Available. FINDINGS: No evidence of fracture, dislocation, or joint effusion. No evidence of arthropathy or other focal bone abnormality. Soft tissues are unremarkable. IMPRESSION: No fracture or dislocation. Electronically Signed   By: Lorenza Cambridge M.D.   On: 03/01/2023 13:58    Pending Labs Unresulted Labs (From admission,  onward)     Start     Ordered   03/01/23 1327  Ethanol  Once,   URGENT        03/01/23 1327            Vitals/Pain Today's Vitals   03/01/23 1326 03/01/23 1716 03/01/23 2301 03/02/23 0706  BP: (!) 168/85 134/75 (!) 152/120 (!) 159/112  Pulse: 70 87 94 70  Resp: Temp: (!) 97.5 F (36.4 C)  99.4 F (37.4 C) 98 F (36.7 C)  TempSrc: Oral  Oral Oral  SpO2: 100% 100% 100% 100%  PainSc:  Asleep      Isolation Precautions Airborne and Contact precautions  Medications Medications  ibuprofen (ADVIL) tablet 600 mg (600 mg Oral Given 03/02/23 0917)  alum & mag hydroxide-simeth (MAALOX/MYLANTA) 200-200-20 MG/5ML suspension 30 mL (has no administration in time range)  amLODipine (NORVASC) tablet 5 mg (5 mg Oral Given 03/02/23 0917)  lactated ringers bolus 1,000 mL (0 mLs Intravenous Stopped 03/01/23 1800)  ondansetron (ZOFRAN) injection 4 mg (4 mg Intravenous Given 03/01/23 1633)  pantoprazole (PROTONIX) injection 40 mg (40 mg Intravenous Given 03/01/23 1633)    Mobility walks     Focused Assessments Cardiac Assessment Handoff:    No results found for: "CKTOTAL", "CKMB", "CKMBINDEX", "TROPONINI" No results found for: "DDIMER" Does  the Patient currently have chest pain? No    R Recommendations: See Admitting Provider Note  Report given to:   Additional Notes:

## 2023-03-02 NOTE — Tx Team (Signed)
Initial Treatment Plan 03/02/2023 4:42 PM Marie Atkins [id number]    PATIENT STRESSORS: Marital or family conflict   Substance abuse     PATIENT STRENGTHS: Communication skills    PATIENT IDENTIFIED PROBLEMS: Depression  Anxiety  Paranoia   Hopeless   Depression  Crying spells  Polysubstance abuse.         DISCHARGE CRITERIA:  Ability to meet basic life and health needs Verbal commitment to aftercare and medication compliance  PRELIMINARY DISCHARGE PLAN: Outpatient therapy Return to previous living arrangement  PATIENT/FAMILY INVOLVEMENT: This treatment plan has been presented to and reviewed with the patient, Marie Atkins, has been given the opportunity to ask questions and make suggestions.  Melvenia Needles, RN 03/02/2023, 4:42 PM

## 2023-03-02 NOTE — ED Notes (Signed)
Spoke with Vikki Ports at [facility] and gave her report on patient. She stated that we can send her over now

## 2023-03-02 NOTE — Plan of Care (Signed)

## 2023-03-02 NOTE — BH Assessment (Signed)
Per Edythe Clarity, AC at [facility], Pt has been accepted to bed 403-1 to the service of Dr Sherron Flemings. Bed will be available at 0900. Number for RN report is [phone]. Notified Dr April Nicanor Alcon and Alonna Buckler, RN of acceptance via secure message.   Pamalee Leyden, [facility], [facility] Triage Specialist [phone]

## 2023-03-02 NOTE — ED Provider Notes (Signed)
  Physical Exam  BP (!) 159/112 (BP Location: Right Arm)   Pulse 70   Temp 98 F (36.7 C) (Oral)   Resp 20   SpO2 100%   Physical Exam  Procedures  Procedures  ED Course / MDM    Medical Decision Making Amount and/or Complexity of Data Reviewed Labs: ordered. Radiology: ordered.  Risk OTC drugs. Prescription drug management.   Reportedly pending inpatient treatment at behavioral health at 9 AM this morning.  Dr. Vicente Masson accepting.       Benjiman Core, MD 03/02/23 [phone]

## 2023-03-02 NOTE — Progress Notes (Signed)
   03/02/23 2300  Psych Admission Type (Psych Patients Only)  Admission Status Voluntary  Psychosocial Assessment  Patient Complaints Anxiety  Eye Contact Fair  Facial Expression Animated  Affect Appropriate to circumstance  Speech Logical/coherent  Interaction Assertive  Motor Activity Slow  Appearance/Hygiene In scrubs  Behavior Characteristics Cooperative;Appropriate to situation  Mood Pleasant  Thought Process  Coherency WDL  Content WDL  Delusions None reported or observed  Perception WDL  Hallucination None reported or observed  Judgment Poor  Confusion None  Danger to Self  Current suicidal ideation? Denies  Agreement Not to Harm Self Yes  Description of Agreement verbal  Danger to Others  Danger to Others None reported or observed
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
## 2023-03-03 ENCOUNTER — Encounter (HOSPITAL_COMMUNITY): Payer: Self-pay

## 2023-03-03 DIAGNOSIS — F1020 Alcohol dependence, uncomplicated: Secondary | ICD-10-CM | POA: Diagnosis present

## 2023-03-03 DIAGNOSIS — F1090 Alcohol use, unspecified, uncomplicated: Secondary | ICD-10-CM | POA: Diagnosis present

## 2023-03-03 DIAGNOSIS — F1410 Cocaine abuse, uncomplicated: Secondary | ICD-10-CM | POA: Diagnosis present

## 2023-03-03 DIAGNOSIS — F333 Major depressive disorder, recurrent, severe with psychotic symptoms: Secondary | ICD-10-CM | POA: Diagnosis not present

## 2023-03-03 DIAGNOSIS — F17200 Nicotine dependence, unspecified, uncomplicated: Secondary | ICD-10-CM | POA: Diagnosis present

## 2023-03-03 MED ORDER — SUCRALFATE 1 G PO TABS
1.0000 g | ORAL_TABLET | Freq: Three times a day (TID) | ORAL | Status: DC
  Administered 2023-03-03 – 2023-03-09 (×22): 1 g via ORAL
  Filled 2023-03-03 (×31): qty 1

## 2023-03-03 MED ORDER — CLONIDINE HCL 0.1 MG PO TABS
0.1000 mg | ORAL_TABLET | Freq: Two times a day (BID) | ORAL | Status: DC | PRN
  Administered 2023-03-04 – 2023-03-08 (×2): 0.1 mg via ORAL
  Filled 2023-03-03 (×2): qty 1

## 2023-03-03 MED ORDER — WHITE PETROLATUM EX OINT
TOPICAL_OINTMENT | CUTANEOUS | Status: AC
  Filled 2023-03-03: qty 5

## 2023-03-03 MED ORDER — FLUOXETINE HCL 20 MG PO CAPS
20.0000 mg | ORAL_CAPSULE | Freq: Every day | ORAL | Status: DC
  Administered 2023-03-05 – 2023-03-08 (×4): 20 mg via ORAL
  Filled 2023-03-03 (×6): qty 1

## 2023-03-03 MED ORDER — PANTOPRAZOLE SODIUM 40 MG PO TBEC
40.0000 mg | DELAYED_RELEASE_TABLET | Freq: Every day | ORAL | Status: DC
  Administered 2023-03-04 – 2023-03-09 (×6): 40 mg via ORAL
  Filled 2023-03-03 (×9): qty 1

## 2023-03-03 MED ORDER — FLUOXETINE HCL 10 MG PO CAPS
10.0000 mg | ORAL_CAPSULE | Freq: Every day | ORAL | Status: AC
  Administered 2023-03-04: 10 mg via ORAL
  Filled 2023-03-03: qty 1

## 2023-03-03 MED ORDER — LOSARTAN POTASSIUM 25 MG PO TABS
25.0000 mg | ORAL_TABLET | Freq: Every day | ORAL | Status: DC
  Administered 2023-03-03 – 2023-03-04 (×2): 25 mg via ORAL
  Filled 2023-03-03 (×3): qty 1
  Filled 2023-03-03: qty 0.5
  Filled 2023-03-03: qty 1
  Filled 2023-03-03: qty 0.5
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
## 2023-03-03 NOTE — [facility] Group Notes (Signed)
Pt did not attend AA group  

## 2023-03-03 NOTE — BH IP Treatment Plan (Signed)
Interdisciplinary Treatment and Diagnostic Plan Update  03/03/2023 Time of Session: 10:45am Kashauna Celmer MRN: [id number]  Principal Diagnosis: MDD (major depressive disorder), recurrent, severe, with psychosis  Secondary Diagnoses: Principal Problem:   MDD (major depressive disorder), recurrent, severe, with psychosis   Current Medications:  Current Facility-Administered Medications  Medication Dose Route Frequency Provider Last Rate Last Admin   acetaminophen (TYLENOL) tablet 650 mg  650 mg Oral Q6H PRN Bobbitt, Shalon E, NP       alum & mag hydroxide-simeth (MAALOX/MYLANTA) 200-200-20 MG/5ML suspension 30 mL  30 mL Oral Q4H PRN Bobbitt, Shalon E, NP   30 mL at 03/03/23 0528   amLODipine (NORVASC) tablet 5 mg  5 mg Oral Daily Ajibola, Ene A, NP   5 mg at 03/03/23 0831   diphenhydrAMINE (BENADRYL) capsule 50 mg  50 mg Oral TID PRN Bobbitt, Shalon E, NP       Or   diphenhydrAMINE (BENADRYL) injection 50 mg  50 mg Intramuscular TID PRN Bobbitt, Shalon E, NP       haloperidol (HALDOL) tablet 5 mg  5 mg Oral TID PRN Bobbitt, Shalon E, NP       Or   haloperidol lactate (HALDOL) injection 5 mg  5 mg Intramuscular TID PRN Bobbitt, Shalon E, NP       hydrOXYzine (ATARAX) tablet 25 mg  25 mg Oral Q6H PRN Leevy-Johnson, Brooke A, NP       loperamide (IMODIUM) capsule 2-4 mg  2-4 mg Oral PRN Leevy-Johnson, Brooke A, NP   2 mg at 03/03/23 0831   LORazepam (ATIVAN) tablet 1 mg  1 mg Oral Q6H PRN Leevy-Johnson, Brooke A, NP       magnesium hydroxide (MILK OF MAGNESIA) suspension 30 mL  30 mL Oral Daily PRN Bobbitt, Shalon E, NP       multivitamin with minerals tablet 1 tablet  1 tablet Oral Daily Leevy-Johnson, Brooke A, NP   1 tablet at 03/03/23 0831   nicotine (NICODERM CQ - dosed in mg/24 hours) patch 14 mg  14 mg Transdermal Daily Attiah, Nadir, MD       ondansetron (ZOFRAN-ODT) disintegrating tablet 4 mg  4 mg Oral Q6H PRN Leevy-Johnson, Brooke A, NP       risperiDONE (RISPERDAL M-TABS)  disintegrating tablet 2 mg  2 mg Oral Q8H PRN Bobbitt, Shalon E, NP       And   ziprasidone (GEODON) injection 20 mg  20 mg Intramuscular PRN Bobbitt, Shalon E, NP       thiamine (Vitamin B-1) tablet 100 mg  100 mg Oral Daily Leevy-Johnson, Brooke A, NP   100 mg at 03/03/23 0830   traZODone (DESYREL) tablet 50 mg  50 mg Oral QHS PRN Bobbitt, Shalon E, NP   50 mg at 03/02/23 2050   PTA Medications: Medications Prior to Admission  Medication Sig Dispense Refill Last Dose   amLODipine (NORVASC) 5 MG tablet Take 5 mg by mouth daily.   Past Week   dicyclomine (BENTYL) 20 MG tablet Take 1 tablet (20 mg total) by mouth every 6 (six) hours as needed for spasms. 30 tablet 1 Past Week   ferrous sulfate 325 (65 FE) MG EC tablet Take 325 mg by mouth daily with breakfast.   Past Week   metroNIDAZOLE (METROGEL) 0.75 % vaginal gel Place 1 Applicatorful vaginally at bedtime.   Past Month   Multiple Vitamins-Minerals (MULTIVITAMIN WITH MINERALS) tablet Take 1 tablet by mouth daily.   Past Week   sucralfate (  CARAFATE) 1 g tablet Take 1 tablet (1 g total) by mouth 4 (four) times daily -  with meals and at bedtime. Dissolve 1 tablet in 30 mL of water and drink 4 times daily. 120 tablet 0 Past Week   alum & mag hydroxide-simeth (MYLANTA MAXIMUM STRENGTH) 400-400-40 MG/5ML suspension Take 15 mLs by mouth every 6 (six) hours as needed for indigestion. (Patient not taking: Reported on 03/02/2023) 355 mL 0    pantoprazole (PROTONIX) 40 MG tablet Take 1 tablet (40 mg total) by mouth daily before breakfast for 14 days. 14 tablet 0     Patient Stressors: Marital or family conflict   Substance abuse    Patient Strengths: Communication skills   Treatment Modalities: Medication Management, Group therapy, Case management,  1 to 1 session with clinician, Psychoeducation, Recreational therapy.   Physician Treatment Plan for Primary Diagnosis: MDD (major depressive disorder), recurrent, severe, with psychosis Long Term  Goal(s):     Short Term Goals:    Medication Management: Evaluate patient's response, side effects, and tolerance of medication regimen.  Therapeutic Interventions: 1 to 1 sessions, Unit Group sessions and Medication administration.  Evaluation of Outcomes: Progressing  Physician Treatment Plan for Secondary Diagnosis: Principal Problem:   MDD (major depressive disorder), recurrent, severe, with psychosis  Long Term Goal(s):     Short Term Goals:       Medication Management: Evaluate patient's response, side effects, and tolerance of medication regimen.  Therapeutic Interventions: 1 to 1 sessions, Unit Group sessions and Medication administration.  Evaluation of Outcomes: Progressing   RN Treatment Plan for Primary Diagnosis: MDD (major depressive disorder), recurrent, severe, with psychosis Long Term Goal(s): Knowledge of disease and therapeutic regimen to maintain health will improve  Short Term Goals: Ability to remain free from injury will improve, Ability to verbalize frustration and anger appropriately will improve, Ability to demonstrate self-control, Ability to participate in decision making will improve, Ability to verbalize feelings will improve, Ability to disclose and discuss suicidal ideas, Ability to identify and develop effective coping behaviors will improve, and Compliance with prescribed medications will improve  Medication Management: RN will administer medications as ordered by provider, will assess and evaluate patient's response and provide education to patient for prescribed medication. RN will report any adverse and/or side effects to prescribing provider.  Therapeutic Interventions: 1 on 1 counseling sessions, Psychoeducation, Medication administration, Evaluate responses to treatment, Monitor vital signs and CBGs as ordered, Perform/monitor CIWA, COWS, AIMS and Fall Risk screenings as ordered, Perform wound care treatments as ordered.  Evaluation of  Outcomes: Progressing   LCSW Treatment Plan for Primary Diagnosis: MDD (major depressive disorder), recurrent, severe, with psychosis Long Term Goal(s): Safe transition to appropriate next level of care at discharge, Engage patient in therapeutic group addressing interpersonal concerns.  Short Term Goals: Engage patient in aftercare planning with referrals and resources, Increase social support, Increase ability to appropriately verbalize feelings, Increase emotional regulation, Facilitate acceptance of mental health diagnosis and concerns, Facilitate patient progression through stages of change regarding substance use diagnoses and concerns, Identify triggers associated with mental health/substance abuse issues, and Increase skills for wellness and recovery  Therapeutic Interventions: Assess for all discharge needs, 1 to 1 time with Social worker, Explore available resources and support systems, Assess for adequacy in community support network, Educate family and significant other(s) on suicide prevention, Complete Psychosocial Assessment, Interpersonal group therapy.  Evaluation of Outcomes: Progressing   Progress in Treatment: Attending groups: No. Participating in groups: No. Taking medication as  prescribed: Yes. Toleration medication: No. Patient states that she has had diarrhea and dizziness Family/Significant other contact made: No, will contact:  [name] ( sisters) Patient understands diagnosis: Yes. Discussing patient identified problems/goals with staff: Yes. Medical problems stabilized or resolved: Yes. Denies suicidal/homicidal ideation: Yes. Issues/concerns per patient self-inventory: No.   New problem(s) identified: No, Describe:  none reported   New Short Term/Long Term Goal(s):   medication stabilization, elimination of SI thoughts, development of comprehensive mental wellness plan.    Patient Goals:  Pt states, "I want to stop being afraid of  people"  Discharge Plan or Barriers: Patient recently admitted. CSW will continue to follow and assess for appropriate referrals and possible discharge planning.    Reason for Continuation of Hospitalization: Anxiety Depression Medication stabilization Withdrawal symptoms Other; describe paranoia  Estimated Length of Stay: 5-7 days  Last 3 [place] Suicide Severity Risk Score: Flowsheet Row [facility] (Current) [facility] from 03[facility]01/2023 in [facility] from [facility]/20[facility] in [facility]  C-SSRS RISK CATEGORY High Risk High Risk Low Risk       Last [facility] 2/9 Scores:    06/16/2020    4:08 PM 06/15/2020    9:56 AM  Depression screen PHQ 2/9  Decreased Interest 2 1  Down, Depressed, Hopeless 2 1  PHQ - 2 Score 4 2  Altered sleeping 2 1  Tired, decreased energy 2 1  Change in appetite 2 1  Feeling bad or failure about yourself  2 2  Trouble concentrating 2 1  Moving slowly or fidgety/restless 2 1  Suicidal thoughts 1 0  PHQ-9 Score 17 9  Difficult doing work/chores Somewhat difficult Somewhat difficult    Scribe for Treatment Team: Beatris Si, LCSW 03/03/2023 3:10 PM

## 2023-03-03 NOTE — [facility] Suicide Risk Assessment (Signed)
[facility] INPATIENT:  Family/Significant Other Suicide Prevention Education  Suicide Prevention Education:  Education Completed; Marie Atkins ( sisters) [phone],  (name of family member/significant other) has been identified by the patient as the family member/significant other with whom the patient will be residing, and identified as the person(s) who will aid the patient in the event of a mental health crisis (suicidal ideations/suicide attempt).  With written consent from the patient, the family member/significant other has been provided the following suicide prevention education, prior to the and/or following the discharge of the patient.  CSW spoke with patient sister and completed safety planning. Sister states that patient has a constant drug use problem and issues of her always disappearing to where they do not know where she is days, weeks, and sometimes months at a time. Sister states that the family is always concerned and hopes the hospital can get patient into a residential. Furthermore, sister states that she found a place for patient to go to in [place] but patient never showed up for sister to take her , so sister is interested in Door to Door treatment for patient. Also, sister confirmed that patient does not own or have access to any guns or weapons.   The suicide prevention education provided includes the following: Suicide risk factors Suicide prevention and interventions National Suicide Hotline telephone number [facility] assessment telephone number [facility] Emergency Assistance 911 [facility] and/or Residential Mobile Crisis Unit telephone number  Request made of family/significant other to: Remove weapons (e.g., guns, rifles, knives), all items previously/currently identified as safety concern.   Remove drugs/medications (over-the-counter, prescriptions, illicit drugs), all items previously/currently identified as a safety concern.  The family  member/significant other verbalizes understanding of the suicide prevention education information provided.  The family member/significant other agrees to remove the items of safety concern listed above.  Marie Atkins 03/03/2023, 3:44 PM

## 2023-03-03 NOTE — Progress Notes (Signed)
   03/03/23 0800  Psych Admission Type (Psych Patients Only)  Admission Status Voluntary  Psychosocial Assessment  Patient Complaints Anxiety  Eye Contact Fair  Facial Expression Animated  Affect Appropriate to circumstance  Speech Logical/coherent  Interaction Assertive  Motor Activity Slow  Appearance/Hygiene Unremarkable  Behavior Characteristics Cooperative;Appropriate to situation  Mood Pleasant  Thought Process  Coherency WDL  Content WDL  Delusions WDL  Perception WDL  Hallucination None reported or observed  Judgment Poor  Confusion None  Danger to Self  Current suicidal ideation? Denies  Agreement Not to Harm Self Yes  Description of Agreement Patient agreed to safety plan.  Danger to Others  Danger to Others None reported or observed

## 2023-03-03 NOTE — Plan of Care (Signed)
  Problem: Education: Goal: Knowledge of [facility] Education information/materials will improve Outcome: Progressing Goal: Emotional status will improve Outcome: Progressing Goal: Mental status will improve Outcome: Progressing Goal: Verbalization of understanding the information provided will improve Outcome: Progressing   Problem: Activity: Goal: Interest or engagement in activities will improve Outcome: Progressing Goal: Sleeping patterns will improve Outcome: Progressing   Problem: Coping: Goal: Ability to verbalize frustrations and anger appropriately will improve Outcome: Progressing Goal: Ability to demonstrate self-control will improve Outcome: Progressing   

## 2023-03-03 NOTE — [facility] Suicide Risk Assessment (Signed)
Suicide Risk Assessment  Admission Assessment    [facility] Admission Suicide Risk Assessment   Nursing information obtained from:  Patient Demographic factors:  Unemployed Current Mental Status:  NA Loss Factors:  NA Historical Factors:  NA Risk Reduction Factors:  NA  Total Time spent with patient: 1.5 hours Principal Problem: MDD (major depressive disorder), recurrent, severe, with psychosis Diagnosis:  Principal Problem:   MDD (major depressive disorder), recurrent, severe, with psychosis Active Problems:   Alcohol use disorder   Cocaine use disorder   Tobacco use disorder  Subjective Data: "I just got tired of myself and of the things that I was doing and I tried to kill myself."   Continued Clinical Symptoms: "worsening insomnia, trouble with her concentration levels, anhedonia, anxiety, decreased energy levels, feelings of guilt about her substance use and not being in touch with her family, decreased appetite, psychomotor retardation, auditory hallucinations, as well as feelings of irritability, hopelessness, worthlessness & helplessness" Recent attempt to overdose on cocaine and superficial cuts made to left wrist along with current mental status & her substance use place pt at a moderate risk of suicide. She continues to need inpatient hospitalization for the treatment and stabilization of her mental status.   Alcohol Use Disorder Identification Test Final Score (AUDIT): 34 The "Alcohol Use Disorders Identification Test", Guidelines for Use in Primary Care, Second Edition.  World Science writer [facility]). Score between 0-7:  no or low risk or alcohol related problems. Score between 8-15:  moderate risk of alcohol related problems. Score between 16-19:  high risk of alcohol related problems. Score 20 or above:  warrants further diagnostic evaluation for alcohol dependence and treatment.  CLINICAL FACTORS:   Depression:    Anhedonia Hopelessness Insomnia Severe Alcohol/Substance Abuse/Dependencies More than one psychiatric diagnosis Previous Psychiatric Diagnoses and Treatments  Musculoskeletal: Strength & Muscle Tone: within normal limits Gait & Station: normal Patient leans: N/A  Psychiatric Specialty Exam:  Presentation  General Appearance:  Fairly Groomed  Eye Contact: Fair  Speech: Clear and Coherent  Speech Volume: Normal  Handedness: Right   Mood and Affect  Mood: Depressed; Anxious  Affect: Congruent   Thought Process  Thought Processes: Coherent  Descriptions of Associations:Intact  Orientation:Full (Time, Place and Person)  Thought Content:Logical  History of Schizophrenia/Schizoaffective disorder:No  Duration of Psychotic Symptoms:Greater than six months  Hallucinations:Hallucinations: Auditory  Ideas of Reference:Paranoia  Suicidal Thoughts:Suicidal Thoughts: No  Homicidal Thoughts:Homicidal Thoughts: No   Sensorium  Memory: Immediate Good  Judgment: Fair  Insight: Fair   Art therapist  Concentration: Poor  Attention Span: Poor  Recall: [employer] of Knowledge: Fair  Language: Fair   Psychomotor Activity  Psychomotor Activity: Psychomotor Activity: Normal   Assets  Assets: Resilience  Sleep  Sleep: Sleep: Poor  Physical Exam: Physical Exam Review of Systems  Constitutional:  Negative for fever.  HENT:  Negative for hearing loss.   Eyes:  Negative for blurred vision.  Respiratory:  Negative for cough.   Cardiovascular:  Negative for chest pain.  Gastrointestinal:  Negative for heartburn.  Genitourinary:  Negative for dysuria.  Musculoskeletal:  Negative for myalgias.  Skin:  Negative for rash.  Neurological:  Negative for dizziness.  Psychiatric/Behavioral:  Positive for depression, hallucinations and substance abuse. Negative for memory loss and suicidal ideas. The patient is nervous/anxious and has  insomnia.    Blood pressure (!) 142/84, pulse 90, temperature 98.4 F (36.9 C), temperature source Oral, resp. rate 17, height  (1.626 m), weight 68.8 kg,  SpO2 100 %. Body mass index is 26.02 kg/m.  COGNITIVE FEATURES THAT CONTRIBUTE TO RISK:  None    SUICIDE RISK:   Moderate: Currently denies SI, but remains at a moderate risk as explained above.  PLAN OF CARE: See H & P  I certify that inpatient services furnished can reasonably be expected to improve the patient's condition.   Starleen Blue, NP 03/03/2023, 6:16 PM

## 2023-03-03 NOTE — Group Note (Signed)
Recreation Therapy Group Note   Group Topic:Team Building  Group Date: 03/03/2023 Start Time: 1610 End Time: 1016 Facilitators: Lucille Witts-McCall, LRT,CTRS Location: [address] Dayroom   Goal Area(s) Addresses:  Patient will effectively work with peer towards shared goal.  Patient will identify skills used to make activity successful.  Patient will identify how skills used during activity can be used to reach post d/c goals.    Group Description: Landing Pad. In teams of 3-5, patients were given 12 plastic drinking straws and an equal length of masking tape. Using the materials provided, patients were asked to build a landing pad to catch a golf ball dropped from approximately 5 feet in the air. All materials were required to be used by the team in their design. LRT facilitated post-activity discussion.   Affect/Mood: N/A   Participation Level: Did not attend    Clinical Observations/Individualized Feedback:     Plan: Continue to engage patient in RT group sessions 2-3x/week.   Lamiya Naas-McCall, LRT,CTRS 03/03/2023 1:13 PM
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
## 2023-03-04 DIAGNOSIS — F333 Major depressive disorder, recurrent, severe with psychotic symptoms: Secondary | ICD-10-CM | POA: Diagnosis not present

## 2023-03-04 MED ORDER — RISPERIDONE 1 MG PO TABS
1.0000 mg | ORAL_TABLET | Freq: Every day | ORAL | Status: DC
  Filled 2023-03-04: qty 1

## 2023-03-04 MED ORDER — GABAPENTIN 100 MG PO CAPS
200.0000 mg | ORAL_CAPSULE | Freq: Three times a day (TID) | ORAL | Status: DC
  Administered 2023-03-04 – 2023-03-06 (×7): 200 mg via ORAL
  Filled 2023-03-04 (×14): qty 2

## 2023-03-04 MED ORDER — RISPERIDONE 1 MG PO TABS
1.0000 mg | ORAL_TABLET | Freq: Two times a day (BID) | ORAL | Status: DC
  Administered 2023-03-04 – 2023-03-09 (×10): 1 mg via ORAL
  Filled 2023-03-04 (×14): qty 1

## 2023-03-04 MED ORDER — HYDROCERIN EX CREA
TOPICAL_CREAM | Freq: Every day | CUTANEOUS | Status: DC
  Filled 2023-03-04 (×2): qty 113

## 2023-03-04 MED ORDER — TRAZODONE HCL 100 MG PO TABS
100.0000 mg | ORAL_TABLET | Freq: Every evening | ORAL | Status: DC | PRN
  Administered 2023-03-04: 100 mg via ORAL
  Filled 2023-03-04: qty 1

## 2023-03-04 MED ORDER — LOSARTAN POTASSIUM 50 MG PO TABS
50.0000 mg | ORAL_TABLET | Freq: Every day | ORAL | Status: DC
  Administered 2023-03-05 – 2023-03-09 (×5): 50 mg via ORAL
  Filled 2023-03-04 (×7): qty 1

## 2023-03-04 NOTE — Progress Notes (Signed)
[facility] MD Progress Note  03/04/2023 3:36 PM Marie Atkins  MRN:  [id number] Principal Problem: MDD (major depressive disorder), recurrent, severe, with psychosis Diagnosis: Principal Problem:   MDD (major depressive disorder), recurrent, severe, with psychosis Active Problems:   Alcohol use disorder   Cocaine use disorder   Tobacco use disorder  Reason For Admission: Marie Atkins is a 52 yo AAF with prior mental health diagnoses of cocaine use d/o & mdd who was transported to the [facility] by [facility] after bystanders found her down and called 911. While at the ER, pt reported suicidal ideations, stated that she had used a razor to cut her left wrist & used cocaine prior to being found. She reported worsening depressive symptoms in the context of multiple psychosocial stressors, and was transferred voluntarily to this [facility] for treatment and stabilization of her mental status.   24 hr chart review: Sleep Hours last night: not documented, but pt reports poor sleep quality last night. Nursing Concerns: none reported Behavioral episodes in the past 24 hrs: none reported Medication Compliance: Compliant  Vital Signs in the past 24 hrs: Slightly elevated in the 130s to 140s PRN Medications in the past 24 hrs: Trazodone 50 mg & Hydroxyzine 25 mg   Patient assessment note: Pt presents today with a depressed mood and affect is congruent. Her attention to personal hygiene and grooming is fair, eye contact is good, speech is clear & coherent. Thought contents are organized and logical, and pt currently denies SI/HI/AVH. She presents with some paranoia, states that she was unable to sit in the day room earlier because she felt as though people were looking at her. She also reports having a lot of anxiety. She rates depression as 6, 10 being worst, and rates anxiety same.   Patient reports a poor sleep quality last night, states that she was unable to sleep. She reports a good appetite, asking for Leafy Kindle,  states she took this medication in the past and it was helpful with her psychosis and mood stabilization. We will start Vraylar 1.5 mg for the paranoia and increasing Trazodone to 100 mg nightly for sleep. Also starting Gabapentin 100 mg TID for anxiety. Continuing other medications as listed below. Pt's assigned CSW is working on sending referrals to rehab facilities where pt can for for substance abuse rehab.   Total Time spent with patient: 45 minutes  Past Psychiatric History: See H &P  Past Medical History:  Past Medical History:  Diagnosis Date   Cocaine abuse    Depression    HTN (hypertension)    Iron deficiency anemia     Past Surgical History:  Procedure Laterality Date   TUBAL LIGATION     Family History:  Family History  Problem Relation Age of Onset   Hypertension Mother    Diabetes Mother    CVA Mother    Heart attack Mother    Colon cancer Maternal Uncle    Family Psychiatric  History: See H & P Social History:  Social History   Substance and Sexual Activity  Alcohol Use Yes   Comment: "sometimes every other day," "1 to 2 cans of beer or a glass or two of wine"     Social History   Substance and Sexual Activity  Drug Use Yes   Types: Cocaine   Comment: 3 to 4 times a week, last use on Thursday (06/22/2020)    Social History   Socioeconomic History   Marital status: Single  Spouse name: Not on file   Number of children: Not on file   Years of education: Not on file   Highest education level: Not on file  Occupational History   Not on file  Tobacco Use   Smoking status: Every Day    Types: Cigarettes   Smokeless tobacco: Never   Tobacco comments:    10 cigarettes/day  Vaping Use   Vaping Use: Never used  Substance and Sexual Activity   Alcohol use: Yes    Comment: "sometimes every other day," "1 to 2 cans of beer or a glass or two of wine"   Drug use: Yes    Types: Cocaine    Comment: 3 to 4 times a week, last use on Thursday  (06/22/2020)   Sexual activity: Not Currently  Other Topics Concern   Not on file  Social History Narrative   Not on file   Social Determinants of Health   Financial Resource Strain: Not on file  Food Insecurity: No Food Insecurity (03/02/2023)   Hunger Vital Sign    Worried About Running Out of Food in the Last Year: Never true    Ran Out of Food in the Last Year: Never true  Transportation Needs: No Transportation Needs (03/02/2023)   PRAPARE - Administrator, Civil Service (Medical): No    Lack of Transportation (Non-Medical): No  Physical Activity: Not on file  Stress: Not on file  Social Connections: Not on file   Sleep: Poor  Appetite:  Good  Current Medications: Current Facility-Administered Medications  Medication Dose Route Frequency Provider Last Rate Last Admin   acetaminophen (TYLENOL) tablet 650 mg  650 mg Oral Q6H PRN Bobbitt, Shalon E, NP   650 mg at 03/04/23 0944   alum & mag hydroxide-simeth (MAALOX/MYLANTA) 200-200-20 MG/5ML suspension 30 mL  30 mL Oral Q4H PRN Bobbitt, Shalon E, NP   30 mL at 03/03/23 1610   cloNIDine (CATAPRES) tablet 0.1 mg  0.1 mg Oral BID PRN Starleen Blue, NP       diphenhydrAMINE (BENADRYL) capsule 50 mg  50 mg Oral TID PRN Bobbitt, Shalon E, NP       Or   diphenhydrAMINE (BENADRYL) injection 50 mg  50 mg Intramuscular TID PRN Bobbitt, Franchot Mimes, NP       [START ON 03/05/2023] FLUoxetine (PROZAC) capsule 20 mg  20 mg Oral Daily Thessaly Mccullers, NP       gabapentin (NEURONTIN) capsule 200 mg  200 mg Oral TID Amous Crewe, NP       haloperidol (HALDOL) tablet 5 mg  5 mg Oral TID PRN Bobbitt, Shalon E, NP       Or   haloperidol lactate (HALDOL) injection 5 mg  5 mg Intramuscular TID PRN Bobbitt, Shalon E, NP       hydrocerin (EUCERIN) cream   Topical Daily Edmund Holcomb, NP       hydrOXYzine (ATARAX) tablet 25 mg  25 mg Oral Q6H PRN Leevy-Johnson, Brooke A, NP   25 mg at 03/04/23 0943   loperamide (IMODIUM) capsule 2-4 mg  2-4  mg Oral PRN Leevy-Johnson, Brooke A, NP   2 mg at 03/03/23 0831   LORazepam (ATIVAN) tablet 1 mg  1 mg Oral Q6H PRN Leevy-Johnson, Brooke A, NP       [START ON 03/05/2023] losartan (COZAAR) tablet 50 mg  50 mg Oral Daily Rudransh Bellanca, NP       magnesium hydroxide (MILK OF MAGNESIA) suspension 30 mL  30 mL Oral Daily PRN Bobbitt, Shalon E, NP       multivitamin with minerals tablet 1 tablet  1 tablet Oral Daily Leevy-Johnson, Brooke A, NP   1 tablet at 03/04/23 4098   nicotine (NICODERM CQ - dosed in mg/24 hours) patch 14 mg  14 mg Transdermal Daily Attiah, Nadir, MD       ondansetron (ZOFRAN-ODT) disintegrating tablet 4 mg  4 mg Oral Q6H PRN Leevy-Johnson, Brooke A, NP       pantoprazole (PROTONIX) EC tablet 40 mg  40 mg Oral QAC breakfast Starleen Blue, NP   40 mg at 03/04/23 1191   risperiDONE (RISPERDAL M-TABS) disintegrating tablet 2 mg  2 mg Oral Q8H PRN Bobbitt, Shalon E, NP       And   ziprasidone (GEODON) injection 20 mg  20 mg Intramuscular PRN Bobbitt, Shalon E, NP       risperiDONE (RISPERDAL) tablet 1 mg  1 mg Oral BID Dominiqua Cooner, NP       risperiDONE (RISPERDAL) tablet 1 mg  1 mg Oral QHS Wanona Stare, NP       sucralfate (CARAFATE) tablet 1 g  1 g Oral TID WC & HS Climmie Cronce, NP   1 g at 03/04/23 1207   thiamine (Vitamin B-1) tablet 100 mg  100 mg Oral Daily Leevy-Johnson, Brooke A, NP   100 mg at 03/04/23 4782   traZODone (DESYREL) tablet 100 mg  100 mg Oral QHS PRN Starleen Blue, NP        Lab Results: No results found for this or any previous visit (from the past 48 hour(s)).  Blood Alcohol level:  Lab Results  Component Value Date   ETH <10 06/23/2020   ETH <10 06/15/2020    Metabolic Disorder Labs: Lab Results  Component Value Date   HGBA1C 5.1 06/24/2020   MPG 100 06/24/2020   No results found for: "PROLACTIN" Lab Results  Component Value Date   CHOL 166 06/24/2020   TRIG 87 06/24/2020   HDL 69 06/24/2020   CHOLHDL 2.4 06/24/2020   VLDL 17  06/24/2020   LDLCALC 80 06/24/2020    Physical Findings: AIMS:  , ,  ,  ,    CIWA:  CIWA-Ar Total: 2 COWS:     Musculoskeletal: Strength & Muscle Tone: within normal limits Gait & Station: normal Patient leans: N/A  Psychiatric Specialty Exam:  Presentation  General Appearance:  Appropriate for Environment; Fairly Groomed  Eye Contact: Fair  Speech: Clear and Coherent  Speech Volume: Normal  Handedness: Right   Mood and Affect  Mood: Anxious; Depressed  Affect: Congruent   Thought Process  Thought Processes: Coherent  Descriptions of Associations:Intact  Orientation:Full (Time, Place and Person)  Thought Content:Logical  History of Schizophrenia/Schizoaffective disorder:No  Duration of Psychotic Symptoms:Greater than six months  Hallucinations:Hallucinations: Auditory  Ideas of Reference:Paranoia  Suicidal Thoughts:Suicidal Thoughts: No  Homicidal Thoughts:Homicidal Thoughts: No   Sensorium  Memory: Immediate Good  Judgment: Fair  Insight: Fair   Chartered certified accountant: Fair  Attention Span: Fair  Recall: Fair  Fund of Knowledge: Fair  Language: Fair   Psychomotor Activity  Psychomotor Activity: Psychomotor Activity: Normal   Assets  Assets: Manufacturing systems engineer; Resilience   Sleep  Sleep: Sleep: Poor    Physical Exam: Physical Exam Constitutional:      Appearance: Normal appearance.  HENT:     Head: Normocephalic.     Nose: Nose normal.  Eyes:     Pupils:  Pupils are equal, round, and reactive to light.  Pulmonary:     Effort: Pulmonary effort is normal.  Musculoskeletal:        General: Normal range of motion.     Cervical back: Normal range of motion.  Neurological:     Mental Status: She is alert and oriented to person, place, and time.  Psychiatric:        Thought Content: Thought content normal.    Review of Systems  Constitutional:  Negative for fever.  HENT:  Negative  for hearing loss.   Eyes:  Negative for blurred vision.  Respiratory:  Negative for cough.   Cardiovascular:  Negative for chest pain.  Gastrointestinal:  Negative for heartburn.  Genitourinary:  Negative for dysuria.  Musculoskeletal:  Negative for myalgias.  Skin:  Negative for rash.  Neurological:  Negative for dizziness.  Psychiatric/Behavioral:  Positive for depression and substance abuse. Negative for hallucinations, memory loss and suicidal ideas. The patient is nervous/anxious and has insomnia.    Blood pressure (!) 132/90, pulse 97, temperature 98.2 F (36.8 C), temperature source Oral, resp. rate 14, height  (1.626 m), weight 68.8 kg, SpO2 100 %. Body mass index is 26.02 kg/m.   Treatment Plan Summary: Treatment Plan Summary: Daily contact with patient to assess and evaluate symptoms and progress in treatment and Medication management   Observation Level/Precautions:  15 minute checks  Laboratory:  Labs reviewed   Psychotherapy:  Unit Group sessions  Medications:  See [facility]  Consultations:  To be determined   Discharge Concerns:  Safety, medication compliance, mood stability  Estimated LOS: 5-7 days  Other:  N/A    PLAN Safety and Monitoring: Voluntary admission to inpatient psychiatric unit for safety, stabilization and treatment Daily contact with patient to assess and evaluate symptoms and progress in treatment Patient's case to be discussed in multi-disciplinary team meeting Observation Level : q15 minute checks Vital signs: q12 hours Precautions: Safety   Long Term Goal(s): Improvement in symptoms so as ready for discharge   Short Term Goals: Ability to identify changes in lifestyle to reduce recurrence of condition will improve, Ability to verbalize feelings will improve, Ability to disclose and discuss suicidal ideas, Ability to demonstrate self-control will improve, Ability to identify and develop effective coping behaviors will improve, Ability to  maintain clinical measurements within normal limits will improve, Compliance with prescribed medications will improve, and Ability to identify triggers associated with substance abuse/mental health issues will improve   Diagnoses Principal Problem:   MDD (major depressive disorder), recurrent, severe, with psychosis Active Problems:   Alcohol use disorder   Cocaine use disorder   Tobacco use disorder   MDD with psychosis features -Start Risperdal 1 mg BID for psychosis/mood stabilization -Continue Prozac 10 mg on 4/23 & increase to 20 mg on 4/24   GERD -Continue Sucralfate 1 mg QID (home med) -Continue Protonix 40 mg (home med)  Insomnia -Increase Trazodone to 100 mg nightly PRN    Hypertension -Increase Losartan to 50 mg daily  -Continue Clonidine for SBP>160 or DBP>100   Anxiety/CIWA < or =10 -Start Gabapentin 100 mg TID  -Continue Hydroxyzine 25 mg every 6 hours PRN   CIWA > 10 -Continue Ativan  tabs every 6 hours PRN   Other PRNS -Continue Agitation Protocol medications-See MAR for complete order (Risperidone & Geodon) -Continue Tylenol 650 mg every 6 hours PRN for mild pain -Continue Maalox 30 mg every 4 hrs PRN for indigestion -Continue Imodium 2-4 mg as needed  for diarrhea -Continue Milk of Magnesia as needed every 6 hrs for constipation -Continue Zofran disintegrating tabs every 6 hrs PRN for nausea    Discharge Planning: Social work and case management to assist with discharge planning and identification of hospital follow-up needs prior to discharge Estimated LOS: 5-7 days Discharge Concerns: Need to establish a safety plan; Medication compliance and effectiveness Discharge Goals: Return home with outpatient referrals for mental health follow-up including medication management/psychotherapy   I certify that inpatient services furnished can reasonably be expected to improve the patient's condition.    Starleen Blue, NP 03/04/2023, 3:36 PM

## 2023-03-04 NOTE — Progress Notes (Signed)
   03/04/23 0945  Psych Admission Type (Psych Patients Only)  Admission Status Voluntary  Psychosocial Assessment  Patient Complaints Anxiety;Depression  Eye Contact Suspiciousness  Facial Expression Animated  Affect Angry;Anxious;Preoccupied  Speech Logical/coherent  Interaction Assertive  Motor Activity Slow  Appearance/Hygiene Unremarkable  Behavior Characteristics Anxious  Mood Depressed;Anxious  Thought Process  Coherency Disorganized  Content Paranoia  Delusions Paranoid  Perception WDL  Hallucination None reported or observed  Judgment Poor  Confusion WDL  Danger to Self  Current suicidal ideation? Denies  Danger to Others  Danger to Others None reported or observed
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
## 2023-03-04 NOTE — Plan of Care (Signed)
  Problem: Coping: Goal: Ability to verbalize frustrations and anger appropriately will improve Outcome: Progressing   Problem: Safety: Goal: Periods of time without injury will increase Outcome: Progressing   

## 2023-03-04 NOTE — Group Note (Signed)
LCSW Group Therapy Note   Group Date: 03/04/2023 Start Time: 1100 End Time: 1200   Type of Therapy and Topic:  Group Therapy: Goals Exploration   Participation Level:  Active    Description of Group: In this process group, patients discussed using strengths to work toward goals and address challenges.  Patients identified two positive things about themselves and one goal they were working on.  Patients were given the opportunity to share openly and support each other's plan for self-empowerment.  The group discussed the value of gratitude and were encouraged to have a daily reflection of positive characteristics or circumstances.  Patients were encouraged to identify a plan to utilize their strengths to work on current challenges and goals.   Therapeutic Goals Patient will verbalize personal strengths/positive qualities and relate how these can assist with achieving desired personal goals Patients will verbalize affirmation of peers plans for personal change and goal setting Patients will explore the value of gratitude and positive focus as related to successful achievement of goals Patients will verbalize a plan for regular reinforcement of personal positive qualities and circumstances.   Summary of Patient Progress: Patient identified the definition of goals. Patients was given the opportunity to share openly and support other group members' who discussed their goals. Patient did follow along with worksheet and shared the goals she was going to set once she is discharged such as getting a job. Patient was respectful during group of her peers and the facilitators. Patient remain in group the entire time.        Therapeutic Modalities Cognitive Behavioral Therapy Motivational Interviewing     Beather Arbour 03/04/2023  3:18 PM

## 2023-03-04 NOTE — Group Note (Signed)
Date:  03/04/2023 Time:  4:04 PM  Group Topic/Focus:  Dimensions of Wellness:   The focus of this group is to introduce the topic of wellness and discuss the role each dimension of wellness plays in total health.    Participation Level:  Active  Participation Quality:  Appropriate  Affect:  Blunted  Cognitive:  Appropriate  Insight: Appropriate  Engagement in Group:  Improving  Modes of Intervention:  Education  Additional Comments:     Reymundo Poll 03/04/2023, 4:04 PM

## 2023-03-04 NOTE — Progress Notes (Signed)
RN provided pt with medication handouts for prozac, gabapentin and risperdal per pt request.

## 2023-03-04 NOTE — [facility] Group Notes (Signed)
Adult Psychoeducational Group Note  Date:  03/04/2023 Time:  9:28 PM  Group Topic/Focus:  Wrap-Up Group:   The focus of this group is to help patients review their daily goal of treatment and discuss progress on daily workbooks.  Participation Level:  Active  Participation Quality:  Appropriate  Affect:  Appropriate  Cognitive:  Appropriate  Insight: Appropriate  Engagement in Group:  Engaged  Modes of Intervention:  Discussion and Support  Additional Comments:  Pt attended the evening group and responded to all discussion prompts from the Writer. Pt shared that today was a good day on the unit, the highlight of which was a supportive visit from her sister. On the subject of staying well upon discharge, Tyna mentioned wanting to get back into her church community. "I also just need to try thinking more positively." Pt rated her day a 5 out of 10.  Christ Kick 03/04/2023, 9:28 PM

## 2023-03-04 NOTE — Group Note (Signed)
Recreation Therapy Group Note   Group Topic:Animal Assisted Therapy   Group Date: 03/04/2023 Start Time: 1610 End Time: 1030 Facilitators: Icis Budreau-McCall, LRT,CTRS Location: [address] Day[address]   Animal-Assisted Activity (AAA) Program Checklist/Progress Notes Patient Eligibility Criteria Checklist & Daily Group note for Rec Tx Intervention  AAA/T Program Assumption of Risk Form signed by Patient/ or Parent Legal Guardian Yes  Patient is free of allergies or severe asthma Yes  Patient reports no fear of animals Yes  Patient reports no history of cruelty to animals Yes  Patient understands his/her participation is voluntary Yes  Patient washes hands before animal contact Yes  Patient washes hands after animal contact Yes   Affect/Mood: Appropriate   Participation Level: Engaged   Participation Quality: Independent   Behavior: Appropriate   Speech/Thought Process: Focused    Clinical Observations/Individualized Feedback:     Plan: Continue to engage patient in RT group sessions 2-3x/week.   Marie Atkins, LRT,CTRS 03/04/2023 1:28 PM

## 2023-03-04 NOTE — Progress Notes (Addendum)
   03/03/23 2100  Psych Admission Type (Psych Patients Only)  Admission Status Voluntary  Psychosocial Assessment  Patient Complaints Anxiety;Depression  Eye Contact Fair  Facial Expression Flat  Affect Appropriate to circumstance  Speech Logical/coherent  Interaction Assertive;Minimal  Motor Activity Slow  Appearance/Hygiene Unremarkable  Behavior Characteristics Cooperative  Mood Depressed;Anxious  Thought Process  Coherency WDL  Content WDL  Delusions None reported or observed  Perception WDL  Hallucination None reported or observed  Judgment Poor  Confusion None  Danger to Self  Current suicidal ideation? Denies  Agreement Not to Harm Self Yes  Description of Agreement Verbal contract for safety  Danger to Others  Danger to Others None reported or observed   Pt reported 8/10 anxiety and 10/10 depression. Pt was offered support and encouragement. Pt was given scheduled medications and PRN Trazodone and Atarax per MAR. Q 15 minute checks were done for safety. Pt did not attend group. Pt has no complaints. Pt receptive to treatment and safety maintained on unit.

## 2023-03-04 NOTE — Progress Notes (Signed)
Atkins, Marie A, Chaplain  Marie Atkins, Chaplain Spiritual care group on Spiritual Resources highlighting hope today facilitated by Chaplain Katy Atkins, Bcc and Chaplain Zendaya Groseclose  Group Goal: Support / Education around spiritual resources - Hope  Members engage in facilitated group support and psycho-social education.  Group Description:  Following introductions and group rules, group members engaged in facilitated group dialogue and support around topic the topic of hope. There is particular support around what brings them hope and what makes them feel hopeless. Group identified types of hope and identified patterns, circumstances, and changes that precipitate being hopeless. Group reflected on thoughts / feelings around hope, normalized hopeless responses, and recognized a variety in hope experiences. Group encouraged individual reflection on what brings them hope and drew inspiration from pictures that represent hope to each of them.   Patient Progress: Patient did not attend group.   Marie Atkins Chaplain 

## 2023-03-05 DIAGNOSIS — F333 Major depressive disorder, recurrent, severe with psychotic symptoms: Secondary | ICD-10-CM | POA: Diagnosis not present

## 2023-03-05 LAB — VITAMIN D 25 HYDROXY (VIT D DEFICIENCY, FRACTURES): Vit D, 25-Hydroxy: 10.38 ng/mL — ABNORMAL LOW (ref 30–100)

## 2023-03-05 LAB — LIPID PANEL
Cholesterol: 186 mg/dL (ref 0–200)
HDL: 72 mg/dL (ref >40)
LDL Cholesterol: 98 mg/dL (ref 0–99)
Total CHOL/HDL Ratio: 2.6 RATIO
Triglycerides: 79 mg/dL (ref <150)
VLDL: 16 mg/dL (ref 0–40)

## 2023-03-05 LAB — TSH: TSH: 7.741 u[IU]/mL — ABNORMAL HIGH (ref 0.350–4.500)

## 2023-03-05 MED ORDER — VITAMIN D (ERGOCALCIFEROL) 1.25 MG (50000 UNIT) PO CAPS
50000.0000 [IU] | ORAL_CAPSULE | ORAL | Status: DC
  Administered 2023-03-05: 50000 [IU] via ORAL
  Filled 2023-03-05 (×2): qty 1

## 2023-03-05 MED ORDER — TRAZODONE HCL 50 MG PO TABS: 50.0000 mg | ORAL_TABLET | Freq: Every evening | ORAL | Status: DC | PRN

## 2023-03-05 MED ORDER — SALINE SPRAY 0.65 % NA SOLN
1.0000 | NASAL | Status: DC | PRN
  Administered 2023-03-05 – 2023-03-07 (×3): 1 via NASAL
  Filled 2023-03-05: qty 44

## 2023-03-05 NOTE — [facility] Group Notes (Signed)
Spiritual care group on grief and loss facilitated by Chaplain Katy Tehya Leath, Bcc  Group Goal: Support / Education around grief and loss  Members engage in facilitated group support and psycho-social education.  Group Description:  Following introductions and group rules, group members engaged in facilitated group dialogue and support around topic of loss, with particular support around experiences of loss in their lives. Group Identified types of loss (relationships / self / things) and identified patterns, circumstances, and changes that precipitate losses. Reflected on thoughts / feelings around loss, normalized grief responses, and recognized variety in grief experience. Group encouraged individual reflection on safe space and on the coping skills that they are already utilizing.  Group drew on Adlerian / Rogerian and narrative framework  Patient Progress: Did not attend.  

## 2023-03-05 NOTE — Progress Notes (Signed)
   03/04/23 2130  Psych Admission Type (Psych Patients Only)  Admission Status Voluntary  Psychosocial Assessment  Patient Complaints Anxiety;Depression  Eye Contact Fair  Facial Expression Flat  Affect Anxious;Preoccupied  Speech Logical/coherent  Interaction Assertive  Motor Activity Slow  Appearance/Hygiene Unremarkable  Behavior Characteristics Cooperative;Anxious  Mood Depressed;Anxious  Thought Process  Coherency Disorganized  Content Paranoia  Delusions Paranoid  Perception WDL  Hallucination Auditory  Judgment Poor  Confusion None  Danger to Self  Current suicidal ideation? Denies  Agreement Not to Harm Self Yes  Description of Agreement Verbal contract for safety  Danger to Others  Danger to Others None reported or observed   Pt reports 7/10 anxiety. Pt was offered support and encouragement. Pt was given scheduled medications and PRN Trazodone & Atarax per MAR.  Pt attends groups and interacts well with peers and staff. Q 15 minute checks were done for safety. Pt has no complaints. Pt receptive to treatment and safety maintained on unit.

## 2023-03-05 NOTE — Group Note (Signed)
Date:  03/05/2023 Time:  9:54 AM  Group Topic/Focus:  Goals Group:   The focus of this group is to help patients establish daily goals to achieve during treatment and discuss how the patient can incorporate goal setting into their daily lives to aide in recovery. Orientation:   The focus of this group is to educate the patient on the purpose and policies of crisis stabilization and provide a format to answer questions about their admission.  The group details unit policies and expectations of patients while admitted.    Participation Level:  Did Not Attend   Additional Comments:  Patient was informed multiple times that group was starting but did not attend.   Karie Skowron T Jacole Capley 03/05/2023, 9:54 AM  

## 2023-03-05 NOTE — Progress Notes (Signed)
Chaplain met with Marie Atkins to provide support.  Her anxiety was high and she was tearful as she shared that she didn't know what to do. Chaplain provided active listening as she shared about her current relationship with a man who is controlling and who has contributed to her relapse.  Chaplain provided encouragement to care for self and not go back into an unhealthy relationship.  Chaplain provided prayer and calming presence.  [address] Bcc Pager, [phone]

## 2023-03-05 NOTE — Plan of Care (Signed)
  Problem: Activity: Goal: Interest or engagement in activities will improve Outcome: Progressing   Problem: Safety: Goal: Periods of time without injury will increase Outcome: Progressing   

## 2023-03-05 NOTE — Progress Notes (Signed)
[facility] MD Progress Note  03/05/2023 4:45 PM Marie Atkins  MRN:  [id number] Principal Problem: MDD (major depressive disorder), recurrent, severe, with psychosis Diagnosis: Principal Problem:   MDD (major depressive disorder), recurrent, severe, with psychosis Active Problems:   Alcohol use disorder   Cocaine use disorder   Tobacco use disorder  Reason For Admission: Marie Atkins is a 52 yo AAF with prior mental health diagnoses of cocaine use d/o & mdd who was transported to the [facility] by [facility] after bystanders found her down and called 911. While at the ER, pt reported suicidal ideations, stated that she had used a razor to cut her left wrist & used cocaine prior to being found. She reported worsening depressive symptoms in the context of multiple psychosocial stressors, and was transferred voluntarily to this [facility] for treatment and stabilization of her mental status.   24 hr chart review: Sleep Hours last night: Not documented, but pt reports a good night's sleep Nursing Concerns: None reported Behavioral episodes in the past 24 hrs: none  Medication Compliance: compliant  Vital Signs in the past 24 hrs: HR with some mild elevations today PRN Medications in the past 24 hrs: Trazodone 100 mg last night   Patient assessment note: Pt presents flat affect and depressed mood, attention to personal hygiene and grooming is fair, eye contact is good, speech is clear & coherent. Thought contents are organized and logical, and pt currently denies SI/HI/AVH or paranoia. There is no evidence of delusional thoughts.  Patient was drowsy earlier today morning, appeared sedated. She reported a good sleep quality last night, reported a good appetite, denies being in any physical pain. Denies any other concerns. CSW reports that pt has been declined into two rehab facilities [facility] & [facility]). CSW continuing to coordinate and send referrals to other facilities.   We will reduce Trazodone to 50  mg nightly PRN due to morning drowsiness and continue other medications as listed below. We will continue to monitor on a daily basis. Psychosis seems to be resolving, and plan is to discharge during the weekend if pt has a place to go to. Alternative housing discussed with CSW is a domestic violence shelter, and CSW will continue to work with pt to locate these resources.  Labs Reviewed: Ordered Vitamin D 50.000 units weekly for vitamin D deficiency. Ordered Free T3 & Free T4 for Elevated TSH level.  Total Time spent with patient: 45 minutes  Past Psychiatric History: See H & P  Past Medical History:  Past Medical History:  Diagnosis Date   Cocaine abuse    Depression    HTN (hypertension)    Iron deficiency anemia     Past Surgical History:  Procedure Laterality Date   TUBAL LIGATION     Family History:  Family History  Problem Relation Age of Onset   Hypertension Mother    Diabetes Mother    CVA Mother    Heart attack Mother    Colon cancer Maternal Uncle    Family Psychiatric  History: See H & P Social History:  Social History   Substance and Sexual Activity  Alcohol Use Yes   Comment: "sometimes every other day," "1 to 2 cans of beer or a glass or two of wine"     Social History   Substance and Sexual Activity  Drug Use Yes   Types: Cocaine   Comment: 3 to 4 times a week, last use on Thursday (06/22/2020)    Social History  Socioeconomic History   Marital status: Single    Spouse name: Not on file   Number of children: Not on file   Years of education: Not on file   Highest education level: Not on file  Occupational History   Not on file  Tobacco Use   Smoking status: Every Day    Types: Cigarettes   Smokeless tobacco: Never   Tobacco comments:    10 cigarettes/day  Vaping Use   Vaping Use: Never used  Substance and Sexual Activity   Alcohol use: Yes    Comment: "sometimes every other day," "1 to 2 cans of beer or a glass or two of wine"   Drug  use: Yes    Types: Cocaine    Comment: 3 to 4 times a week, last use on Thursday (06/22/2020)   Sexual activity: Not Currently  Other Topics Concern   Not on file  Social History Narrative   Not on file   Social Determinants of Health   Financial Resource Strain: Not on file  Food Insecurity: No Food Insecurity (03/02/2023)   Hunger Vital Sign    Worried About Running Out of Food in the Last Year: Never true    Ran Out of Food in the Last Year: Never true  Transportation Needs: No Transportation Needs (03/02/2023)   PRAPARE - Administrator, Civil Service (Medical): No    Lack of Transportation (Non-Medical): No  Physical Activity: Not on file  Stress: Not on file  Social Connections: Not on file   Sleep: Good  Appetite:  Good  Current Medications: Current Facility-Administered Medications  Medication Dose Route Frequency Provider Last Rate Last Admin   acetaminophen (TYLENOL) tablet 650 mg  650 mg Oral Q6H PRN Bobbitt, Shalon E, NP   650 mg at 03/04/23 0944   alum & mag hydroxide-simeth (MAALOX/MYLANTA) 200-200-20 MG/5ML suspension 30 mL  30 mL Oral Q4H PRN Bobbitt, Shalon E, NP   30 mL at 03/03/23 9147   cloNIDine (CATAPRES) tablet 0.1 mg  0.1 mg Oral BID PRN Starleen Blue, NP   0.1 mg at 03/04/23 1705   diphenhydrAMINE (BENADRYL) capsule 50 mg  50 mg Oral TID PRN Bobbitt, Shalon E, NP       Or   diphenhydrAMINE (BENADRYL) injection 50 mg  50 mg Intramuscular TID PRN Bobbitt, Shalon E, NP       FLUoxetine (PROZAC) capsule 20 mg  20 mg Oral Daily Marabella Popiel, NP   20 mg at 03/05/23 0945   gabapentin (NEURONTIN) capsule 200 mg  200 mg Oral TID Starleen Blue, NP   200 mg at 03/05/23 1348   haloperidol (HALDOL) tablet 5 mg  5 mg Oral TID PRN Bobbitt, Shalon E, NP       Or   haloperidol lactate (HALDOL) injection 5 mg  5 mg Intramuscular TID PRN Bobbitt, Franchot Mimes, NP       hydrocerin (EUCERIN) cream   Topical Daily Starleen Blue, NP   Given at 03/05/23 0946    losartan (COZAAR) tablet 50 mg  50 mg Oral Daily Denton Derks, Tyler Aas, NP   50 mg at 03/05/23 0945   magnesium hydroxide (MILK OF MAGNESIA) suspension 30 mL  30 mL Oral Daily PRN Bobbitt, Shalon E, NP       multivitamin with minerals tablet 1 tablet  1 tablet Oral Daily Leevy-Johnson, Brooke A, NP   1 tablet at 03/05/23 0945   nicotine (NICODERM CQ - dosed in mg/24 hours) patch 14  mg  14 mg Transdermal Daily Attiah, Nadir, MD       pantoprazole (PROTONIX) EC tablet 40 mg  40 mg Oral QAC breakfast Starleen Blue, NP   40 mg at 03/05/23 1610   risperiDONE (RISPERDAL M-TABS) disintegrating tablet 2 mg  2 mg Oral Q8H PRN Bobbitt, Shalon E, NP       And   ziprasidone (GEODON) injection 20 mg  20 mg Intramuscular PRN Bobbitt, Shalon E, NP       risperiDONE (RISPERDAL) tablet 1 mg  1 mg Oral BID Starleen Blue, NP   1 mg at 03/05/23 0945   sodium chloride (OCEAN) 0.65 % nasal spray 1 spray  1 spray Each Nare PRN Starleen Blue, NP       sucralfate (CARAFATE) tablet 1 g  1 g Oral TID WC & HS Harlem Bula, NP   1 g at 03/05/23 1348   thiamine (Vitamin B-1) tablet 100 mg  100 mg Oral Daily Leevy-Johnson, Brooke A, NP   100 mg at 03/05/23 0945   traZODone (DESYREL) tablet 50 mg  50 mg Oral QHS PRN Starleen Blue, NP       Vitamin D (Ergocalciferol) (DRISDOL) 1.25 MG (50000 UNIT) capsule 50,000 Units  50,000 Units Oral Q7 days Starleen Blue, NP        Lab Results:  Results for orders placed or performed during the hospital encounter of 03/02/23 (from the past 48 hour(s))  Lipid panel     Status: None   Collection Time: 03/05/23  6:32 AM  Result Value Ref Range   Cholesterol 186 0 - 200 mg/dL   Triglycerides 79 <960 mg/dL   HDL 72 >45 mg/dL   Total CHOL/HDL Ratio 2.6 RATIO   VLDL 16 0 - 40 mg/dL   LDL Cholesterol 98 0 - 99 mg/dL    Comment:        Total Cholesterol/HDL:CHD Risk Coronary Heart Disease Risk Table                     Men   Women  1/2 Average Risk   3.4   3.3  Average Risk       5.0    4.4  2 X Average Risk   9.6   7.1  3 X Average Risk  23.4   11.0        Use the calculated Patient Ratio above and the CHD Risk Table to determine the patient's CHD Risk.        ATP III CLASSIFICATION (LDL):  <100     mg/dL   Optimal  409-811  mg/dL   Near or Above                    Optimal  130-159  mg/dL   Borderline  914-782  mg/dL   High  >956     mg/dL   Very High Performed at [facility], [address], [place], [place] [address]   VITAMIN D 25 Hydroxy (Vit-D Deficiency, Fractures)     Status: Abnormal   Collection Time: 03/05/23  6:32 AM  Result Value Ref Range   Vit D, 25-Hydroxy 10.38 (L) 30 - 100 ng/mL    Comment: (NOTE) Vitamin D deficiency has been defined by the Institute of Medicine  and an Endocrine Society practice guideline as a level of serum 25-OH  vitamin D less than 20 ng/mL (1,2). The Endocrine Society went on to  further define vitamin D  insufficiency as a level between 21 and 29  ng/mL (2).  1. IOM (Institute of Medicine). 2010. Dietary reference intakes for  calcium and D. [place]: [employer]. 2. Holick MF, Binkley [place], Bischoff-Ferrari HA, et al. Evaluation,  treatment, and prevention of vitamin D deficiency: an Endocrine  Society clinical practice guideline, JCEM. 2011 Jul; 96(7): 1911-30.  Performed at [facility], [address] [place], [place] [address]   TSH     Status: Abnormal   Collection Time: 03/05/23  6:32 AM  Result Value Ref Range   TSH 7.741 (H) 0.350 - 4.500 uIU/mL    Comment: Performed by a 3rd Generation assay with a functional sensitivity of <=0.01 uIU/mL. Performed at [facility], [address], [place], [place] [address]     Blood Alcohol level:  Lab Results  Component Value Date   ETH <10 06/23/2020   ETH <10 06/15/2020    Metabolic Disorder Labs: Lab Results  Component Value Date   HGBA1C 5.1 06/24/2020   MPG 100 06/24/2020   No results  found for: "PROLACTIN" Lab Results  Component Value Date   CHOL 186 03/05/2023   TRIG 79 03/05/2023   HDL 72 03/05/2023   CHOLHDL 2.6 03/05/2023   VLDL 16 03/05/2023   LDLCALC 98 03/05/2023   LDLCALC 80 06/24/2020    Physical Findings: AIMS:  , ,  ,  ,    CIWA:  CIWA-Ar Total: 3 COWS:     Musculoskeletal: Strength & Muscle Tone: within normal limits Gait & Station: normal Patient leans: N/A  Psychiatric Specialty Exam:  Presentation  General Appearance:  Casual; Fairly Groomed  Eye Contact: Fair  Speech: Clear and Coherent  Speech Volume: Normal  Handedness: Right   Mood and Affect  Mood: Depressed; Anxious  Affect: Congruent   Thought Process  Thought Processes: Coherent  Descriptions of Associations:Intact  Orientation:Full (Time, Place and Person)  Thought Content:Logical  History of Schizophrenia/Schizoaffective disorder:No  Duration of Psychotic Symptoms:Greater than six months  Hallucinations:Hallucinations: None  Ideas of Reference:None  Suicidal Thoughts:Suicidal Thoughts: No  Homicidal Thoughts:Homicidal Thoughts: No   Sensorium  Memory: Immediate Good  Judgment: Fair  Insight: Fair   Art therapist  Concentration: Fair  Attention Span: Fair  Recall: [employer] of Knowledge: Fair  Language: Fair   Psychomotor Activity  Psychomotor Activity: Psychomotor Activity: Normal   Assets  Assets: Resilience   Sleep  Sleep: Sleep: Good    Physical Exam: Physical Exam Constitutional:      Appearance: Normal appearance.  HENT:     Head: Normocephalic.  Eyes:     Pupils: Pupils are equal, round, and reactive to light.  Musculoskeletal:        General: Normal range of motion.     Cervical back: Normal range of motion.  Neurological:     Mental Status: She is alert and oriented to person, place, and time.    Review of Systems  Constitutional:  Negative for fever.  HENT:  Negative for  hearing loss.   Eyes:  Negative for blurred vision.  Respiratory:  Negative for cough.   Cardiovascular:  Negative for chest pain.  Gastrointestinal:  Negative for heartburn.  Genitourinary:  Negative for dysuria.  Musculoskeletal:  Negative for myalgias.  Neurological:  Negative for dizziness.  Psychiatric/Behavioral:  Positive for depression, hallucinations and substance abuse. Negative for memory loss and suicidal ideas. The patient is nervous/anxious and has insomnia.    Blood pressure Marland Kitchen)  146/87, pulse 71, temperature 98.7 F (37.1 C), resp. rate 16, height 5\' 4"  (1.626 m), weight 68.8 kg, SpO2 100 %. Body mass index is 26.02 kg/m.   Treatment Plan Summary: Treatment Plan Summary: Daily contact with patient to assess and evaluate symptoms and progress in treatment and Medication management   Observation Level/Precautions:  15 minute checks  Laboratory:  Labs reviewed   Psychotherapy:  Unit Group sessions  Medications:  See [facility]  Consultations:  To be determined   Discharge Concerns:  Safety, medication compliance, mood stability  Estimated LOS: 5-7 days  Other:  N/A    PLAN Safety and Monitoring: Voluntary admission to inpatient psychiatric unit for safety, stabilization and treatment Daily contact with patient to assess and evaluate symptoms and progress in treatment Patient's case to be discussed in multi-disciplinary team meeting Observation Level : q15 minute checks Vital signs: q12 hours Precautions: Safety   Long Term Goal(s): Improvement in symptoms so as ready for discharge   Short Term Goals: Ability to identify changes in lifestyle to reduce recurrence of condition will improve, Ability to verbalize feelings will improve, Ability to disclose and discuss suicidal ideas, Ability to demonstrate self-control will improve, Ability to identify and develop effective coping behaviors will improve, Ability to maintain clinical measurements within normal limits will  improve, Compliance with prescribed medications will improve, and Ability to identify triggers associated with substance abuse/mental health issues will improve   Diagnoses Principal Problem:   MDD (major depressive disorder), recurrent, severe, with psychosis Active Problems:   Alcohol use disorder   Cocaine use disorder   Tobacco use disorder   MDD with Psychotic features -Continue Risperdal 1 mg BID for psychosis  -Continue Prozac 20 mg   GERD -Continue Sucralfate 1 mg QID (home med) -Continue  Protonix 40 mg (home med)   Hypertension -Increase Losartan 50 mg daily  -Continue Clonidine for SBP>160 or DBP>100   Anxiety/CIWA < or =10 -Continue Hydroxyzine 25 mg every 6 hours PRN   CIWA > 10 -Continue Ativan 1mg  tabs every 6 hours PRN   Other PRNS -Continue Agitation Protocol medications-See MAR for complete order (Risperidone & Geodon) -Continue Tylenol 650 mg every 6 hours PRN for mild pain -Continue Maalox 30 mg every 4 hrs PRN for indigestion -Continue Imodium 2-4 mg as needed for diarrhea -Continue Milk of Magnesia as needed every 6 hrs for constipation -Continue Zofran disintegrating tabs every 6 hrs PRN for nausea    Discharge Planning: Social work and case management to assist with discharge planning and identification of hospital follow-up needs prior to discharge Estimated LOS: 5-7 days Discharge Concerns: Need to establish a safety plan; Medication compliance and effectiveness Discharge Goals: Return home with outpatient referrals for mental health follow-up including medication management/psychotherapy   I certify that inpatient services furnished can reasonably be expected to improve the patient's condition.    Starleen Blue, NP 03/05/2023, 4:45 PM

## 2023-03-05 NOTE — Progress Notes (Signed)
   03/05/23 2110  Psych Admission Type (Psych Patients Only)  Admission Status Voluntary  Psychosocial Assessment  Patient Complaints Depression;Worrying  Eye Contact Fair  Facial Expression Flat  Affect Preoccupied  Speech Logical/coherent  Interaction Assertive  Motor Activity Slow  Appearance/Hygiene Unremarkable  Behavior Characteristics Cooperative;Calm  Mood Depressed;Pleasant  Thought Process  Coherency Disorganized  Content Preoccupation  Delusions None reported or observed  Perception WDL  Hallucination None reported or observed  Judgment Impaired  Confusion None  Danger to Self  Current suicidal ideation? Denies  Agreement Not to Harm Self Yes  Description of Agreement Verbal contract for safety  Danger to Others  Danger to Others None reported or observed   Pt reported 3/10 anxiety and 8/10 depression. Pt was offered support and encouragement. Pt was given scheduled medications and PRN Milk of Magnesium and Sodium Chloride nasal spray per MAR. Pt attended group. Pt active in the milieu, interacting well with peers and staff. Q 15 minute checks were done for safety. Pt has no complaints. Pt receptive to treatment and safety maintained on unit.

## 2023-03-05 NOTE — Progress Notes (Signed)
Patient denies SI/HI/AVH, depression. Patient endorses sensitivity to sound and some anxiety. Patient does not present with paranoia this morning. Patient has been somnolent throughout the day, not awakening for lunch or medications. Patient remains safe with Q 15 minute safety checks  03/05/23 0850  Psych Admission Type (Psych Patients Only)  Admission Status Voluntary  Psychosocial Assessment  Patient Complaints Anergic;Tension  Eye Contact Fair  Facial Expression Flat  Affect Anxious;Preoccupied  Speech Logical/coherent  Interaction Assertive  Motor Activity Slow  Appearance/Hygiene Unremarkable  Behavior Characteristics Cooperative;Anxious  Mood Depressed;Anxious  Thought Process  Coherency Disorganized  Content Preoccupation  Delusions Paranoid  Perception WDL  Hallucination None reported or observed  Judgment Impaired  Confusion None  Danger to Self  Current suicidal ideation? Denies  Agreement Not to Harm Self Yes  Description of Agreement verbal  Danger to Others  Danger to Others None reported or observed

## 2023-03-05 NOTE — Group Note (Signed)
Recreation Therapy Group Note   Group Topic:Health and Wellness  Group Date: 03/05/2023 Start Time: 0930 End Time: 0955 Facilitators: Yuriy Cui-McCall, LRT,CTRS Location: [address] Dayroom   Goal Area(s) Addresses:  Patient will define components of whole wellness. Patient will verbalize benefit of whole wellness.   Group Description:  Exercise.  LRT and patients discussed the importance on physical exercise and its affects on the body.  LRT then explained to patients they would take turns leading the group in exercises of their choosing.  Patients were told to consider the physical abilities of peers and to anything that would be too challenging.  Patients could take breaks and get water as needed.     Affect/Mood: N/A   Participation Level: Did not attend    Clinical Observations/Individualized Feedback:      Plan: Continue to engage patient in RT group sessions 2-3x/week.   Briceson Broadwater-McCall, LRT,CTRS 03/05/2023 1:38 PM

## 2023-03-05 NOTE — Plan of Care (Signed)
  Problem: Activity: Goal: Interest or engagement in activities will improve Outcome: Progressing   

## 2023-03-06 DIAGNOSIS — F333 Major depressive disorder, recurrent, severe with psychotic symptoms: Secondary | ICD-10-CM | POA: Diagnosis not present

## 2023-03-06 LAB — T4, FREE: Free T4: 0.59 ng/dL — ABNORMAL LOW (ref 0.61–1.12)

## 2023-03-06 LAB — HEMOGLOBIN A1C
Hgb A1c MFr Bld: 4.8 % (ref 4.8–5.6)
Mean Plasma Glucose: 91 mg/dL

## 2023-03-06 MED ORDER — GABAPENTIN 300 MG PO CAPS
300.0000 mg | ORAL_CAPSULE | Freq: Three times a day (TID) | ORAL | Status: DC
  Administered 2023-03-06 – 2023-03-07 (×2): 300 mg via ORAL
  Filled 2023-03-06 (×8): qty 1

## 2023-03-06 MED ORDER — TRAZODONE HCL 100 MG PO TABS
100.0000 mg | ORAL_TABLET | Freq: Every day | ORAL | Status: DC
  Administered 2023-03-06: 100 mg via ORAL
  Filled 2023-03-06 (×4): qty 1

## 2023-03-06 MED ORDER — LEVOTHYROXINE SODIUM 50 MCG PO TABS
50.0000 ug | ORAL_TABLET | Freq: Every day | ORAL | Status: DC
  Administered 2023-03-07 – 2023-03-09 (×3): 50 ug via ORAL
  Filled 2023-03-06 (×3): qty 1
  Filled 2023-03-06: qty 2
  Filled 2023-03-06 (×2): qty 1

## 2023-03-06 MED ORDER — SIMETHICONE 80 MG PO CHEW
160.0000 mg | CHEWABLE_TABLET | Freq: Four times a day (QID) | ORAL | Status: DC | PRN
  Administered 2023-03-06 – 2023-03-07 (×2): 160 mg via ORAL
  Filled 2023-03-06 (×2): qty 2

## 2023-03-06 NOTE — Progress Notes (Signed)
[facility] MD Progress Note  03/06/2023 8:52 AM Marie Atkins  MRN:  [id number]  Principal Problem: MDD (major depressive disorder), recurrent, severe, with psychosis  Diagnosis: Principal Problem:   MDD (major depressive disorder), recurrent, severe, with psychosis Active Problems:   Alcohol use disorder   Cocaine use disorder   Tobacco use disorder  Reason For Admission: Marie Atkins is a 52 yo AAF with prior mental health diagnoses of cocaine use d/o & mdd who was transported to the [facility] by [facility] after bystanders found her down and called 911. While at the ER, pt reported suicidal ideations, stated that she had used a razor to cut her left wrist & used cocaine prior to being found. She reported worsening depressive symptoms in the context of multiple psychosocial stressors, and was transferred voluntarily to this [facility] for treatment and stabilization of her mental status.   Daily notes: Marie Atkins is seen. Chart reviewed. The chart findings discussed with the treatment team. She presents alert, oriented, aware of situation. She is visible on the unit, attending group sessions. She reports, "I feel a little better today than I did yesterday. This is the first time I have been able to be out of my room. I attended group sessions this morning. And because I don't like to be around a lot of people, my anxiety is super high today. I did not sleep well last night. I keep waking up. The vistaril I'm getting for anxiety is not strong enough. I need the dose titrated a little bit. I also will need to know when you guys are expecting to discharge me. Not that I have any place to go to because I'm homeless. Drug addiction has caused me everything". Patient is encouraged to continue to be out of her room & attend group sessions. Her gabapentin is increased to 300 mg po tid for anxiety. Her trazodone is increased from 50 mg to 100 mg scheduled until Bm is regulated. May change Trazodone to prn by discharge.  Reviewed her current lab result. Her free T4 is low at 0.59. has called primary consult evaluation/recommendation. Awaiting call back. Will continue current plan care as already in progress. Patient denies any SIHI, AVH..   Labs Reviewed: Ordered Vitamin D 50.000 units weekly for vitamin D deficiency. Ordered Free T3 & Free T4 for Elevated TSH level.  Total Time spent with patient:  35 minutes  Past Psychiatric History: See H&P  Past Medical History:  Past Medical History:  Diagnosis Date   Cocaine abuse    Depression    HTN (hypertension)    Iron deficiency anemia     Past Surgical History:  Procedure Laterality Date   TUBAL LIGATION     Family History:  Family History  Problem Relation Age of Onset   Hypertension Mother    Diabetes Mother    CVA Mother    Heart attack Mother    Colon cancer Maternal Uncle    Family Psychiatric  History: See H&P Social History:  Social History   Substance and Sexual Activity  Alcohol Use Yes   Comment: "sometimes every other day," "1 to 2 cans of beer or a glass or two of wine"     Social History   Substance and Sexual Activity  Drug Use Yes   Types: Cocaine   Comment: 3 to 4 times a week, last use on Thursday (06/22/2020)    Social History   Socioeconomic History   Marital status: Single  Spouse name: Not on file   Number of children: Not on file   Years of education: Not on file   Highest education level: Not on file  Occupational History   Not on file  Tobacco Use   Smoking status: Every Day    Types: Cigarettes   Smokeless tobacco: Never   Tobacco comments:    10 cigarettes/day  Vaping Use   Vaping Use: Never used  Substance and Sexual Activity   Alcohol use: Yes    Comment: "sometimes every other day," "1 to 2 cans of beer or a glass or two of wine"   Drug use: Yes    Types: Cocaine    Comment: 3 to 4 times a week, last use on Thursday (06/22/2020)   Sexual activity: Not Currently  Other Topics Concern    Not on file  Social History Narrative   Not on file   Social Determinants of Health   Financial Resource Strain: Not on file  Food Insecurity: No Food Insecurity (03/02/2023)   Hunger Vital Sign    Worried About Running Out of Food in the Last Year: Never true    Ran Out of Food in the Last Year: Never true  Transportation Needs: No Transportation Needs (03/02/2023)   PRAPARE - Administrator, Civil Service (Medical): No    Lack of Transportation (Non-Medical): No  Physical Activity: Not on file  Stress: Not on file  Social Connections: Not on file   Sleep: Good  Appetite:  Good  Current Medications: Current Facility-Administered Medications  Medication Dose Route Frequency Provider Last Rate Last Admin   acetaminophen (TYLENOL) tablet 650 mg  650 mg Oral Q6H PRN Bobbitt, Shalon E, NP   650 mg at 03/04/23 0944   alum & mag hydroxide-simeth (MAALOX/MYLANTA) 200-200-20 MG/5ML suspension 30 mL  30 mL Oral Q4H PRN Bobbitt, Shalon E, NP   30 mL at 03/03/23 1610   cloNIDine (CATAPRES) tablet 0.1 mg  0.1 mg Oral BID PRN Starleen Blue, NP   0.1 mg at 03/04/23 1705   diphenhydrAMINE (BENADRYL) capsule 50 mg  50 mg Oral TID PRN Bobbitt, Shalon E, NP       Or   diphenhydrAMINE (BENADRYL) injection 50 mg  50 mg Intramuscular TID PRN Bobbitt, Shalon E, NP       FLUoxetine (PROZAC) capsule 20 mg  20 mg Oral Daily Nkwenti, Doris, NP   20 mg at 03/05/23 0945   gabapentin (NEURONTIN) capsule 200 mg  200 mg Oral TID Starleen Blue, NP   200 mg at 03/05/23 2117   haloperidol (HALDOL) tablet 5 mg  5 mg Oral TID PRN Bobbitt, Shalon E, NP       Or   haloperidol lactate (HALDOL) injection 5 mg  5 mg Intramuscular TID PRN Bobbitt, Franchot Mimes, NP       hydrocerin (EUCERIN) cream   Topical Daily Starleen Blue, NP   Given at 03/05/23 0946   losartan (COZAAR) tablet 50 mg  50 mg Oral Daily Nkwenti, Doris, NP   50 mg at 03/05/23 0945   magnesium hydroxide (MILK OF MAGNESIA) suspension 30 mL  30 mL  Oral Daily PRN Bobbitt, Shalon E, NP   30 mL at 03/05/23 2119   multivitamin with minerals tablet 1 tablet  1 tablet Oral Daily Leevy-Johnson, Brooke A, NP   1 tablet at 03/05/23 0945   nicotine (NICODERM CQ - dosed in mg/24 hours) patch 14 mg  14 mg Transdermal Daily Attiah,  Nadir, MD       pantoprazole (PROTONIX) EC tablet 40 mg  40 mg Oral QAC breakfast Starleen Blue, NP   40 mg at 03/06/23 4098   risperiDONE (RISPERDAL M-TABS) disintegrating tablet 2 mg  2 mg Oral Q8H PRN Bobbitt, Shalon E, NP       And   ziprasidone (GEODON) injection 20 mg  20 mg Intramuscular PRN Bobbitt, Shalon E, NP       risperiDONE (RISPERDAL) tablet 1 mg  1 mg Oral BID Starleen Blue, NP   1 mg at 03/05/23 2117   sodium chloride (OCEAN) 0.65 % nasal spray 1 spray  1 spray Each Nare PRN Starleen Blue, NP   1 spray at 03/05/23 2119   sucralfate (CARAFATE) tablet 1 g  1 g Oral TID WC & HS Starleen Blue, NP   1 g at 03/05/23 2117   thiamine (Vitamin B-1) tablet 100 mg  100 mg Oral Daily Leevy-Johnson, Brooke A, NP   100 mg at 03/05/23 0945   traZODone (DESYREL) tablet 50 mg  50 mg Oral QHS PRN Starleen Blue, NP       Vitamin D (Ergocalciferol) (DRISDOL) 1.25 MG (50000 UNIT) capsule 50,000 Units  50,000 Units Oral Q7 days Starleen Blue, NP   50,000 Units at 03/05/23 1814    Lab Results:  Results for orders placed or performed during the hospital encounter of 03/02/23 (from the past 48 hour(s))  Lipid panel     Status: None   Collection Time: 03/05/23  6:32 AM  Result Value Ref Range   Cholesterol 186 0 - 200 mg/dL   Triglycerides 79 <119 mg/dL   HDL 72 >14 mg/dL   Total CHOL/HDL Ratio 2.6 RATIO   VLDL 16 0 - 40 mg/dL   LDL Cholesterol 98 0 - 99 mg/dL    Comment:        Total Cholesterol/HDL:CHD Risk Coronary Heart Disease Risk Table                     Men   Women  1/2 Average Risk   3.4   3.3  Average Risk       5.0   4.4  2 X Average Risk   9.6   7.1  3 X Average Risk  23.4   11.0        Use the  calculated Patient Ratio above and the CHD Risk Table to determine the patient's CHD Risk.        ATP III CLASSIFICATION (LDL):  <100     mg/dL   Optimal  782-956  mg/dL   Near or Above                    Optimal  130-159  mg/dL   Borderline  213-086  mg/dL   High  >578     mg/dL   Very High Performed at [facility], [address], [place], [place] [address]   VITAMIN D 25 Hydroxy (Vit-D Deficiency, Fractures)     Status: Abnormal   Collection Time: 03/05/23  6:32 AM  Result Value Ref Range   Vit D, 25-Hydroxy 10.38 (L) 30 - 100 ng/mL    Comment: (NOTE) Vitamin D deficiency has been defined by the Institute of Medicine  and an Endocrine Society practice guideline as a level of serum 25-OH  vitamin D less than 20 ng/mL (1,2). The Endocrine Society went on to  further define vitamin D insufficiency  as a level between 21 and 29  ng/mL (2).  1. IOM (Institute of Medicine). 2010. Dietary reference intakes for  calcium and D. [place]: [employer]. 2. Holick MF, Binkley Mims, Bischoff-Ferrari HA, et al. Evaluation,  treatment, and prevention of vitamin D deficiency: an Endocrine  Society clinical practice guideline, JCEM. 2011 Jul; 96(7): 1911-30.  Performed at [facility], [address], [place], [place] [address]   TSH     Status: Abnormal   Collection Time: 03/05/23  6:32 AM  Result Value Ref Range   TSH 7.741 (H) 0.350 - 4.500 uIU/mL    Comment: Performed by a 3rd Generation assay with a functional sensitivity of <=0.01 uIU/mL. Performed at [facility], [address] [place], [place] [address]    Blood Alcohol level:  Lab Results  Component Value Date   ETH <10 06/23/2020   ETH <10 06/15/2020   Metabolic Disorder Labs: Lab Results  Component Value Date   HGBA1C 5.1 06/24/2020   MPG 100 06/24/2020   No results found for: "PROLACTIN" Lab Results  Component Value Date   CHOL 186 03/05/2023    TRIG 79 03/05/2023   HDL 72 03/05/2023   CHOLHDL 2.6 03/05/2023   VLDL 16 03/05/2023   LDLCALC 98 03/05/2023   LDLCALC 80 06/24/2020   Physical Findings: AIMS:  , ,  ,  ,    CIWA:  CIWA-Ar Total: 3 COWS:     Musculoskeletal: Strength & Muscle Tone: within normal limits Gait & Station: normal Patient leans: N/A  Psychiatric Specialty Exam:  Presentation  General Appearance:  Casual; Fairly Groomed  Eye Contact: Fair  Speech: Clear and Coherent  Speech Volume: Normal  Handedness: Right   Mood and Affect  Mood: Depressed; Anxious  Affect: Congruent  Thought Process  Thought Processes: Coherent  Descriptions of Associations:Intact  Orientation:Full (Time, Place and Person)  Thought Content:Logical  History of Schizophrenia/Schizoaffective disorder:No  Duration of Psychotic Symptoms:Greater than six months  Hallucinations:Hallucinations: None  Ideas of Reference:None  Suicidal Thoughts:Suicidal Thoughts: No  Homicidal Thoughts:Homicidal Thoughts: No   Sensorium  Memory: Immediate Good  Judgment: Fair  Insight: Fair  Art therapist  Concentration: Fair  Attention Span: Fair  Recall: [employer] of Knowledge: Fair  Language: Fair  Psychomotor Activity  Psychomotor Activity: Psychomotor Activity: Normal  Assets  Assets: Resilience  Sleep  Sleep: Sleep: Good  Physical Exam: Physical Exam Constitutional:      Appearance: Normal appearance.  HENT:     Head: Normocephalic.  Eyes:     Pupils: Pupils are equal, round, and reactive to light.  Musculoskeletal:        General: Normal range of motion.     Cervical back: Normal range of motion.  Neurological:     Mental Status: She is alert and oriented to person, place, and time.    Review of Systems  Constitutional:  Negative for fever.  HENT:  Negative for hearing loss.   Eyes:  Negative for blurred vision.  Respiratory:  Negative for cough.    Cardiovascular:  Negative for chest pain.  Gastrointestinal:  Negative for heartburn.  Genitourinary:  Negative for dysuria.  Musculoskeletal:  Negative for myalgias.  Neurological:  Negative for dizziness.  Psychiatric/Behavioral:  Positive for depression, hallucinations and substance abuse. Negative for memory loss and suicidal ideas. The patient is nervous/anxious and has insomnia.    Blood pressure (!) 141/86, pulse (!) 113, temperature 98 F (36.7 C), temperature source  Oral, resp. rate 14, height  (1.626 m), weight 68.8 kg, SpO2 100 %. Body mass index is 26.02 kg/m.  Treatment Plan Summary: Daily contact with patient to assess and evaluate symptoms and progress in treatment and Medication management.   Continue inpatient hospitalization.  Will continue today 03/06/2023 plan as below except where it is noted.    PLAN Safety and Monitoring: Voluntary admission to inpatient psychiatric unit for safety, stabilization and treatment Daily contact with patient to assess and evaluate symptoms and progress in treatment Patient's case to be discussed in multi-disciplinary team meeting Observation Level : q15 minute checks Vital signs: q12 hours Precautions: Safety    Diagnoses Principal Problem:   MDD (major depressive disorder), recurrent, severe, with psychosis Active Problems:   Alcohol use disorder   Cocaine use disorder   Tobacco use disorder   MDD with Psychotic features -Continue Risperdal 1 mg BID for psychosis  -Continue Prozac 20 mg po daily.  -Increased gabapentin to 300 mg po tid for anxiety/agitation. -Increased trazodone to 100 mg po q hs routinely for sleep.   GERD -Continue Sucralfate 1 mg QID (home med) -Continue  Protonix 40 mg (home med)   Hypertension -Continue Losartan 50 mg po daily  -Continue Clonidine for SBP>160 or DBP>100   Anxiety/CIWA < or =10 -Continue Hydroxyzine 25 mg every 6 hours PRN   CIWA > 10 -Continue Ativan  tabs every 6  hours PRN   Other PRNS -Continue Agitation Protocol medications-See MAR for complete order (Risperidone & Geodon) -Continue Tylenol 650 mg every 6 hours PRN for mild pain -Continue Maalox 30 mg every 4 hrs PRN for indigestion -Continue Imodium 2-4 mg as needed for diarrhea -Continue Milk of Magnesia as needed every 6 hrs for constipation -Continue Zofran disintegrating tabs every 6 hrs PRN for nausea    Discharge Planning: Social work and case management to assist with discharge planning and identification of hospital follow-up needs prior to discharge Estimated LOS: 5-7 days Discharge Concerns: Need to establish a safety plan; Medication compliance and effectiveness Discharge Goals: Return home with outpatient referrals for mental health follow-up including medication management/psychotherapy   I certify that inpatient services furnished can reasonably be expected to improve the patient's condition.    Armandina Stammer, NP, pmhnp, fnp-bc 03/06/2023, 8:52 AMPatient ID: Penne Lash, female   DOB: 12-26-1970, 52 y.o.   MRN: [id number]

## 2023-03-06 NOTE — [facility] Group Notes (Signed)
[facility] Group Notes:  (Nursing/MHT/Case Management/Adjunct)  Date:  03/06/2023  Time:  2000  Type of Therapy:   wrap up group  Participation Level:  Active  Participation Quality:  Appropriate, Attentive, Sharing, and Supportive  Affect:  Appropriate  Cognitive:  Alert  Insight:  Limited  Engagement in Group:  Engaged  Modes of Intervention:  Clarification, Education, and Support  Summary of Progress/Problems: Positive thinking and positive change were discussed.  Marcille Buffy 03/06/2023, 10:25 PM

## 2023-03-06 NOTE — Group Note (Signed)
LCSW Group Therapy Note  Group Date: 03/06/2023 Start Time: 1100 End Time: 1200   Type of Therapy and Topic:  Group Therapy - How To Cope with Nervousness about Discharge   Participation Level:  Active   Description of Group This process group involved identification of patients' feelings about discharge. Some of them are scheduled to be discharged soon, while others are new admissions, but each of them was asked to share thoughts and feelings surrounding discharge from the hospital. One common theme was that they are excited at the prospect of going home, while another was that many of them are apprehensive about sharing why they were hospitalized. Patients were given the opportunity to discuss these feelings with their peers in preparation for discharge.  Therapeutic Goals  Patient will identify their overall feelings about pending discharge. Patient will think about how they might proactively address issues that they believe will once again arise once they get home (i.e. with parents). Patients will participate in discussion about having hope for change.   Summary of Patient Progress:  Patient  was very active throughout the session. Patient demonstrated great insight into the subject matter, and proved open to input from peers and feedback from CSW. Patient shared what her plans were to help prepare her for DC. Patient was respectful of peers and participated throughout the entire session.    Therapeutic Modalities Cognitive Behavioral Therapy   Beather Arbour 03/06/2023  1:01 PM

## 2023-03-06 NOTE — Progress Notes (Addendum)
Received a call from behavioral health regarding abnormal labs with elevated TSH 7.74, with TSH of 0.59.  Unknown if the patient has a past medical history of hypothyroidism, she has not seen a doctor in a long time.  Discussed the case with NP Orlinda Blalock.  Advised to start levothyroxine 50 mcg daily.  The patient has been hemodynamically stable.  No other medical concerns.  Added TOC to assist with finding a provider for the patient outpatient.  She will need to follow-up with a PCP and repeat TSH and free T4 in 6 weeks.   No charge note.

## 2023-03-06 NOTE — Progress Notes (Signed)
   03/06/23 1122  Psych Admission Type (Psych Patients Only)  Admission Status Voluntary  Psychosocial Assessment  Patient Complaints Depression;Worrying  Eye Contact Fair  Facial Expression Flat  Affect Preoccupied  Speech Logical/coherent  Interaction Assertive  Motor Activity Slow  Appearance/Hygiene Unremarkable  Behavior Characteristics Cooperative;Calm  Mood Depressed;Pleasant  Thought Process  Coherency Disorganized  Content Preoccupation  Delusions Paranoid  Perception WDL  Hallucination None reported or observed  Judgment Impaired  Confusion None  Danger to Self  Current suicidal ideation? Denies  Agreement Not to Harm Self Yes  Description of Agreement verbal  Danger to Others  Danger to Others None reported or observed

## 2023-03-07 ENCOUNTER — Encounter (HOSPITAL_COMMUNITY): Payer: Self-pay

## 2023-03-07 DIAGNOSIS — F333 Major depressive disorder, recurrent, severe with psychotic symptoms: Secondary | ICD-10-CM

## 2023-03-07 MED ORDER — HYDROXYZINE HCL 50 MG PO TABS
50.0000 mg | ORAL_TABLET | Freq: Once | ORAL | Status: AC
  Administered 2023-03-07: 50 mg via ORAL
  Filled 2023-03-07 (×2): qty 1

## 2023-03-07 MED ORDER — TRAZODONE HCL 100 MG PO TABS
100.0000 mg | ORAL_TABLET | Freq: Every day | ORAL | Status: DC
  Administered 2023-03-07 – 2023-03-08 (×2): 100 mg via ORAL
  Filled 2023-03-07 (×4): qty 1

## 2023-03-07 MED ORDER — HYDROXYZINE HCL 25 MG PO TABS: 25.0000 mg | ORAL_TABLET | Freq: Three times a day (TID) | ORAL | Status: DC | PRN

## 2023-03-07 MED ORDER — GABAPENTIN 300 MG PO CAPS
300.0000 mg | ORAL_CAPSULE | Freq: Three times a day (TID) | ORAL | Status: DC
  Administered 2023-03-07 – 2023-03-09 (×6): 300 mg via ORAL
  Filled 2023-03-07 (×12): qty 1

## 2023-03-07 NOTE — Progress Notes (Signed)
   03/06/23 2200  Psych Admission Type (Psych Patients Only)  Admission Status Voluntary  Psychosocial Assessment  Patient Complaints Depression;Worrying;Anxiety  Eye Contact Fair  Facial Expression Flat  Affect Preoccupied  Speech Logical/coherent  Interaction Assertive  Motor Activity Slow  Appearance/Hygiene Unremarkable  Behavior Characteristics Cooperative  Mood Anxious;Pleasant  Thought Process  Coherency Disorganized  Content Preoccupation  Delusions None reported or observed  Perception WDL  Hallucination None reported or observed  Judgment Impaired  Confusion None  Danger to Self  Current suicidal ideation? Denies  Agreement Not to Harm Self Yes  Description of Agreement Verbal contract for safety  Danger to Others  Danger to Others None reported or observed   Pt reports 7/10 anxiety and 8/10 depression. Pt was offered support, reassurance, and encouragement. Pt was given scheduled medications and PRN Mylicon per MAR. Pt was encouraged to attend groups. Pt attended group and interacts well with peers and staff. Q 15 minute checks were done for safety. Pt receptive to treatment and safety maintained on unit.

## 2023-03-07 NOTE — Progress Notes (Signed)
[facility] MD Progress Note  03/07/2023 1:50 PM Lynnleigh Soden  MRN:  [id number]  Principal Problem: MDD (major depressive disorder), recurrent, severe, with psychosis (HCC)  Diagnosis: Principal Problem:   MDD (major depressive disorder), recurrent, severe, with psychosis (HCC) Active Problems:   Alcohol use disorder   Cocaine use disorder (HCC)   Tobacco use disorder  Reason For Admission: Cesia Bellissimo is a 52 yo AAF with prior mental health diagnoses of cocaine use d/o & mdd who was transported to the [facility] by [facility] after bystanders found her down and called 911. While at the ER, pt reported suicidal ideations, stated that she had used a razor to cut her left wrist & used cocaine prior to being found. She reported worsening depressive symptoms in the context of multiple psychosocial stressors, and was transferred voluntarily to this [facility] for treatment and stabilization of her mental status.   Daily notes: Anokhi is seen. Chart reviewed. The chart findings discussed with the treatment team. She presents alert, oriented, aware of situation. She is visible on the unit, attending group sessions. She presents fairly groomed today. She reports, "I feel okay right now, but my mood keep changing from fairly good to irritable. I did not sleep well last night. That could be the reason I feel restless this morning. My anxiety is very high today. I have been able to attend the group sessions yesterday & this morning. I can admit that the group session is informative. It has been helpful so far. I have learned to pray. The sleep medicine I take at night does not really keep me asleep, but makes me feel drowsy in the morning. I have not been calling places to go to after discharge, but no good news as of yet. Please, can I have something for anxiety. My depression today is #6 & anxiety #7. Sometimes, I get very discouraged because I have used drugs for so long & I'm wondering will I ever be okay. This is why I  feel suicidal at times. I'm trying to hang in there & not give up. Life for me is a struggle. It has always been. Mccartney currently is endorsing passive SI. Denies any HI, AVH, delusional thoughts or paranoia. She does not appear to be responding to any internal stimuli. Elianys is started on Hydroxyzine for anxiety complaints. Has changed the dosing time for Trazodone 100 & bedtime gabapentin to prevent daytime drowsiness. Patient says she feels safe here. She was started on Levothyroxine 50 mcg yesterday for high TSH & low T4. Olena remains on the current plan of care as already in progress.  Labs Reviewed: Ordered Vitamin D 50.000 units weekly for vitamin D deficiency. Ordered Free T3 & Free T4 for Elevated TSH level.  Total Time spent with patient:  35 minutes  Past Psychiatric History: See H&P  Past Medical History:  Past Medical History:  Diagnosis Date   Cocaine abuse (HCC)    Depression    HTN (hypertension)    Iron deficiency anemia     Past Surgical History:  Procedure Laterality Date   TUBAL LIGATION     Family History:  Family History  Problem Relation Age of Onset   Hypertension Mother    Diabetes Mother    CVA Mother    Heart attack Mother    Colon cancer Maternal Uncle    Family Psychiatric  History: See H&P Social History:  Social History   Substance and Sexual Activity  Alcohol Use Yes  Comment: "sometimes every other day," "1 to 2 cans of beer or a glass or two of wine"     Social History   Substance and Sexual Activity  Drug Use Yes   Types: Cocaine   Comment: 3 to 4 times a week, last use on Thursday (06/22/2020)    Social History   Socioeconomic History   Marital status: Single    Spouse name: Not on file   Number of children: Not on file   Years of education: Not on file   Highest education level: Not on file  Occupational History   Not on file  Tobacco Use   Smoking status: Every Day    Types: Cigarettes   Smokeless tobacco: Never    Tobacco comments:    10 cigarettes/day  Vaping Use   Vaping Use: Never used  Substance and Sexual Activity   Alcohol use: Yes    Comment: "sometimes every other day," "1 to 2 cans of beer or a glass or two of wine"   Drug use: Yes    Types: Cocaine    Comment: 3 to 4 times a week, last use on Thursday (06/22/2020)   Sexual activity: Not Currently  Other Topics Concern   Not on file  Social History Narrative   Not on file   Social Determinants of Health   Financial Resource Strain: Not on file  Food Insecurity: No Food Insecurity (03/02/2023)   Hunger Vital Sign    Worried About Running Out of Food in the Last Year: Never true    Ran Out of Food in the Last Year: Never true  Transportation Needs: No Transportation Needs (03/02/2023)   PRAPARE - Administrator, Civil Service (Medical): No    Lack of Transportation (Non-Medical): No  Physical Activity: Not on file  Stress: Not on file  Social Connections: Not on file   Sleep: Good  Appetite:  Good  Current Medications: Current Facility-Administered Medications  Medication Dose Route Frequency Provider Last Rate Last Admin   acetaminophen (TYLENOL) tablet 650 mg  650 mg Oral Q6H PRN Bobbitt, Shalon E, NP   650 mg at 03/06/23 0912   alum & mag hydroxide-simeth (MAALOX/MYLANTA) 200-200-20 MG/5ML suspension 30 mL  30 mL Oral Q4H PRN Bobbitt, Shalon E, NP   30 mL at 03/03/23 1610   cloNIDine (CATAPRES) tablet 0.1 mg  0.1 mg Oral BID PRN Starleen Blue, NP   0.1 mg at 03/04/23 1705   diphenhydrAMINE (BENADRYL) capsule 50 mg  50 mg Oral TID PRN Bobbitt, Shalon E, NP       Or   diphenhydrAMINE (BENADRYL) injection 50 mg  50 mg Intramuscular TID PRN Bobbitt, Shalon E, NP       FLUoxetine (PROZAC) capsule 20 mg  20 mg Oral Daily Nkwenti, Doris, NP   20 mg at 03/07/23 9604   gabapentin (NEURONTIN) capsule 300 mg  300 mg Oral TID Armandina Stammer I, NP       haloperidol (HALDOL) tablet 5 mg  5 mg Oral TID PRN Bobbitt, Shalon E, NP        Or   haloperidol lactate (HALDOL) injection 5 mg  5 mg Intramuscular TID PRN Bobbitt, Franchot Mimes, NP       hydrocerin (EUCERIN) cream   Topical Daily Starleen Blue, NP   Given at 03/06/23 0913   hydrOXYzine (ATARAX) tablet 25 mg  25 mg Oral TID PRN Sanjuana Kava, NP       hydrOXYzine (ATARAX)  tablet 50 mg  50 mg Oral Once Armandina Stammer I, NP       levothyroxine (SYNTHROID) tablet 50 mcg  50 mcg Oral Q0600 Darlin Drop, DO   50 mcg at 03/07/23 1610   losartan (COZAAR) tablet 50 mg  50 mg Oral Daily Starleen Blue, NP   50 mg at 03/07/23 9604   magnesium hydroxide (MILK OF MAGNESIA) suspension 30 mL  30 mL Oral Daily PRN Bobbitt, Shalon E, NP   30 mL at 03/05/23 2119   multivitamin with minerals tablet 1 tablet  1 tablet Oral Daily Leevy-Johnson, Brooke A, NP   1 tablet at 03/07/23 5409   nicotine (NICODERM CQ - dosed in mg/24 hours) patch 14 mg  14 mg Transdermal Daily Attiah, Nadir, MD       pantoprazole (PROTONIX) EC tablet 40 mg  40 mg Oral QAC breakfast Starleen Blue, NP   40 mg at 03/07/23 8119   risperiDONE (RISPERDAL M-TABS) disintegrating tablet 2 mg  2 mg Oral Q8H PRN Bobbitt, Shalon E, NP       And   ziprasidone (GEODON) injection 20 mg  20 mg Intramuscular PRN Bobbitt, Shalon E, NP       risperiDONE (RISPERDAL) tablet 1 mg  1 mg Oral BID Nkwenti, Doris, NP   1 mg at 03/07/23 1478   simethicone (MYLICON) chewable tablet 160 mg  160 mg Oral Q6H PRN Sindy Guadeloupe, NP   160 mg at 03/06/23 2200   sodium chloride (OCEAN) 0.65 % nasal spray 1 spray  1 spray Each Nare PRN Starleen Blue, NP   1 spray at 03/07/23 0832   sucralfate (CARAFATE) tablet 1 g  1 g Oral TID WC & HS Nkwenti, Doris, NP   1 g at 03/07/23 1254   thiamine (Vitamin B-1) tablet 100 mg  100 mg Oral Daily Leevy-Johnson, Brooke A, NP   100 mg at 03/07/23 2956   traZODone (DESYREL) tablet 100 mg  100 mg Oral QHS Mayes Sangiovanni I, NP       Vitamin D (Ergocalciferol) (DRISDOL) 1.25 MG (50000 UNIT) capsule 50,000 Units  50,000  Units Oral Q7 days Starleen Blue, NP   50,000 Units at 03/05/23 1814    Lab Results:  Results for orders placed or performed during the hospital encounter of 03/02/23 (from the past 48 hour(s))  T4, free     Status: Abnormal   Collection Time: 03/06/23  6:23 AM  Result Value Ref Range   Free T4 0.59 (L) 0.61 - 1.12 ng/dL    Comment: (NOTE) Biotin ingestion may interfere with free T4 tests. If the results are inconsistent with the TSH level, previous test results, or the clinical presentation, then consider biotin interference. If needed, order repeat testing after stopping biotin. Performed at [facility], [address] [place], [place] [address]    Blood Alcohol level:  Lab Results  Component Value Date   ETH <10 06/23/2020   ETH <10 06/15/2020   Metabolic Disorder Labs: Lab Results  Component Value Date   HGBA1C 4.8 03/05/2023   MPG 91 03/05/2023   MPG 100 06/24/2020   No results found for: "PROLACTIN" Lab Results  Component Value Date   CHOL 186 03/05/2023   TRIG 79 03/05/2023   HDL 72 03/05/2023   CHOLHDL 2.6 03/05/2023   VLDL 16 03/05/2023   LDLCALC 98 03/05/2023   LDLCALC 80 06/24/2020   Physical Findings: AIMS:  , ,  ,  ,  CIWA:  CIWA-Ar Total: 0 COWS:     Musculoskeletal: Strength & Muscle Tone: within normal limits Gait & Station: normal Patient leans: N/A  Psychiatric Specialty Exam:  Presentation  General Appearance:  Casual; Fairly Groomed  Eye Contact: Good  Speech: Clear and Coherent; Normal Rate  Speech Volume: Normal  Handedness: Right   Mood and Affect  Mood: Anxious; Depressed; Irritable  Affect: Congruent; Depressed  Thought Process  Thought Processes: Coherent; Goal Directed; Linear  Descriptions of Associations:Intact  Orientation:Full (Time, Place and Person)  Thought Content:Logical  History of Schizophrenia/Schizoaffective disorder:No  Duration of Psychotic  Symptoms:N/A  Hallucinations:Hallucinations: None Description of Auditory Hallucinations: NA   Ideas of Reference:None  Suicidal Thoughts:Suicidal Thoughts: Yes, Passive SI Passive Intent and/or Plan: Without Intent; Without Plan; Without Means to Carry Out; Without Access to Means   Homicidal Thoughts:Homicidal Thoughts: No    Sensorium  Memory: Immediate Good; Recent Good; Remote Good  Judgment: Fair  Insight: Fair  Art therapist  Concentration: Fair  Attention Span: Fair  Recall: Good  Fund of Knowledge: Fair  Language: Good  Psychomotor Activity  Psychomotor Activity: Psychomotor Activity: Normal   Assets  Assets: Communication Skills; Desire for Improvement; Resilience; Physical Health  Sleep  Sleep: Sleep: Poor Number of Hours of Sleep: 4.5   Physical Exam: Physical Exam Constitutional:      Appearance: Normal appearance.  HENT:     Head: Normocephalic.  Eyes:     Pupils: Pupils are equal, round, and reactive to light.  Musculoskeletal:        General: Normal range of motion.     Cervical back: Normal range of motion.  Neurological:     Mental Status: She is alert and oriented to person, place, and time.    Review of Systems  Constitutional:  Negative for fever.  HENT:  Negative for hearing loss.   Eyes:  Negative for blurred vision.  Respiratory:  Negative for cough.   Cardiovascular:  Negative for chest pain.  Gastrointestinal:  Negative for heartburn.  Genitourinary:  Negative for dysuria.  Musculoskeletal:  Negative for myalgias.  Neurological:  Negative for dizziness.  Psychiatric/Behavioral:  Positive for depression, hallucinations and substance abuse. Negative for memory loss and suicidal ideas. The patient is nervous/anxious and has insomnia.    Blood pressure (!) 144/97, pulse (!) 103, temperature 98 F (36.7 C), temperature source Oral, resp. rate 14, height 5\' 4"  (1.626 m), weight 68.8 kg, SpO2 100 %. Body  mass index is 26.02 kg/m.  Treatment Plan Summary: Daily contact with patient to assess and evaluate symptoms and progress in treatment and Medication management.   Continue inpatient hospitalization.  Will continue today 03/07/2023 plan as below except where it is noted.    PLAN Safety and Monitoring: Voluntary admission to inpatient psychiatric unit for safety, stabilization and treatment Daily contact with patient to assess and evaluate symptoms and progress in treatment Patient's case to be discussed in multi-disciplinary team meeting Observation Level : q15 minute checks Vital signs: q12 hours Precautions: Safety    Diagnoses Principal Problem:   MDD (major depressive disorder), recurrent, severe, with psychosis Active Problems:   Alcohol use disorder   Cocaine use disorder   Tobacco use disorder   MDD with Psychotic features -Continue Risperdal 1 mg BID for psychosis  -Continue Prozac 20 mg po daily.  -Continue gabapentin 300 mg po tid for anxiety/agitation. -Continue trazodone 100 mg po q hs routinely for sleep.  -Initiated Hydroxyzine 25 mg po tid prn for anxiety.  GERD -Continue Sucralfate 1 mg QID (home med) -Continue  Protonix 40 mg (home med).    Hypertension -Continue Losartan 50 mg po daily  -Continue Clonidine for SBP>160 or DBP>100.   Hypothyroidism. -Continue Levothyroxine 50 mcg po Q am.   Anxiety/CIWA < or =10 -Continue Hydroxyzine 25 mg every 6 hours PRN   CIWA > 10 -Continue Ativan 1mg  tabs every 6 hours PRN   Other PRNS -Continue Agitation Protocol medications-See MAR for complete order (Risperidone & Geodon) -Continue Tylenol 650 mg every 6 hours PRN for mild pain -Continue Maalox 30 mg every 4 hrs PRN for indigestion -Continue Imodium 2-4 mg as needed for diarrhea -Continue Milk of Magnesia as needed every 6 hrs for constipation -Continue Zofran disintegrating tabs every 6 hrs PRN for nausea    Discharge Planning: Social work and case  management to assist with discharge planning and identification of hospital follow-up needs prior to discharge Estimated LOS: 5-7 days Discharge Concerns: Need to establish a safety plan; Medication compliance and effectiveness Discharge Goals: Return home with outpatient referrals for mental health follow-up including medication management/psychotherapy   I certify that inpatient services furnished can reasonably be expected to improve the patient's condition.    Armandina Stammer, NP, pmhnp, fnp-bc 03/07/2023, 1:50 PMPatient ID: Penne Lash, female   DOB: [birth year], 52 y.o.   MRN: [id number] Patient ID: Azha Constantin, female   DOB: 01-03-1971, 52 y.o.   MRN: [id number]

## 2023-03-07 NOTE — BH IP Treatment Plan (Signed)
Interdisciplinary Treatment and Diagnostic Plan Update  03/07/2023 Time of Session: 1200 Marie Atkins MRN: [id number]  Principal Diagnosis: MDD (major depressive disorder), recurrent, severe, with psychosis (HCC)  Secondary Diagnoses: Principal Problem:   MDD (major depressive disorder), recurrent, severe, with psychosis (HCC) Active Problems:   Alcohol use disorder   Cocaine use disorder (HCC)   Tobacco use disorder   Current Medications:  Current Facility-Administered Medications  Medication Dose Route Frequency Provider Last Rate Last Admin   acetaminophen (TYLENOL) tablet 650 mg  650 mg Oral Q6H PRN Bobbitt, Shalon E, NP   650 mg at 03/07/23 1400   alum & mag hydroxide-simeth (MAALOX/MYLANTA) 200-200-20 MG/5ML suspension 30 mL  30 mL Oral Q4H PRN Bobbitt, Shalon E, NP   30 mL at 03/03/23 4098   cloNIDine (CATAPRES) tablet 0.1 mg  0.1 mg Oral BID PRN Starleen Blue, NP   0.1 mg at 03/04/23 1705   diphenhydrAMINE (BENADRYL) capsule 50 mg  50 mg Oral TID PRN Bobbitt, Shalon E, NP       Or   diphenhydrAMINE (BENADRYL) injection 50 mg  50 mg Intramuscular TID PRN Bobbitt, Shalon E, NP       FLUoxetine (PROZAC) capsule 20 mg  20 mg Oral Daily Nkwenti, Doris, NP   20 mg at 03/07/23 1191   gabapentin (NEURONTIN) capsule 300 mg  300 mg Oral TID Armandina Stammer I, NP   300 mg at 03/07/23 1400   haloperidol (HALDOL) tablet 5 mg  5 mg Oral TID PRN Bobbitt, Shalon E, NP       Or   haloperidol lactate (HALDOL) injection 5 mg  5 mg Intramuscular TID PRN Bobbitt, Franchot Mimes, NP       hydrocerin (EUCERIN) cream   Topical Daily Starleen Blue, NP   Given at 03/06/23 0913   hydrOXYzine (ATARAX) tablet 25 mg  25 mg Oral TID PRN Armandina Stammer I, NP       levothyroxine (SYNTHROID) tablet 50 mcg  50 mcg Oral Q0600 Darlin Drop, DO   50 mcg at 03/07/23 4782   losartan (COZAAR) tablet 50 mg  50 mg Oral Daily Nkwenti, Tyler Aas, NP   50 mg at 03/07/23 9562   magnesium hydroxide (MILK OF MAGNESIA) suspension 30 mL   30 mL Oral Daily PRN Bobbitt, Shalon E, NP   30 mL at 03/05/23 2119   multivitamin with minerals tablet 1 tablet  1 tablet Oral Daily Leevy-Johnson, Brooke A, NP   1 tablet at 03/07/23 1308   nicotine (NICODERM CQ - dosed in mg/24 hours) patch 14 mg  14 mg Transdermal Daily Attiah, Nadir, MD       pantoprazole (PROTONIX) EC tablet 40 mg  40 mg Oral QAC breakfast Nkwenti, Doris, NP   40 mg at 03/07/23 6578   risperiDONE (RISPERDAL M-TABS) disintegrating tablet 2 mg  2 mg Oral Q8H PRN Bobbitt, Shalon E, NP       And   ziprasidone (GEODON) injection 20 mg  20 mg Intramuscular PRN Bobbitt, Shalon E, NP       risperiDONE (RISPERDAL) tablet 1 mg  1 mg Oral BID Nkwenti, Doris, NP   1 mg at 03/07/23 0832   simethicone (MYLICON) chewable tablet 160 mg  160 mg Oral Q6H PRN Sindy Guadeloupe, NP   160 mg at 03/06/23 2200   sodium chloride (OCEAN) 0.65 % nasal spray 1 spray  1 spray Each Nare PRN Starleen Blue, NP   1 spray at 03/07/23 0832   sucralfate (CARAFATE)  tablet 1 g  1 g Oral TID WC & HS Nkwenti, Doris, NP   1 g at 03/07/23 1254   thiamine (Vitamin B-1) tablet 100 mg  100 mg Oral Daily Leevy-Johnson, Brooke A, NP   100 mg at 03/07/23 2956   traZODone (DESYREL) tablet 100 mg  100 mg Oral QHS Nwoko, Agnes I, NP       Vitamin D (Ergocalciferol) (DRISDOL) 1.25 MG (50000 UNIT) capsule 50,000 Units  50,000 Units Oral Q7 days Starleen Blue, NP   50,000 Units at 03/05/23 1814   PTA Medications: Medications Prior to Admission  Medication Sig Dispense Refill Last Dose   amLODipine (NORVASC) 5 MG tablet Take 5 mg by mouth daily.   Past Week   dicyclomine (BENTYL) 20 MG tablet Take 1 tablet (20 mg total) by mouth every 6 (six) hours as needed for spasms. 30 tablet 1 Past Week   ferrous sulfate 325 (65 FE) MG EC tablet Take 325 mg by mouth daily with breakfast.   Past Week   metroNIDAZOLE (METROGEL) 0.75 % vaginal gel Place 1 Applicatorful vaginally at bedtime.   Past Month   Multiple Vitamins-Minerals  (MULTIVITAMIN WITH MINERALS) tablet Take 1 tablet by mouth daily.   Past Week   sucralfate (CARAFATE) 1 g tablet Take 1 tablet (1 g total) by mouth 4 (four) times daily -  with meals and at bedtime. Dissolve 1 tablet in 30 mL of water and drink 4 times daily. 120 tablet 0 Past Week   alum & mag hydroxide-simeth (MYLANTA MAXIMUM STRENGTH) 400-400-40 MG/5ML suspension Take 15 mLs by mouth every 6 (six) hours as needed for indigestion. (Patient not taking: Reported on 03/02/2023) 355 mL 0    pantoprazole (PROTONIX) 40 MG tablet Take 1 tablet (40 mg total) by mouth daily before breakfast for 14 days. 14 tablet 0     Patient Stressors: Marital or family conflict   Substance abuse    Patient Strengths: Communication skills   Treatment Modalities: Medication Management, Group therapy, Case management,  1 to 1 session with clinician, Psychoeducation, Recreational therapy.   Physician Treatment Plan for Primary Diagnosis: MDD (major depressive disorder), recurrent, severe, with psychosis (HCC) Long Term Goal(s): Improvement in symptoms so as ready for discharge   Short Term Goals: Ability to identify changes in lifestyle to reduce recurrence of condition will improve Ability to verbalize feelings will improve Ability to disclose and discuss suicidal ideas Ability to demonstrate self-control will improve Ability to identify and develop effective coping behaviors will improve Ability to maintain clinical measurements within normal limits will improve Compliance with prescribed medications will improve Ability to identify triggers associated with substance abuse/mental health issues will improve  Medication Management: Evaluate patient's response, side effects, and tolerance of medication regimen.  Therapeutic Interventions: 1 to 1 sessions, Unit Group sessions and Medication administration.  Evaluation of Outcomes: Progressing  Physician Treatment Plan for Secondary Diagnosis: Principal  Problem:   MDD (major depressive disorder), recurrent, severe, with psychosis (HCC) Active Problems:   Alcohol use disorder   Cocaine use disorder (HCC)   Tobacco use disorder  Long Term Goal(s): Improvement in symptoms so as ready for discharge   Short Term Goals: Ability to identify changes in lifestyle to reduce recurrence of condition will improve Ability to verbalize feelings will improve Ability to disclose and discuss suicidal ideas Ability to demonstrate self-control will improve Ability to identify and develop effective coping behaviors will improve Ability to maintain clinical measurements within normal limits will improve Compliance  with prescribed medications will improve Ability to identify triggers associated with substance abuse/mental health issues will improve     Medication Management: Evaluate patient's response, side effects, and tolerance of medication regimen.  Therapeutic Interventions: 1 to 1 sessions, Unit Group sessions and Medication administration.  Evaluation of Outcomes: Progressing   RN Treatment Plan for Primary Diagnosis: MDD (major depressive disorder), recurrent, severe, with psychosis (HCC) Long Term Goal(s): Knowledge of disease and therapeutic regimen to maintain health will improve  Short Term Goals: Ability to remain free from injury will improve, Ability to verbalize frustration and anger appropriately will improve, Ability to demonstrate self-control, Ability to participate in decision making will improve, Ability to verbalize feelings will improve, Ability to disclose and discuss suicidal ideas, Ability to identify and develop effective coping behaviors will improve, and Compliance with prescribed medications will improve  Medication Management: RN will administer medications as ordered by provider, will assess and evaluate patient's response and provide education to patient for prescribed medication. RN will report any adverse and/or side  effects to prescribing provider.  Therapeutic Interventions: 1 on 1 counseling sessions, Psychoeducation, Medication administration, Evaluate responses to treatment, Monitor vital signs and CBGs as ordered, Perform/monitor CIWA, COWS, AIMS and Fall Risk screenings as ordered, Perform wound care treatments as ordered.  Evaluation of Outcomes: Progressing   LCSW Treatment Plan for Primary Diagnosis: MDD (major depressive disorder), recurrent, severe, with psychosis (HCC) Long Term Goal(s): Safe transition to appropriate next level of care at discharge, Engage patient in therapeutic group addressing interpersonal concerns.  Short Term Goals: Engage patient in aftercare planning with referrals and resources, Increase social support, Increase ability to appropriately verbalize feelings, Increase emotional regulation, Facilitate acceptance of mental health diagnosis and concerns, Facilitate patient progression through stages of change regarding substance use diagnoses and concerns, Identify triggers associated with mental health/substance abuse issues, and Increase skills for wellness and recovery  Therapeutic Interventions: Assess for all discharge needs, 1 to 1 time with Social worker, Explore available resources and support systems, Assess for adequacy in community support network, Educate family and significant other(s) on suicide prevention, Complete Psychosocial Assessment, Interpersonal group therapy.  Evaluation of Outcomes: Progressing   Progress in Treatment: Attending groups: Yes. Participating in groups: Yes. Taking medication as prescribed: Yes. Toleration medication: Yes. Family/Significant other contact made: Yes, individual(s) contacted:  -[name] ( sister) [phone] Patient understands diagnosis: Yes. Discussing patient identified problems/goals with staff: Yes. Medical problems stabilized or resolved: Yes. Denies suicidal/homicidal ideation: Yes. Issues/concerns per  patient self-inventory: Yes. Other: N/A  New problem(s) identified: No, Describe:  None Reported  New Short Term/Long Term Goal(s): medication stabilization, elimination of SI thoughts, development of comprehensive mental wellness plan.     Patient Goals:  Medication Stabilization  Discharge Plan or Barriers: CSW will continue to follow and assess for appropriate referrals and possible discharge planning.   Reason for Continuation of Hospitalization: Anxiety Depression Medication stabilization Suicidal ideation Withdrawal symptoms  Estimated Length of Stay: 3-7 Days  Last 3 [place] Suicide Severity Risk Score: Flowsheet Row Ad[facility] (Current) [facility] from 03/01/2023 in [facility] from [facility]29/2023 in [facility]  C-SSRS RISK CATEGORY High Risk High Risk Low Risk       Last PHQ 2/9 Scores:    06/16/2020    4:08 PM 06/15/2020    9:56 AM  Depression screen PHQ 2/9  Decreased Interest 2 1  Down, Depressed,  Hopeless 2 1  PHQ - 2 Score 4 2  Altered sleeping 2 1  Tired, decreased energy 2 1  Change in appetite 2 1  Feeling bad or failure about yourself  2 2  Trouble concentrating 2 1  Moving slowly or fidgety/restless 2 1  Suicidal thoughts 1 0  PHQ-9 Score 17 9  Difficult doing work/chores Somewhat difficult Somewhat difficult   detox, medication management for mood stabilization; elimination of SI thoughts; development of comprehensive mental wellness/sobriety plan  Scribe for Treatment Team: Ane Payment, LCSW 03/07/2023 2:35 PM

## 2023-03-07 NOTE — BHH Counselor (Addendum)
[facility]U LCSW Progress Note   03/07/2023    1:27 PM  Marie Atkins      Type of Note: [facility]   CSW sent patient referral over today.    Patient was accepted to [facility], admission day is scheduled for Tuesday 03/11/2023 @ 9AM. CSW then called sister and informed her of this information.    Signed:   Jacob Moores, MSW, [facility] 03/07/2023 1:27 PM

## 2023-03-07 NOTE — BHH Group Notes (Signed)
Adult Psychoeducational Group Note  Date:  03/07/2023 Time:  10:20 PM  Group Topic/Focus:  Wrap-Up Group:  Alcohol Anonymous Group   Participation Level:  Active  Participation Quality:  Appropriate  Affect:  Appropriate  Cognitive:  Appropriate  Insight: Appropriate  Engagement in Group:    Modes of Intervention:  Discussion  Additional Comments:  Pt attended AA group 

## 2023-03-07 NOTE — B[facility] Group Notes (Signed)
Adult Psychoeducational Group Note  Date:  03/07/2023 Time:  11:25 AM  Group Topic/Focus:  Goals Group:   The focus of this group is to help patients establish daily goals to achieve during treatment and discuss how the patient can incorporate goal setting into their daily lives to aide in recovery.  Participation Level:  Active  Participation Quality:  Appropriate  Affect:  Appropriate  Cognitive:  Appropriate  Insight: Appropriate  Engagement in Group:  Engaged  Modes of Intervention:  Education and Problem-solving  Additional Comments:  PT participated  Gwinda Maine 03/07/2023, 11:25 AM

## 2023-03-07 NOTE — Plan of Care (Signed)
  Problem: Activity: Goal: Interest or engagement in activities will improve Outcome: Progressing   Problem: Coping: Goal: Ability to verbalize frustrations and anger appropriately will improve Outcome: Progressing   Problem: Safety: Goal: Periods of time without injury will increase Outcome: Progressing   

## 2023-03-07 NOTE — Progress Notes (Signed)
Patient rates anxiety 8/10 and depression 5/10 this morning. Patient requested medication for anxiety. Patient denies SI/HI/AVH and remains safe on the unit.   03/07/23 1000  Psych Admission Type (Psych Patients Only)  Admission Status Voluntary  Psychosocial Assessment  Patient Complaints Anxiety;Depression  Eye Contact Fair  Facial Expression Animated  Affect Anxious;Depressed;Preoccupied  Speech Logical/coherent  Interaction Assertive  Motor Activity Other (Comment) (WDL)  Appearance/Hygiene Unremarkable  Behavior Characteristics Cooperative;Appropriate to situation  Mood Anxious;Depressed  Thought Process  Coherency WDL  Content WDL  Delusions None reported or observed  Perception WDL  Hallucination None reported or observed  Judgment WDL  Confusion WDL  Danger to Self  Current suicidal ideation? Denies  Agreement Not to Harm Self Yes  Description of Agreement verbal  Danger to Others  Danger to Others None reported or observed

## 2023-03-08 DIAGNOSIS — F333 Major depressive disorder, recurrent, severe with psychotic symptoms: Secondary | ICD-10-CM | POA: Diagnosis not present

## 2023-03-08 LAB — T3, FREE: T3, Free: 2.1 pg/mL (ref 2.0–4.4)

## 2023-03-08 MED ORDER — MELATONIN 3 MG PO TABS
3.0000 mg | ORAL_TABLET | Freq: Every day | ORAL | Status: DC
  Administered 2023-03-08: 3 mg via ORAL
  Filled 2023-03-08 (×3): qty 1

## 2023-03-08 MED ORDER — NAPROXEN 500 MG PO TABS: 500.0000 mg | ORAL_TABLET | Freq: Two times a day (BID) | ORAL | Status: DC | PRN

## 2023-03-08 MED ORDER — FLUOXETINE HCL 10 MG PO CAPS
30.0000 mg | ORAL_CAPSULE | Freq: Every day | ORAL | Status: DC
  Administered 2023-03-09: 30 mg via ORAL
  Filled 2023-03-08 (×3): qty 3

## 2023-03-08 NOTE — Progress Notes (Signed)
Adult Psychoeducational Group Note  Date:  03/08/2023 Time:  9:37 PM  Group Topic/Focus:  Wrap-Up Group:   The focus of this group is to help patients review their daily goal of treatment and discuss progress on daily workbooks.  Participation Level:  Active  Participation Quality:  Appropriate  Affect:  Appropriate  Cognitive:  Appropriate  Insight: Appropriate  Engagement in Group:  Engaged  Modes of Intervention:  Discussion  Additional Comments:     Chauncey Fischer 03/08/2023, 9:37 PM

## 2023-03-08 NOTE — Progress Notes (Signed)
Pt is A&OX4, calm, denies suicidal ideations, denies homicidal ideations, denies auditory hallucinations and denies visual hallucinations. Pt verbally agrees to approach staff if these become apparent and before harming self or others. Pt denies experiencing nightmares. Mood and affect are congruent. Pt appetite is ok. No complaints of anxiety, distress, pain and/or discomfort at this time. Pt's memory appears to be grossly intact, and Pt hasn't displayed any injurious behaviors. Pt is medication compliant. There's no evidence of suicidal intent. Psychomotor activity was WNL. No s/s of Parkinson, Dystonia, Akathisia and/or Tardive Dyskinesia noted. CIWA discontinued due to over 24 hours of scores less than 4.

## 2023-03-08 NOTE — Progress Notes (Signed)
[facility] MD Progress Note  03/08/2023 1:17 PM Marie Atkins  MRN:  [id number] Principal Problem: MDD (major depressive disorder), recurrent, severe, with psychosis (HCC) Diagnosis: Principal Problem:   MDD (major depressive disorder), recurrent, severe, with psychosis (HCC) Active Problems:   Alcohol use disorder   Cocaine use disorder (HCC)   Tobacco use disorder  Reason For Admission: Marie Atkins is a 52 yo AAF with prior mental health diagnoses of cocaine use d/o & mdd who was transported to the [facility] by [facility] after bystanders found her down and called 911. While at the ER, pt reported suicidal ideations, stated that she had used a razor to cut her left wrist & used cocaine prior to being found. She reported worsening depressive symptoms in the context of multiple psychosocial stressors, and was transferred voluntarily to this [facility] for treatment and stabilization of her mental status.   24 hr chart review: Sleep Hours last night: Not documented, but pt reports a poor night's sleep Nursing Concerns: None reported Behavioral episodes in the past 24 hrs: none  Medication Compliance: compliant  Vital Signs in the past 24 hrs: HR with some mild elevations today PRN Medications in the past 24 hrs: None  Patient assessment note: Pt presents with a less depressed mood as compared to day of admission. Her attention to personal hygiene and grooming is fair, eye contact is good, speech is clear & coherent. Thought contents are organized and logical, and pt currently denies SI/HI/AVH or paranoia. There is no evidence of delusional thoughts.  Patient reports that she took Trazodone last night, but sleep was poor as she was waking up on an hourly basis. She reports a good appetite, reports anxiety of 7 (10 being worst). She reports pain of 5 in her back, asking for Naproxen, and is reporting depression as still being 8 (10 is worst).  We will increase Prozac to 30 mg daily starting on 4/28 for  management of depressive symptoms and anxiety. We are adding Melatonin 3 mg nightly for sleep, and Naproxen 500 mg BID PRN for pain. Continuing other medications as listed below. Plan is to discharge pt on Tuesday to [facility] treatment center as CSW states that she has been accepted to that facility for substance abuse rehab. There is a risk for relapse if discharged prior to being transferred to [facility].  Levothyroxine 50 mcg started yesterday for hypothyroidism.  Continuing this medication.  Total Time spent with patient: 45 minutes  Past Psychiatric History: See H & P  Past Medical History:  Past Medical History:  Diagnosis Date   Cocaine abuse (HCC)    Depression    HTN (hypertension)    Iron deficiency anemia     Past Surgical History:  Procedure Laterality Date   TUBAL LIGATION     Family History:  Family History  Problem Relation Age of Onset   Hypertension Mother    Diabetes Mother    CVA Mother    Heart attack Mother    Colon cancer Maternal Uncle    Family Psychiatric  History: See H & P Social History:  Social History   Substance and Sexual Activity  Alcohol Use Yes   Comment: "sometimes every other day," "1 to 2 cans of beer or a glass or two of wine"     Social History   Substance and Sexual Activity  Drug Use Yes   Types: Cocaine   Comment: 3 to 4 times a week, last use on Thursday (06/22/2020)  Social History   Socioeconomic History   Marital status: Single    Spouse name: Not on file   Number of children: Not on file   Years of education: Not on file   Highest education level: Not on file  Occupational History   Not on file  Tobacco Use   Smoking status: Every Day    Types: Cigarettes   Smokeless tobacco: Never   Tobacco comments:    10 cigarettes/day  Vaping Use   Vaping Use: Never used  Substance and Sexual Activity   Alcohol use: Yes    Comment: "sometimes every other day," "1 to 2 cans of beer or a glass or two of wine"   Drug use:  Yes    Types: Cocaine    Comment: 3 to 4 times a week, last use on Thursday (06/22/2020)   Sexual activity: Not Currently  Other Topics Concern   Not on file  Social History Narrative   Not on file   Social Determinants of Health   Financial Resource Strain: Not on file  Food Insecurity: No Food Insecurity (03/02/2023)   Hunger Vital Sign    Worried About Running Out of Food in the Last Year: Never true    Ran Out of Food in the Last Year: Never true  Transportation Needs: No Transportation Needs (03/02/2023)   PRAPARE - Administrator, Civil Service (Medical): No    Lack of Transportation (Non-Medical): No  Physical Activity: Not on file  Stress: Not on file  Social Connections: Not on file   Sleep: Good  Appetite:  Good  Current Medications: Current Facility-Administered Medications  Medication Dose Route Frequency Provider Last Rate Last Admin   acetaminophen (TYLENOL) tablet 650 mg  650 mg Oral Q6H PRN Bobbitt, Shalon E, NP   650 mg at 03/07/23 1400   alum & mag hydroxide-simeth (MAALOX/MYLANTA) 200-200-20 MG/5ML suspension 30 mL  30 mL Oral Q4H PRN Bobbitt, Shalon E, NP   30 mL at 03/03/23 1610   cloNIDine (CATAPRES) tablet 0.1 mg  0.1 mg Oral BID PRN Starleen Blue, NP   0.1 mg at 03/04/23 1705   diphenhydrAMINE (BENADRYL) capsule 50 mg  50 mg Oral TID PRN Bobbitt, Shalon E, NP       Or   diphenhydrAMINE (BENADRYL) injection 50 mg  50 mg Intramuscular TID PRN Bobbitt, Franchot Mimes, NP       [START ON 03/09/2023] FLUoxetine (PROZAC) capsule 30 mg  30 mg Oral Daily Sterlin Knightly, NP       gabapentin (NEURONTIN) capsule 300 mg  300 mg Oral TID Armandina Stammer I, NP   300 mg at 03/08/23 1303   haloperidol (HALDOL) tablet 5 mg  5 mg Oral TID PRN Bobbitt, Shalon E, NP       Or   haloperidol lactate (HALDOL) injection 5 mg  5 mg Intramuscular TID PRN Bobbitt, Franchot Mimes, NP       hydrocerin (EUCERIN) cream   Topical Daily Starleen Blue, NP   Given at 03/06/23 0913    hydrOXYzine (ATARAX) tablet 25 mg  25 mg Oral TID PRN Armandina Stammer I, NP       levothyroxine (SYNTHROID) tablet 50 mcg  50 mcg Oral Q0600 Dow Adolph N, DO   50 mcg at 03/08/23 9604   losartan (COZAAR) tablet 50 mg  50 mg Oral Daily Starleen Blue, NP   50 mg at 03/08/23 0827   magnesium hydroxide (MILK OF MAGNESIA) suspension 30 mL  30 mL Oral Daily PRN Bobbitt, Shalon E, NP   30 mL at 03/05/23 2119   melatonin tablet 3 mg  3 mg Oral QHS Genia Perin, NP       multivitamin with minerals tablet 1 tablet  1 tablet Oral Daily Leevy-Johnson, Brooke A, NP   1 tablet at 03/08/23 0827   naproxen (NAPROSYN) tablet 500 mg  500 mg Oral BID PRN Starleen Blue, NP       nicotine (NICODERM CQ - dosed in mg/24 hours) patch 14 mg  14 mg Transdermal Daily Attiah, Nadir, MD       pantoprazole (PROTONIX) EC tablet 40 mg  40 mg Oral QAC breakfast Starleen Blue, NP   40 mg at 03/08/23 7425   risperiDONE (RISPERDAL M-TABS) disintegrating tablet 2 mg  2 mg Oral Q8H PRN Bobbitt, Shalon E, NP       And   ziprasidone (GEODON) injection 20 mg  20 mg Intramuscular PRN Bobbitt, Shalon E, NP       risperiDONE (RISPERDAL) tablet 1 mg  1 mg Oral BID Rozell Theiler, NP   1 mg at 03/08/23 0827   simethicone (MYLICON) chewable tablet 160 mg  160 mg Oral Q6H PRN Sindy Guadeloupe, NP   160 mg at 03/07/23 1823   sodium chloride (OCEAN) 0.65 % nasal spray 1 spray  1 spray Each Nare PRN Starleen Blue, NP   1 spray at 03/07/23 0832   sucralfate (CARAFATE) tablet 1 g  1 g Oral TID WC & HS Kaoru Benda, NP   1 g at 03/08/23 1158   thiamine (Vitamin B-1) tablet 100 mg  100 mg Oral Daily Leevy-Johnson, Brooke A, NP   100 mg at 03/08/23 9563   traZODone (DESYREL) tablet 100 mg  100 mg Oral QHS Nwoko, Nicole Kindred I, NP   100 mg at 03/07/23 2139   Vitamin D (Ergocalciferol) (DRISDOL) 1.25 MG (50000 UNIT) capsule 50,000 Units  50,000 Units Oral Q7 days Starleen Blue, NP   50,000 Units at 03/05/23 1814    Lab Results:  No results found for  this or any previous visit (from the past 48 hour(s)).   Blood Alcohol level:  Lab Results  Component Value Date   ETH <10 06/23/2020   ETH <10 06/15/2020    Metabolic Disorder Labs: Lab Results  Component Value Date   HGBA1C 4.8 03/05/2023   MPG 91 03/05/2023   MPG 100 06/24/2020   No results found for: "PROLACTIN" Lab Results  Component Value Date   CHOL 186 03/05/2023   TRIG 79 03/05/2023   HDL 72 03/05/2023   CHOLHDL 2.6 03/05/2023   VLDL 16 03/05/2023   LDLCALC 98 03/05/2023   LDLCALC 80 06/24/2020    Physical Findings: AIMS:  , ,  ,  ,    CIWA:  CIWA-Ar Total: 0 COWS:     Musculoskeletal: Strength & Muscle Tone: within normal limits Gait & Station: normal Patient leans: N/A  Psychiatric Specialty Exam:  Presentation  General Appearance:  Appropriate for Environment; Fairly Groomed  Eye Contact: Good  Speech: Clear and Coherent  Speech Volume: Normal  Handedness: Right   Mood and Affect  Mood: Depressed; Anxious  Affect: Congruent   Thought Process  Thought Processes: Coherent  Descriptions of Associations:Intact  Orientation:Full (Time, Place and Person)  Thought Content:Logical  History of Schizophrenia/Schizoaffective disorder:No  Duration of Psychotic Symptoms:Greater than six months  Hallucinations:Hallucinations: None Description of Auditory Hallucinations: NA  Ideas of Reference:None  Suicidal Thoughts:Suicidal Thoughts: No  SI Passive Intent and/or Plan: Without Intent; Without Plan; Without Means to Carry Out; Without Access to Means  Homicidal Thoughts:Homicidal Thoughts: No   Sensorium  Memory: Immediate Good  Judgment: Fair  Insight: Fair   Art therapist  Concentration: Fair  Attention Span: Fair  Recall: [employer] of Knowledge: Fair  Language: Fair   Psychomotor Activity  Psychomotor Activity: Psychomotor Activity: Normal   Assets  Assets: Communication  Skills   Sleep  Sleep: Sleep: Poor Number of Hours of Sleep: 4.5    Physical Exam: Physical Exam Constitutional:      Appearance: Normal appearance.  HENT:     Head: Normocephalic.  Eyes:     Pupils: Pupils are equal, round, and reactive to light.  Musculoskeletal:        General: Normal range of motion.     Cervical back: Normal range of motion.  Neurological:     Mental Status: She is alert and oriented to person, place, and time.    Review of Systems  Constitutional:  Negative for fever.  HENT:  Negative for hearing loss.   Eyes:  Negative for blurred vision.  Respiratory:  Negative for cough.   Cardiovascular:  Negative for chest pain.  Gastrointestinal:  Negative for heartburn.  Genitourinary:  Negative for dysuria.  Musculoskeletal:  Negative for myalgias.  Neurological:  Negative for dizziness.  Psychiatric/Behavioral:  Positive for depression and substance abuse. Negative for hallucinations, memory loss and suicidal ideas. The patient is nervous/anxious and has insomnia.    Blood pressure 139/82, pulse (!) 101, temperature (!) 97.5 F (36.4 C), temperature source Oral, resp. rate 14, height 5\' 4"  (1.626 m), weight 68.8 kg, SpO2 100 %. Body mass index is 26.02 kg/m.   Treatment Plan Summary: Treatment Plan Summary: Daily contact with patient to assess and evaluate symptoms and progress in treatment and Medication management   Observation Level/Precautions:  15 minute checks  Laboratory:  Labs reviewed   Psychotherapy:  Unit Group sessions  Medications:  See [facility]  Consultations:  To be determined   Discharge Concerns:  Safety, medication compliance, mood stability  Estimated LOS: 5-7 days  Other:  N/A    PLAN Safety and Monitoring: Voluntary admission to inpatient psychiatric unit for safety, stabilization and treatment Daily contact with patient to assess and evaluate symptoms and progress in treatment Patient's case to be discussed in  multi-disciplinary team meeting Observation Level : q15 minute checks Vital signs: q12 hours Precautions: Safety   Long Term Goal(s): Improvement in symptoms so as ready for discharge   Short Term Goals: Ability to identify changes in lifestyle to reduce recurrence of condition will improve, Ability to verbalize feelings will improve, Ability to disclose and discuss suicidal ideas, Ability to demonstrate self-control will improve, Ability to identify and develop effective coping behaviors will improve, Ability to maintain clinical measurements within normal limits will improve, Compliance with prescribed medications will improve, and Ability to identify triggers associated with substance abuse/mental health issues will improve   Diagnoses Principal Problem:   MDD (major depressive disorder), recurrent, severe, with psychosis Active Problems:   Alcohol use disorder   Cocaine use disorder   Tobacco use disorder   MDD with Psychotic features -Continue Risperdal 1 mg BID for psychosis/mood stabilization -Increase Prozac to 300 mg   GERD -Continue Sucralfate 1 mg QID (home med) -Continue  Protonix 40 mg (home med)   Hypertension -Increase Losartan 50 mg daily  -Continue Clonidine for SBP>160 or DBP>100   Anxiety/CIWA <  or =10 -Continue Hydroxyzine 25 mg every 6 hours PRN  Hypothyroidism -Continue Synthroid 50 mcg daily    CIWA > 10 -Continue Ativan 1mg  tabs every 6 hours PRN   Other PRNS -Start Naproxen 500 mg BID PRN for pain -Continue Agitation Protocol medications-See MAR for complete order (Risperidone & Geodon) -Continue Tylenol 650 mg every 6 hours PRN for mild pain -Continue Maalox 30 mg every 4 hrs PRN for indigestion -Continue Imodium 2-4 mg as needed for diarrhea -Continue Milk of Magnesia as needed every 6 hrs for constipation -Continue Zofran disintegrating tabs every 6 hrs PRN for nausea    Discharge Planning: Social work and case management to assist with  discharge planning and identification of hospital follow-up needs prior to discharge Estimated LOS: 5-7 days Discharge Concerns: Need to establish a safety plan; Medication compliance and effectiveness Discharge Goals: Return home with outpatient referrals for mental health follow-up including medication management/psychotherapy   I certify that inpatient services furnished can reasonably be expected to improve the patient's condition.    Starleen Blue, NP 03/08/2023, 1:17 PMPatient ID: Penne Lash, female   DOB: 1971-01-16, 52 y.o.   MRN: [id number]

## 2023-03-08 NOTE — Progress Notes (Signed)
   03/07/23 2300  Psych Admission Type (Psych Patients Only)  Admission Status Voluntary  Psychosocial Assessment  Patient Complaints Anxiety  Eye Contact Fair  Facial Expression Animated  Affect Appropriate to circumstance  Speech Logical/coherent  Interaction Assertive  Motor Activity Slow  Appearance/Hygiene Unremarkable  Behavior Characteristics Cooperative;Appropriate to situation  Mood Pleasant  Thought Process  Coherency WDL  Content WDL  Delusions None reported or observed  Perception WDL  Hallucination None reported or observed  Judgment Poor  Confusion None  Danger to Self  Current suicidal ideation? Denies  Agreement Not to Harm Self Yes  Description of Agreement verbal  Danger to Others  Danger to Others None reported or observed

## 2023-03-08 NOTE — Group Note (Signed)
Date:  03/08/2023 Time:  5:06 PM  Group Topic/Focus:  Intellectual Wellness    Participation Level:  Did Not Attend  Participation Quality:   n/a  Affect:   n/a  Cognitive:   n/a  Insight: None  Engagement in Group:   n/a  Modes of Intervention:   n/a  Additional Comments:   Pt did not attend.  Marie Atkins 03/08/2023, 5:06 PM

## 2023-03-08 NOTE — Group Note (Signed)
Date:  03/08/2023 Time:  5:21 PM  Group Topic/Focus:  Emotional Education:   The focus of this group is to discuss what feelings/emotions are, and how they are experienced.    Participation Level:  Did Not Attend  Participation Quality:   n/a  Affect:   n/a  Cognitive:   n/a  Insight: None  Engagement in Group:   n/a  Modes of Intervention:   n/a  Additional Comments:   Pt did not attend.  Edmund Hilda Osamah Schmader 03/08/2023, 5:21 PM

## 2023-03-08 NOTE — Group Note (Signed)
Date:  03/08/2023 Time:  10:28 AM  Group Topic/Focus:  Goals Group:   The focus of this group is to help patients establish daily goals to achieve during treatment and discuss how the patient can incorporate goal setting into their daily lives to aide in recovery. Orientation:   The focus of this group is to educate the patient on the purpose and policies of crisis stabilization and provide a format to answer questions about their admission.  The group details unit policies and expectations of patients while admitted.    Participation Level:  Active  Participation Quality:  Appropriate  Affect:  Appropriate  Cognitive:  Appropriate  Insight: Appropriate  Engagement in Group:  Engaged  Modes of Intervention:  Discussion, Orientation, and Rapport Building  Additional Comments:   Pt attended and participated in the Orientation/Goals group.  Marie Atkins 03/08/2023, 10:28 AM  

## 2023-03-08 NOTE — Group Note (Deleted)
Date:  03/08/2023 Time:  6:36 PM  Group Topic/Focus:  Emotional Wellness     Participation Level:  {BHH PARTICIPATION LEVEL:22264}  Participation Quality:  {BHH PARTICIPATION QUALITY:22265}  Affect:  {BHH AFFECT:22266}  Cognitive:  {BHH COGNITIVE:22267}  Insight: {BHH Insight2:20797}  Engagement in Group:  {BHH ENGAGEMENT IN GROUP:22268}  Modes of Intervention:  {BHH MODES OF INTERVENTION:22269}  Additional Comments:  ***  Alecxander Mainwaring M Ysabela Keisler 03/08/2023, 6:36 PM  

## 2023-03-08 NOTE — Group Note (Signed)
Date:  03/08/2023 Time:  6:26 PM  Group Topic/Focus:  Social Wellness (Self and Interpersonal)    Participation Level:  Did Not Attend  Participation Quality:   n/a  Affect:   n/a  Cognitive:   n/a  Insight: None  Engagement in Group:   n/a  Modes of Intervention:   n/a  Additional Comments:   Pt did not attend.  Edmund Hilda Rhylee Nunn 03/08/2023, 6:26 PM

## 2023-03-08 NOTE — Progress Notes (Signed)
   03/08/23 2200  Psych Admission Type (Psych Patients Only)  Admission Status Voluntary  Psychosocial Assessment  Patient Complaints Anxiety  Eye Contact Fair  Facial Expression Animated  Affect Appropriate to circumstance  Speech Logical/coherent  Interaction Assertive  Motor Activity Slow  Appearance/Hygiene Unremarkable  Behavior Characteristics Cooperative  Mood Pleasant  Thought Process  Coherency WDL  Content WDL  Delusions None reported or observed  Perception WDL  Hallucination None reported or observed  Judgment Poor  Confusion None  Danger to Self  Current suicidal ideation? Denies  Agreement Not to Harm Self Yes  Description of Agreement verbal  Danger to Others  Danger to Others None reported or observed

## 2023-03-09 DIAGNOSIS — F333 Major depressive disorder, recurrent, severe with psychotic symptoms: Secondary | ICD-10-CM | POA: Diagnosis not present

## 2023-03-09 MED ORDER — NICOTINE 14 MG/24HR TD PT24: 14.0000 mg | MEDICATED_PATCH | Freq: Every day | TRANSDERMAL | 0 refills | Status: DC

## 2023-03-09 MED ORDER — MELATONIN 3 MG PO TABS: 3.0000 mg | ORAL_TABLET | Freq: Every day | ORAL | 0 refills | Status: DC

## 2023-03-09 MED ORDER — GABAPENTIN 300 MG PO CAPS: 300.0000 mg | ORAL_CAPSULE | Freq: Three times a day (TID) | ORAL | 0 refills | Status: DC

## 2023-03-09 MED ORDER — LEVOTHYROXINE SODIUM 50 MCG PO TABS: 50.0000 ug | ORAL_TABLET | Freq: Every day | ORAL | 0 refills | Status: DC

## 2023-03-09 MED ORDER — SUCRALFATE 1 G PO TABS: 1.0000 g | ORAL_TABLET | Freq: Three times a day (TID) | ORAL | 0 refills | Status: DC

## 2023-03-09 MED ORDER — VITAMIN D (ERGOCALCIFEROL) 1.25 MG (50000 UNIT) PO CAPS: 50000.0000 [IU] | ORAL_CAPSULE | ORAL | 0 refills | Status: DC

## 2023-03-09 MED ORDER — PANTOPRAZOLE SODIUM 40 MG PO TBEC: 40.0000 mg | DELAYED_RELEASE_TABLET | Freq: Every day | ORAL | 0 refills | Status: DC

## 2023-03-09 MED ORDER — LOSARTAN POTASSIUM 50 MG PO TABS: 50.0000 mg | ORAL_TABLET | Freq: Every day | ORAL | 0 refills | Status: DC

## 2023-03-09 MED ORDER — FLUOXETINE HCL 10 MG PO CAPS: 30.0000 mg | ORAL_CAPSULE | Freq: Every day | ORAL | 0 refills | Status: DC

## 2023-03-09 MED ORDER — RISPERIDONE 1 MG PO TABS: 1.0000 mg | ORAL_TABLET | Freq: Two times a day (BID) | ORAL | 0 refills | Status: DC

## 2023-03-09 NOTE — Progress Notes (Signed)
Patient is discharging at this time. Patient is A&Ox4. Vs stable. Patient denies SI,HI, and A/V/H with no plan/intent. Printed AVS reviewed with and given to patient along with medications and follow up appointments. Suicide safety plan sheet complete and copy provided to patient. Original form placed in chart. Patient verbalized all understanding. All valuables/belongings returned to patient. Patient is being transported by her significant other. Patient denies any pain/discomfort. No s/s of current distress.

## 2023-03-09 NOTE — Discharge Summary (Signed)
Physician Discharge Summary Note  Patient:  Marie Atkins is an 52 y.o., female MRN:  [id number] DOB:  03-04-1971 Patient phone:  [phone] (home)  Patient address:   [address] [place] [address],  Total Time spent with patient: 45 minutes  Date of Admission:  03/02/2023 Date of Discharge: 03/09/2023  Reason for Admission:  Marie Atkins is a 53 yo AAF with prior mental health diagnoses of cocaine use d/o & mdd who was transported to the [facility] by [facility] after bystanders found her down and called 911. While at the ER, pt reported suicidal ideations, stated that she had used a razor to cut her left wrist & used cocaine prior to being found. She reported worsening depressive symptoms in the context of multiple psychosocial stressors, and was transferred voluntarily to this [facility] for treatment and stabilization of her mental status.   Principal Problem: MDD (major depressive disorder), recurrent, severe, with psychosis (HCC) Discharge Diagnoses: Principal Problem:   MDD (major depressive disorder), recurrent, severe, with psychosis (HCC) Active Problems:   Alcohol use disorder   Cocaine use disorder (HCC)   Tobacco use disorder  Past Psychiatric History: See H & P  Past Medical History:  Past Medical History:  Diagnosis Date   Cocaine abuse (HCC)    Depression    HTN (hypertension)    Iron deficiency anemia     Past Surgical History:  Procedure Laterality Date   TUBAL LIGATION     Family History:  Family History  Problem Relation Age of Onset   Hypertension Mother    Diabetes Mother    CVA Mother    Heart attack Mother    Colon cancer Maternal Uncle    Family Psychiatric  History: See H & P Social History:  Social History   Substance and Sexual Activity  Alcohol Use Yes   Comment: "sometimes every other day," "1 to 2 cans of beer or a glass or two of wine"     Social History   Substance and Sexual Activity  Drug Use Yes   Types:  Cocaine   Comment: 3 to 4 times a week, last use on Thursday (06/22/2020)    Social History   Socioeconomic History   Marital status: Single    Spouse name: Not on file   Number of children: Not on file   Years of education: Not on file   Highest education level: Not on file  Occupational History   Not on file  Tobacco Use   Smoking status: Every Day    Types: Cigarettes   Smokeless tobacco: Never   Tobacco comments:    10 cigarettes/day  Vaping Use   Vaping Use: Never used  Substance and Sexual Activity   Alcohol use: Yes    Comment: "sometimes every other day," "1 to 2 cans of beer or a glass or two of wine"   Drug use: Yes    Types: Cocaine    Comment: 3 to 4 times a week, last use on Thursday (06/22/2020)   Sexual activity: Not Currently  Other Topics Concern   Not on file  Social History Narrative   Not on file   Social Determinants of Health   Financial Resource Strain: Not on file  Food Insecurity: No Food Insecurity (03/02/2023)   Hunger Vital Sign    Worried About Running Out of Food in the Last Year: Never true    Ran Out of Food in the Last Year: Never  true  Transportation Needs: No Transportation Needs (03/02/2023)   PRAPARE - Administrator, Civil Service (Medical): No    Lack of Transportation (Non-Medical): No  Physical Activity: Not on file  Stress: Not on file  Social Connections: Not on file   Hospital Course:   During the patient's hospitalization, patient had extensive initial psychiatric evaluation, and follow-up psychiatric evaluations every day. Psychiatric diagnoses provided upon initial assessment are as listed above. Patient's psychiatric medications were adjusted on admission as follows: MDD -Start Prozac 10 mg on 4/23 & increase to 20 mg on 4/24  GERD -Start Sucralfate 1 mg QID (home med) -Start Protonix 40 mg (home med)  Hypertension -Start Losartan 25 mg daily (home dose is 50 mg, but has been non compliant. Starting at a  lower dose and may titrate back up) -Start Clonidine for SBP>160 or DBP>100  Anxiety/CIWA < or =10 -Continue Hydroxyzine 25 mg every 6 hours PRN  CIWA > 10 -Continue Ativan 1mg  tabs every 6 hours PRN     During the hospitalization, other adjustments were made to the patient's psychiatric medication regimen. Medications at discharge are as follows:   -Continue Risperdal 1 mg BID for psychosis/mood stabilization -Continue Prozac to 30 mg daily  -Continue Sucralfate 1 mg QID (home med) -Continue  Protonix 40 mg (home med) -Continue Losartan 50 mg daily  -Continue Synthroid 50 mcg daily -Continue Vitamin D 50.000 units daily    Patient's care was discussed during the interdisciplinary team meeting every day during the hospitalization. The patient denies having side effects to prescribed psychiatric medication. Gradually, patient started adjusting to milieu. The patient was evaluated each day by a clinical provider to ascertain response to treatment. Improvement was noted by the patient's report of decreasing symptoms, improved sleep and appetite, affect, medication tolerance, behavior, and participation in unit programming.  Patient was asked each day to complete a self inventory noting mood, mental status, pain, new symptoms, anxiety and concerns.     Symptoms were reported as significantly decreased or resolved completely by discharge. On day of discharge, the patient reports that their mood is stable. The patient denied having suicidal thoughts for more than 48 hours prior to discharge.  Patient denies having homicidal thoughts.  Patient denies having auditory hallucinations.  Patient denies any visual hallucinations or other symptoms of psychosis. The patient was motivated to continue taking medication with a goal of continued improvement in mental health.    The patient reports their target psychiatric symptoms of depression, anxiety, insomnia and substance cravings responded well to the  psychiatric medications, and the patient reports overall benefit from this psychiatric hospitalization. Supportive psychotherapy was provided to the patient. The patient also participated in regular group therapy while hospitalized. Coping skills, problem solving as well as relaxation therapies were also part of the unit programming.   Patient has been accepted into [facility] rehab center with an admission date of 03/11/2023, and has been educated about the risk of relapse if a door to door between [facility] and [facility] is not done on day of discharge. She is however, adamant that she would like to be discharged today (4/28), and states that she needs to go get supplies from her house and go prep for a medical procedure (which she will not reveal), prior to goilng to [facility]. She states that the procedure is ion about a week, and that [facility] typically provides transportation. She is verbally contracting for safety outside of [facility], and states that she will be  going to [facility] on Tuesday, 4/30, and will restraint from using substances prior to that. She states that her family will be providing her with the transportation to go there. Writer asked to speak with family member who will be picking pt up and she states that the family member is currently at church. She is denying SI/HI/AVH, denies paranoia and there is no sign of delusional thinking. Overall presentation is significantly much better as per objective assessment, when compared to presentation at admission.    Labs were reviewed with the patient, and abnormal results were discussed with the patient. Pt educated on her hypothyroidism diagnosis, educated to continue taking Synthroid after discharge, and to follow up with her PCP after discharge for continuity of care.   The patient is able to verbalize their individual safety plan to this provider.   # It is recommended to the patient to continue psychiatric medications as prescribed, after discharge from the  hospital.     # It is recommended to the patient to follow up with your outpatient psychiatric provider and PCP.   # It was discussed with the patient, the impact of alcohol, drugs, tobacco have been there overall psychiatric and medical wellbeing, and total abstinence from substance use was recommended the patient.ed.   # Prescriptions provided or sent directly to preferred pharmacy at discharge. Patient agreeable to plan. Given opportunity to ask questions. Appears to feel comfortable with discharge.    # In the event of worsening symptoms, the patient is instructed to call the crisis hotline, 911 and or go to the nearest ED for appropriate evaluation and treatment of symptoms. To follow-up with primary care provider for other medical issues, concerns and or health care needs   # Patient was discharged with a plan to follow up as noted below.    Total Time spent with patient: 45 minutes   Physical Findings: AIMS: 0 CIWA:  CIWA-Ar Total: 0 COWS:  n/a  Musculoskeletal: Strength & Muscle Tone: within normal limits Gait & Station: normal Patient leans: N/A  Psychiatric Specialty Exam:  Presentation  General Appearance:  Appropriate for Environment; Fairly Groomed  Eye Contact: Good  Speech: Clear and Coherent  Speech Volume: Normal  Handedness: Right   Mood and Affect  Mood: Euthymic  Affect: Congruent   Thought Process  Thought Processes: Coherent  Descriptions of Associations:Intact  Orientation:Full (Time, Place and Person)  Thought Content:Logical  History of Schizophrenia/Schizoaffective disorder:No  Duration of Psychotic Symptoms:Greater than six months  Hallucinations:Hallucinations: None  Ideas of Reference:None  Suicidal Thoughts:Suicidal Thoughts: No  Homicidal Thoughts:Homicidal Thoughts: No   Sensorium  Memory: Immediate Good  Judgment: Fair  Insight: Fair   Art therapist  Concentration: Good  Attention  Span: Good  Recall: Good  Fund of Knowledge: Good  Language: Good   Psychomotor Activity  Psychomotor Activity: Psychomotor Activity: Normal   Assets  Assets: Communication Skills; Resilience   Sleep  Sleep: Sleep: Good    Physical Exam: Physical Exam Review of Systems  Constitutional:  Negative for fever.  HENT:  Negative for hearing loss.   Eyes:  Negative for blurred vision.  Respiratory:  Negative for cough.   Cardiovascular:  Negative for chest pain.  Gastrointestinal:  Negative for heartburn.  Genitourinary:  Negative for dysuria.  Musculoskeletal:  Negative for myalgias.  Skin:  Negative for rash.  Neurological:  Negative for dizziness.  Psychiatric/Behavioral:  Positive for depression (resolving) and substance abuse (Educated on cessation and on the negative impacts of use on  her mental health). Negative for hallucinations, memory loss and suicidal ideas. The patient is nervous/anxious (resolving) and has insomnia (resolving).    Blood pressure 138/84, pulse 73, temperature (!) 97.5 F (36.4 C), temperature source Oral, resp. rate 14, height 5\' 4"  (1.626 m), weight 68.8 kg, SpO2 100 %. Body mass index is 26.02 kg/m.   Social History   Tobacco Use  Smoking Status Every Day   Types: Cigarettes  Smokeless Tobacco Never  Tobacco Comments   10 cigarettes/day   Tobacco Cessation:  Provided with nicotine patch prescription prior to discharge.  Blood Alcohol level:  Lab Results  Component Value Date   ETH <10 06/23/2020   ETH <10 06/15/2020    Metabolic Disorder Labs:  Lab Results  Component Value Date   HGBA1C 4.8 03/05/2023   MPG 91 03/05/2023   MPG 100 06/24/2020   No results found for: "PROLACTIN" Lab Results  Component Value Date   CHOL 186 03/05/2023   TRIG 79 03/05/2023   HDL 72 03/05/2023   CHOLHDL 2.6 03/05/2023   VLDL 16 03/05/2023   LDLCALC 98 03/05/2023   LDLCALC 80 06/24/2020   See Psychiatric Specialty Exam and Suicide  Risk Assessment completed by Attending Physician prior to discharge.  Discharge destination:  Home  Is patient on multiple antipsychotic therapies at discharge:  No   Has Patient had three or more failed trials of antipsychotic monotherapy by history:  No  Recommended Plan for Multiple Antipsychotic Therapies: NA  Allergies as of 03/09/2023       Reactions   Ace Inhibitors Other (See Comments)   Other reaction(s): Cough (ALLERGY/intolerance)   Lisinopril Cough        Medication List     STOP taking these medications    amLODipine 5 MG tablet Commonly known as: NORVASC   dicyclomine 20 MG tablet Commonly known as: BENTYL   ferrous sulfate 325 (65 FE) MG EC tablet   metroNIDAZOLE 0.75 % vaginal gel Commonly known as: METROGEL   multivitamin with minerals tablet   Mylanta Maximum Strength 400-400-40 MG/5ML suspension Generic drug: alum & mag hydroxide-simeth       TAKE these medications      Indication  FLUoxetine 10 MG capsule Commonly known as: PROZAC Take 3 capsules (30 mg total) by mouth daily. Start taking on: March 10, 2023  Indication: Major Depressive Disorder   gabapentin 300 MG capsule Commonly known as: NEURONTIN Take 1 capsule (300 mg total) by mouth 3 (three) times daily.  Indication: Anxiety   levothyroxine 50 MCG tablet Commonly known as: SYNTHROID Take 1 tablet (50 mcg total) by mouth daily at 6 (six) AM. Start taking on: March 10, 2023  Indication: Underactive Thyroid   losartan 50 MG tablet Commonly known as: COZAAR Take 1 tablet (50 mg total) by mouth daily. Start taking on: March 10, 2023  Indication: High Blood Pressure Disorder   melatonin 3 MG Tabs tablet Take 1 tablet (3 mg total) by mouth at bedtime.  Indication: Trouble Sleeping   nicotine 14 mg/24hr patch Commonly known as: NICODERM CQ - dosed in mg/24 hours Place 1 patch (14 mg total) onto the skin daily. Start taking on: March 10, 2023  Indication: Nicotine  Addiction   pantoprazole 40 MG tablet Commonly known as: Protonix Take 1 tablet (40 mg total) by mouth daily before breakfast.  Indication: Gastroesophageal Reflux Disease   risperiDONE 1 MG tablet Commonly known as: RISPERDAL Take 1 tablet (1 mg total) by mouth 2 (two) times  daily.  Indication: mood stabilization   sucralfate 1 g tablet Commonly known as: Carafate Take 1 tablet (1 g total) by mouth 4 (four) times daily -  with meals and at bedtime. What changed: additional instructions  Indication: Gastroesophageal Reflux Disease   Vitamin D (Ergocalciferol) 1.25 MG (50000 UNIT) Caps capsule Commonly known as: DRISDOL Take 1 capsule (50,000 Units total) by mouth every 7 (seven) days. Start taking on: Mar 12, 2023  Indication: Vitamin D Deficiency        Follow-up Information     [facility] Follow up on 03/14/2023.   Why: You are scheduled for a virtual appointment on May 3rd at 11:30am. [name] will call you at [phone]. Contact information: [address]  [address] [place] [address] [phone]         Services, [facility]. Go on 03/11/2023.   Why: please arrive before 9am Contact information: [address] [place] [address] [phone]                Signed: Starleen Blue, NP 03/09/2023, 11:16 AM

## 2023-03-09 NOTE — [facility] Suicide Risk Assessment (Signed)
[facility] INPATIENT:  Family/Significant Other Suicide Prevention Education  Suicide Prevention Education:  Education Completed; [name],  (boyfriend) has been identified by the patient as the family member/significant other with whom the patient will be residing, and identified as the person(s) who will aid the patient in the event of a mental health crisis (suicidal ideations/suicide attempt).  With written consent from the patient, the family member/significant other has been provided the following suicide prevention education, prior to the and/or following the discharge of the patient.  The suicide prevention education provided includes the following: Suicide risk factors Suicide prevention and interventions National Suicide Hotline telephone number [facility] assessment telephone number [facility] Emergency Assistance 911 [facility] and/or Residential Mobile Crisis Unit telephone number  Request made of family/significant other to: Remove weapons (e.g., guns, rifles, knives), all items previously/currently identified as safety concern.   Remove drugs/medications (over-the-counter, prescriptions, illicit drugs), all items previously/currently identified as a safety concern.  The family member/significant other verbalizes understanding of the suicide prevention education information provided.  The family member/significant other agrees to remove the items of safety concern listed above.  Marie Atkins 03/09/2023, 9:42 AM

## 2023-03-09 NOTE — [facility] Group Notes (Signed)
[facility] Group Notes:  (Nursing/MHT/Case Management/Adjunct)  Date:  03/09/2023  Time:  9:28 AM  Type of Therapy:   Goals group  Participation Level:  Active  Participation Quality:  Appropriate  Affect:  Appropriate  Cognitive:  Appropriate  Insight:  Appropriate  Engagement in Group:  Engaged  Modes of Intervention:  Discussion and Orientation  Summary of Progress/Problems: Goal is to talk with family and friends  Azalee Course 03/09/2023, 9:28 AM

## 2023-03-09 NOTE — Progress Notes (Signed)
  [facility] Adult Case Management Discharge Plan :  Will you be returning to the same living situation after discharge:  Yes,  with her boyfriend Marie Atkins At discharge, do you have transportation home?: Yes,  boyfriend Marie Atkins will transport Do you have the ability to pay for your medications: Yes,  has insurance  Release of information consent forms completed and in the chart;  Patient's signature needed at discharge.  Patient to Follow up at:  Follow-up Information     [facility] Follow up on 03/14/2023.   Why: You are scheduled for a virtual appointment on May 3rd at 11:30am. Marie Atkins will call you at [phone]. Contact information: [address]  [address] [place] [address] [phone]         Services, [facility]. Go on 03/11/2023.   Why: please arrive before 9am Contact information: [address] [place] [address] [phone]                 Next level of care provider has access to [facility] Link:no  Safety Planning and Suicide Prevention discussed: Yes,  Marie Atkins, boyfriend     Has patient been referred to the Quitline?: Patient refused referral  Patient has been referred for addiction treatment: Pt. refused referral  Marie Elk, LCSW 03/09/2023, 9:41 AM
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
## 2023-03-10 ENCOUNTER — Telehealth: Payer: Self-pay | Admitting: Gastroenterology

## 2023-03-10 NOTE — Telephone Encounter (Signed)
Pt was not able to do the dietary restrictions for the colonoscopy due to being inpt at behavioral health. She wants to know if she can still proceed with just the EGD. Please advise.

## 2023-03-10 NOTE — Telephone Encounter (Signed)
Patient called to return phone. Informed her she would be able to start prep tomorrow and still be okay for colon. She is requesting a call back to discuss this since she did not follow the dietary restrictions as well and is asking even though she did not do this and could start the prep tomorrow would she still be okay proceeding with colon. Please call her on [phone], okay to leave detailed messages. Thank you.

## 2023-03-10 NOTE — Telephone Encounter (Signed)
Spoke with patient and she is aware that even though she did not follow the dietary restrictions she is ok to proceed with the procedures as long as she follows her instructions.Pt verbalized understanding.

## 2023-03-10 NOTE — Telephone Encounter (Signed)
Patient is calling states she just got discharged from [facility] and was not able to prep for colonoscopy on 5/1 but is wondering if she can still have her endoscopy done. Please advise

## 2023-03-10 NOTE — Telephone Encounter (Signed)
Left message for pt to call back. Per Dr. Tomasa Rand she can start the prep for colon tomorrow and still be fine to have endo and colon done.  Attempted to call pt back and voice mailbox is full.

## 2023-03-12 ENCOUNTER — Encounter: Payer: Self-pay | Admitting: Gastroenterology

## 2023-03-12 ENCOUNTER — Ambulatory Visit (AMBULATORY_SURGERY_CENTER): Payer: Medicaid Other | Admitting: Gastroenterology

## 2023-03-12 VITALS — BP 151/87 | HR 80 | Temp 98.0°F | Resp 22 | Ht 64.0 in | Wt 146.0 lb

## 2023-03-12 DIAGNOSIS — B9681 Helicobacter pylori [H. pylori] as the cause of diseases classified elsewhere: Secondary | ICD-10-CM | POA: Diagnosis not present

## 2023-03-12 DIAGNOSIS — K2940 Chronic atrophic gastritis without bleeding: Secondary | ICD-10-CM

## 2023-03-12 DIAGNOSIS — K2950 Unspecified chronic gastritis without bleeding: Secondary | ICD-10-CM | POA: Diagnosis not present

## 2023-03-12 DIAGNOSIS — D649 Anemia, unspecified: Secondary | ICD-10-CM

## 2023-03-12 DIAGNOSIS — R1013 Epigastric pain: Secondary | ICD-10-CM | POA: Diagnosis not present

## 2023-03-12 DIAGNOSIS — K2970 Gastritis, unspecified, without bleeding: Secondary | ICD-10-CM

## 2023-03-12 DIAGNOSIS — R634 Abnormal weight loss: Secondary | ICD-10-CM

## 2023-03-12 MED ORDER — SODIUM CHLORIDE 0.9 % IV SOLN
500.0000 mL | Freq: Once | INTRAVENOUS | Status: DC
Start: 2023-03-12 — End: 2023-03-12

## 2023-03-12 NOTE — Progress Notes (Signed)
[facility] History and Physical   Primary Care Physician:  Pcp, No   Reason for Procedure:   Abdominal pain, nausea/vomiting, weight loss, anemia  Plan:    EGD/colonoscopy     HPI: Marie Atkins is a 52 y.o. female undergoing EGD and colonoscopy to evaluate symptoms of chronic abdominal pain, nausea/vomiting, unintentional weight loss and iron deficiency anemia.  Her symptoms have improved somewhat in the past week or so.  She has heavy menstrual bleeding and suspected uterine fibroids on CT which is a likely source of her iron deficiency anemia.  Past Medical History:  Diagnosis Date   Cocaine abuse (HCC)    Depression    HTN (hypertension)    Iron deficiency anemia     Past Surgical History:  Procedure Laterality Date   TUBAL LIGATION      Prior to Admission medications   Medication Sig Start Date End Date Taking? Authorizing Provider  losartan (COZAAR) 50 MG tablet Take 1 tablet (50 mg total) by mouth daily. 03/10/23  Yes Nkwenti, Doris, NP  melatonin 3 MG TABS tablet Take 1 tablet (3 mg total) by mouth at bedtime. 03/09/23  Yes Starleen Blue, NP  FLUoxetine (PROZAC) 10 MG capsule Take 3 capsules (30 mg total) by mouth daily. 03/10/23   Starleen Blue, NP  gabapentin (NEURONTIN) 300 MG capsule Take 1 capsule (300 mg total) by mouth 3 (three) times daily. 03/09/23   Starleen Blue, NP  levothyroxine (SYNTHROID) 50 MCG tablet Take 1 tablet (50 mcg total) by mouth daily at 6 (six) AM. 03/10/23   Starleen Blue, NP  nicotine (NICODERM CQ - DOSED IN MG/24 HOURS) 14 mg/24hr patch Place 1 patch (14 mg total) onto the skin daily. Patient not taking: Reported on 03/12/2023 03/10/23   Starleen Blue, NP  pantoprazole (PROTONIX) 40 MG tablet Take 1 tablet (40 mg total) by mouth daily before breakfast. 03/09/23 04/08/23  Starleen Blue, NP  risperiDONE (RISPERDAL) 1 MG tablet Take 1 tablet (1 mg total) by mouth 2 (two) times daily. 03/09/23   Starleen Blue, NP  sucralfate (CARAFATE)  1 g tablet Take 1 tablet (1 g total) by mouth 4 (four) times daily -  with meals and at bedtime. 03/09/23   Starleen Blue, NP  Vitamin D, Ergocalciferol, (DRISDOL) 1.25 MG (50000 UNIT) CAPS capsule Take 1 capsule (50,000 Units total) by mouth every 7 (seven) days. 03/12/23   Starleen Blue, NP    Current Outpatient Medications  Medication Sig Dispense Refill   losartan (COZAAR) 50 MG tablet Take 1 tablet (50 mg total) by mouth daily. 30 tablet 0   melatonin 3 MG TABS tablet Take 1 tablet (3 mg total) by mouth at bedtime. 30 tablet 0   FLUoxetine (PROZAC) 10 MG capsule Take 3 capsules (30 mg total) by mouth daily. 90 capsule 0   gabapentin (NEURONTIN) 300 MG capsule Take 1 capsule (300 mg total) by mouth 3 (three) times daily. 90 capsule 0   levothyroxine (SYNTHROID) 50 MCG tablet Take 1 tablet (50 mcg total) by mouth daily at 6 (six) AM. 30 tablet 0   nicotine (NICODERM CQ - DOSED IN MG/24 HOURS) 14 mg/24hr patch Place 1 patch (14 mg total) onto the skin daily. (Patient not taking: Reported on 03/12/2023) 28 patch 0   pantoprazole (PROTONIX) 40 MG tablet Take 1 tablet (40 mg total) by mouth daily before breakfast. 30 tablet 0   risperiDONE (RISPERDAL) 1 MG tablet Take 1 tablet (1 mg total) by mouth 2 (two) times daily.  60 tablet 0   sucralfate (CARAFATE) 1 g tablet Take 1 tablet (1 g total) by mouth 4 (four) times daily -  with meals and at bedtime. 120 tablet 0   Vitamin D, Ergocalciferol, (DRISDOL) 1.25 MG (50000 UNIT) CAPS capsule Take 1 capsule (50,000 Units total) by mouth every 7 (seven) days. 5 capsule 0   Current Facility-Administered Medications  Medication Dose Route Frequency Provider Last Rate Last Admin   0.9 %  sodium chloride infusion  500 mL Intravenous Once Jenel Lucks, MD        Allergies as of 03/12/2023 - Review Complete 03/12/2023  Allergen Reaction Noted   Ace inhibitors Other (See Comments) 09/23/2014   Lisinopril Cough 09/23/2014    Family History  Problem  Relation Age of Onset   Hypertension Mother    Diabetes Mother    CVA Mother    Heart attack Mother    Colon cancer Maternal Uncle     Social History   Socioeconomic History   Marital status: Single    Spouse name: Not on file   Number of children: Not on file   Years of education: Not on file   Highest education level: Not on file  Occupational History   Not on file  Tobacco Use   Smoking status: Every Day    Packs/day: 0.25    Years: 36.00    Additional pack years: 0.00    Total pack years: 9.00    Types: Cigarettes   Smokeless tobacco: Never   Tobacco comments:    10 cigarettes/day  Vaping Use   Vaping Use: Never used  Substance and Sexual Activity   Alcohol use: Yes    Alcohol/week: 1.0 standard drink of alcohol    Types: 1 Cans of beer per week    Comment: "sometimes every other day," "1 to 2 cans of beer or a glass or two of wine"   Drug use: Yes    Types: Cocaine    Comment: 3 to 4 times a week, last use on friday 02/28/23   Sexual activity: Not Currently  Other Topics Concern   Not on file  Social History Narrative   Not on file   Social Determinants of Health   Financial Resource Strain: Not on file  Food Insecurity: No Food Insecurity (03/02/2023)   Hunger Vital Sign    Worried About Running Out of Food in the Last Year: Never true    Ran Out of Food in the Last Year: Never true  Transportation Needs: No Transportation Needs (03/02/2023)   PRAPARE - Administrator, Civil Service (Medical): No    Lack of Transportation (Non-Medical): No  Physical Activity: Not on file  Stress: Not on file  Social Connections: Not on file  Intimate Partner Violence: Not At Risk (03/02/2023)   Humiliation, Afraid, Rape, and Kick questionnaire    Fear of Current or Ex-Partner: No    Emotionally Abused: No    Physically Abused: No    Sexually Abused: No    Review of Systems:  All other review of systems negative except as mentioned in the  HPI.  Physical Exam: Vital signs BP (!) 148/86   Pulse 80   Temp 98 F (36.7 C)   Ht 5\' 4"  (1.626 m)   Wt 146 lb (66.2 kg)   LMP 03/12/2023 Comment: on menstrual period now  SpO2 100%   BMI 25.06 kg/m   General:   Alert,  Well-developed, well-nourished, pleasant and  cooperative in NAD Airway:  Mallampati 2 Lungs:  Clear throughout to auscultation.   Heart:  Regular rate and rhythm; no murmurs, clicks, rubs,  or gallops. Abdomen:  Soft, nontender and nondistended. Normal bowel sounds.   Neuro/Psych:  Normal mood and affect. A and O x 3   Lesia Monica E. Candis Schatz, MD [facility]

## 2023-03-12 NOTE — Op Note (Signed)
[facility] Patient Name: Marie Atkins Procedure Date: 03/12/2023 2:34 PM MRN: [id number] Endoscopist: Lorin Picket E. Tomasa Rand , MD, [id number] Age: 52 Referring MD:  Date of Birth: 1971-08-30 Gender: Female Account #: [id number] Procedure:                Colonoscopy Indications:              Epigastric abdominal pain, Iron deficiency anemia,                            Weight loss Medicines:                Monitored Anesthesia Care Procedure:                Pre-Anesthesia Assessment:                           - Prior to the procedure, a History and Physical                            was performed, and patient medications and                            allergies were reviewed. The patient's tolerance of                            previous anesthesia was also reviewed. The risks                            and benefits of the procedure and the sedation                            options and risks were discussed with the patient.                            All questions were answered, and informed consent                            was obtained. Prior Anticoagulants: The patient has                            taken no anticoagulant or antiplatelet agents. ASA                            Grade Assessment: II - A patient with mild systemic                            disease. After reviewing the risks and benefits,                            the patient was deemed in satisfactory condition to                            undergo the procedure.  After obtaining informed consent, the colonoscope                            was passed under direct vision. Throughout the                            procedure, the patient's blood pressure, pulse, and                            oxygen saturations were monitored continuously. The                            Olympus CF-HQ190L [id number]) Colonoscope was                            introduced through the anus and advanced to  the the                            terminal ileum, with identification of the                            appendiceal orifice and IC valve. The colonoscopy                            was performed without difficulty. The patient                            tolerated the procedure well. The quality of the                            bowel preparation was good. The terminal ileum,                            ileocecal valve, appendiceal orifice, and rectum                            were photographed. The bowel preparation used was                            SUPREP via split dose instruction. Scope In: 3:01:00 PM Scope Out: 3:13:47 PM Scope Withdrawal Time: 0 hours 8 minutes 25 seconds  Total Procedure Duration: 0 hours 12 minutes 47 seconds  Findings:                 The perianal and digital rectal examinations were                            normal. Pertinent negatives include normal                            sphincter tone and no palpable rectal lesions.                           A few small-mouthed diverticula were found in the  sigmoid colon and descending colon.                           The exam was otherwise normal throughout the                            examined colon.                           The terminal ileum appeared normal.                           The retroflexed view of the distal rectum and anal                            verge was normal and showed no anal or rectal                            abnormalities. Complications:            No immediate complications. Estimated Blood Loss:     Estimated blood loss: none. Impression:               - Diverticulosis in the sigmoid colon and in the                            descending colon.                           - The examined portion of the ileum was normal.                           - The distal rectum and anal verge are normal on                            retroflexion view.                            - No specimens collected.                           - No abnormalities to explain patient's symptoms or                            anemia.                           - Suspect iron deficiency anemia is secondary to                            menstrual blood loss. Recommendation:           - Patient has a contact number available for                            emergencies. The signs and symptoms of potential  delayed complications were discussed with the                            patient. Return to normal activities tomorrow.                            Written discharge instructions were provided to the                            patient.                           - Resume previous diet.                           - Continue present medications.                           - Repeat colonoscopy in 10 years for screening                            purposes.                           - Follow up with gynecology to regulate menstrual                            blood loss                           - Recommend oral iron replacement                           - Follow up as needed in the clinic if abdominal                            pain/nausea/vomiting return. Jola Critzer E. Tomasa Rand, MD 03/12/2023 3:29:25 PM This report has been signed electronically.

## 2023-03-12 NOTE — Op Note (Signed)
[facility] Patient Name: Marie Atkins Procedure Date: 03/12/2023 2:35 PM MRN: [id number] Endoscopist: Lorin Picket E. Tomasa Rand , MD, [id number] Age: 52 Referring MD:  Date of Birth: 12/17/1970 Gender: Female Account #: [id number] Procedure:                Upper GI endoscopy Indications:              Epigastric abdominal pain, Iron deficiency anemia,                            Nausea with vomiting, Weight loss Medicines:                Monitored Anesthesia Care Procedure:                Pre-Anesthesia Assessment:                           - Prior to the procedure, a History and Physical                            was performed, and patient medications and                            allergies were reviewed. The patient's tolerance of                            previous anesthesia was also reviewed. The risks                            and benefits of the procedure and the sedation                            options and risks were discussed with the patient.                            All questions were answered, and informed consent                            was obtained. Prior Anticoagulants: The patient has                            taken no anticoagulant or antiplatelet agents. ASA                            Grade Assessment: II - A patient with mild systemic                            disease. After reviewing the risks and benefits,                            the patient was deemed in satisfactory condition to                            undergo the procedure.  After obtaining informed consent, the endoscope was                            passed under direct vision. Throughout the                            procedure, the patient's blood pressure, pulse, and                            oxygen saturations were monitored continuously. The                            GIF [id number] #[id number] was introduced through the                            mouth, and  advanced to the second part of duodenum.                            The upper GI endoscopy was accomplished without                            difficulty. The patient tolerated the procedure                            well. Scope In: Scope Out: Findings:                 The examined portions of the nasopharynx,                            oropharynx and larynx were normal, but there was                            blackish discoloration of the right laryngeal fold                           The examined esophagus was normal.                           Diffuse atrophic mucosa was found in the gastric                            antrum. Biopsies were taken with a cold forceps for                            Helicobacter pylori testing. Estimated blood loss                            was minimal.                           The exam of the stomach was otherwise normal.                           The examined duodenum was normal. Biopsies for  histology were taken with a cold forceps for                            evaluation of celiac disease. Biopsies for                            histology were taken with a cold forceps for                            evaluation of celiac disease. Estimated blood loss                            was minimal. Complications:            No immediate complications. Estimated Blood Loss:     Estimated blood loss was minimal. Impression:               - The examined portions of the nasopharynx,                            oropharynx and larynx were normal except for                            discoloration of the larygneal fold, likely                            secondary to smoking                           - Normal esophagus.                           - Gastric mucosal atrophy. Biopsied.                           - Normal examined duodenum. Biopsied.                           - No abnormalities to explain abdominal pain,                             nausea/vomiting or weight loss. Recommendation:           - Patient has a contact number available for                            emergencies. The signs and symptoms of potential                            delayed complications were discussed with the                            patient. Return to normal activities tomorrow.                            Written discharge instructions were provided to the  patient.                           - Resume previous diet.                           - Continue present medications.                           - Await pathology results.                           - Recommend ENT consult to further evaluate                            discolored laryngeal mucosa.                           - Proceed with colonoscopy. Anadia Helmes E. Tomasa Rand, MD 03/12/2023 3:22:59 PM This report has been signed electronically.

## 2023-03-12 NOTE — Progress Notes (Signed)
CRNA ,Azucena Kuba informed of pt used Cocaine last on 02/28/23 .

## 2023-03-12 NOTE — Patient Instructions (Addendum)
- Resume previous diet. - Continue present medications. - Await pathology results. - Recommend ENT consult to further evaluate discolored laryngeal mucosa.   - Repeat colonoscopy in 10 years for screening purposes. - Follow up with gynecology to regulate menstrual blood loss. - Recommend oral iron replacement. - Follow up as needed in the clinic if abdominal pain/nausea/vomiting return.  YOU HAD AN ENDOSCOPIC PROCEDURE TODAY AT THE [facility]:   Refer to the procedure report that was given to you for any specific questions about what was found during the examination.  If the procedure report does not answer your questions, please call your gastroenterologist to clarify.  If you requested that your care partner not be given the details of your procedure findings, then the procedure report has been included in a sealed envelope for you to review at your convenience later.  YOU SHOULD EXPECT: Some feelings of bloating in the abdomen. Passage of more gas than usual.  Walking can help get rid of the air that was put into your GI tract during the procedure and reduce the bloating. If you had a lower endoscopy (such as a colonoscopy or flexible sigmoidoscopy) you may notice spotting of blood in your stool or on the toilet paper. If you underwent a bowel prep for your procedure, you may not have a normal bowel movement for a few days.  Please Note:  You might notice some irritation and congestion in your nose or some drainage.  This is from the oxygen used during your procedure.  There is no need for concern and it should clear up in a day or so.  SYMPTOMS TO REPORT IMMEDIATELY:  Following lower endoscopy (colonoscopy or flexible sigmoidoscopy):  Excessive amounts of blood in the stool  Significant tenderness or worsening of abdominal pains  Swelling of the abdomen that is new, acute  Fever of 100F or higher  Following upper endoscopy (EGD)  Vomiting of blood or coffee ground  material  New chest pain or pain under the shoulder blades  Painful or persistently difficult swallowing  New shortness of breath  Fever of 100F or higher  Black, tarry-looking stools  For urgent or emergent issues, a gastroenterologist can be reached at any hour by calling [phone]. Do not use MyChart messaging for urgent concerns.    DIET:  We do recommend a small meal at first, but then you may proceed to your regular diet.  Drink plenty of fluids but you should avoid alcoholic beverages for 24 hours.  ACTIVITY:  You should plan to take it easy for the rest of today and you should NOT DRIVE or use heavy machinery until tomorrow (because of the sedation medicines used during the test).    FOLLOW UP: Our staff will call the number listed on your records the next business day following your procedure.  We will call around 7:15- 8:00 am to check on you and address any questions or concerns that you may have regarding the information given to you following your procedure. If we do not reach you, we will leave a message.     If any biopsies were taken you will be contacted by phone or by letter within the next 1-3 weeks.  Please call us at [phone] if you have not heard about the biopsies in 3 weeks.    SIGNATURES/CONFIDENTIALITY: You and/or your care partner have signed paperwork which will be entered into your electronic medical record.  These signatures attest to the fact that that  the information above on your After Visit Summary has been reviewed and is understood.  Full responsibility of the confidentiality of this discharge information lies with you and/or your care-partner.

## 2023-03-12 NOTE — Progress Notes (Signed)
Called to room to assist during endoscopic procedure.  Patient ID and intended procedure confirmed with present staff. Received instructions for my participation in the procedure from the performing physician.  

## 2023-03-13 ENCOUNTER — Telehealth: Payer: Self-pay | Admitting: *Deleted

## 2023-03-13 NOTE — Telephone Encounter (Signed)
  Follow up Call-     03/12/2023    2:07 PM  Call back number  Post procedure Call Back phone  # [phone] Reginald's phone  Permission to leave phone message Yes     Patient questions:  Do you have a fever, pain , or abdominal swelling? No. Pain Score  0 *  Have you tolerated food without any problems? Yes.    Have you been able to return to your normal activities? Yes.    Do you have any questions about your discharge instructions: Diet   No. Medications  No. Follow up visit  No.  Do you have questions or concerns about your Care? No.  Actions: * If pain score is 4 or above: No action needed, pain <4.

## 2023-03-19 ENCOUNTER — Other Ambulatory Visit: Payer: Self-pay

## 2023-03-19 DIAGNOSIS — B9681 Helicobacter pylori [H. pylori] as the cause of diseases classified elsewhere: Secondary | ICD-10-CM

## 2023-03-19 MED ORDER — METRONIDAZOLE 250 MG PO TABS: 250.0000 mg | ORAL_TABLET | Freq: Four times a day (QID) | ORAL | 0 refills | Status: DC

## 2023-03-19 MED ORDER — PANTOPRAZOLE SODIUM 40 MG PO TBEC: 40.0000 mg | DELAYED_RELEASE_TABLET | Freq: Two times a day (BID) | ORAL | 0 refills | Status: DC

## 2023-03-19 MED ORDER — DOXYCYCLINE HYCLATE 100 MG PO CAPS: 100.0000 mg | ORAL_CAPSULE | Freq: Two times a day (BID) | ORAL | 0 refills | Status: DC

## 2023-03-19 MED ORDER — BISMUTH 262 MG PO CHEW: 524.0000 mg | CHEWABLE_TABLET | Freq: Four times a day (QID) | ORAL | 0 refills | Status: DC

## 2023-03-19 NOTE — Progress Notes (Signed)
Marie Atkins,  The biopsies from the recent upper GI Endoscopy were notable for H. Pylori gastritis.  This can be a source of chronic abdominal pain and nausea, and can also cause iron deficiency.  We will plan on treating with quadruple therapy as below. Please confirm no medication allergies to the prescribed regimen.   1) Pantoprazole 40 mg 2 times a day x 14 d 2) Pepto Bismol 2 tabs (262 mg each) 4 times a day x 14 d 3) Metronidazole 250 mg 4 times a day x 14 d 4) doxycycline 100 mg 2 times a day x 14 d  After 14 days, ok to stop pantoprazole  4 weeks after treatment completed, check H. Pylori stool antigen to confirm eradication (must be off acid suppression therapy for 2 weeks prior to specimen submission)  Dx: H. Pylori gastritis

## 2023-03-27 DIAGNOSIS — E039 Hypothyroidism, unspecified: Secondary | ICD-10-CM | POA: Insufficient documentation

## 2023-04-16 ENCOUNTER — Other Ambulatory Visit: Payer: Self-pay

## 2023-04-16 ENCOUNTER — Encounter (HOSPITAL_BASED_OUTPATIENT_CLINIC_OR_DEPARTMENT_OTHER): Payer: Self-pay | Admitting: Emergency Medicine

## 2023-04-16 ENCOUNTER — Emergency Department (HOSPITAL_BASED_OUTPATIENT_CLINIC_OR_DEPARTMENT_OTHER)
Admission: EM | Admit: 2023-04-16 | Discharge: 2023-04-17 | Disposition: A | Discharge status: Home / Self Care | Payer: No Typology Code available for payment source | Attending: Emergency Medicine | Admitting: Emergency Medicine

## 2023-04-16 DIAGNOSIS — I10 Essential (primary) hypertension: Secondary | ICD-10-CM | POA: Insufficient documentation

## 2023-04-16 DIAGNOSIS — I160 Hypertensive urgency: Secondary | ICD-10-CM | POA: Insufficient documentation

## 2023-04-16 DIAGNOSIS — Z79899 Other long term (current) drug therapy: Secondary | ICD-10-CM | POA: Insufficient documentation

## 2023-04-16 LAB — COMPREHENSIVE METABOLIC PANEL
ALT: 17 U/L (ref 0–44)
AST: 17 U/L (ref 15–41)
Albumin: 3.3 g/dL — ABNORMAL LOW (ref 3.5–5.0)
Alkaline Phosphatase: 96 U/L (ref 38–126)
Anion gap: 7 (ref 5–15)
BUN: 16 mg/dL (ref 6–20)
CO2: 24 mmol/L (ref 22–32)
Calcium: 8.5 mg/dL — ABNORMAL LOW (ref 8.9–10.3)
Chloride: 106 mmol/L (ref 98–111)
Creatinine, Ser: 0.89 mg/dL (ref 0.44–1.00)
GFR, Estimated: >60 mL/min (ref >60)
Glucose, Bld: 92 mg/dL (ref 70–99)
Potassium: 4.2 mmol/L (ref 3.5–5.1)
Sodium: 137 mmol/L (ref 135–145)
Total Bilirubin: 0.3 mg/dL (ref 0.3–1.2)
Total Protein: 7.3 g/dL (ref 6.5–8.1)

## 2023-04-16 LAB — CBC WITH DIFFERENTIAL/PLATELET
Abs Immature Granulocytes: 0.03 10*3/uL (ref 0.00–0.07)
Basophils Absolute: 0.1 10*3/uL (ref 0.0–0.1)
Basophils Relative: 1 %
Eosinophils Absolute: 0.1 10*3/uL (ref 0.0–0.5)
Eosinophils Relative: 1 %
HCT: 26.1 % — ABNORMAL LOW (ref 36.0–46.0)
Hemoglobin: 8.2 g/dL — ABNORMAL LOW (ref 12.0–15.0)
Immature Granulocytes: 0 %
Lymphocytes Relative: 33 %
Lymphs Abs: 3.2 10*3/uL (ref 0.7–4.0)
MCH: 24 pg — ABNORMAL LOW (ref 26.0–34.0)
MCHC: 31.4 g/dL (ref 30.0–36.0)
MCV: 76.3 fL — ABNORMAL LOW (ref 80.0–100.0)
Monocytes Absolute: 1.2 10*3/uL — ABNORMAL HIGH (ref 0.1–1.0)
Monocytes Relative: 12 %
Neutro Abs: 5.2 10*3/uL (ref 1.7–7.7)
Neutrophils Relative %: 53 %
Platelets: 375 10*3/uL (ref 150–400)
RBC: 3.42 MIL/uL — ABNORMAL LOW (ref 3.87–5.11)
RDW: 20.6 % — ABNORMAL HIGH (ref 11.5–15.5)
WBC: 9.8 10*3/uL (ref 4.0–10.5)
nRBC: 0 % (ref 0.0–0.2)

## 2023-04-16 LAB — TROPONIN I (HIGH SENSITIVITY): Troponin I (High Sensitivity): 5 ng/L (ref <18)

## 2023-04-16 NOTE — ED Triage Notes (Signed)
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

## 2023-04-16 NOTE — ED Provider Notes (Signed)
[place] EMERGENCY DEPARTMENT AT [facility] Provider Note   CSN: [id number] Arrival date & time: 04/16/23  1956     History {Add pertinent medical, surgical, social history, OB history to HPI:1} Chief Complaint  Patient presents with   Hypertension    Marie Atkins is a 52 y.o. female.  HPI     Home Medications Prior to Admission medications   Medication Sig Start Date End Date Taking? Authorizing Provider  Bismuth 262 MG CHEW Chew 524 mg by mouth in the morning, at noon, in the evening, and at bedtime. 03/19/23   Jenel Lucks, MD  doxycycline (VIBRAMYCIN) 100 MG capsule Take 1 capsule (100 mg total) by mouth 2 (two) times daily. 03/19/23   Jenel Lucks, MD  metroNIDAZOLE (FLAGYL) 250 MG tablet Take 1 tablet (250 mg total) by mouth 4 (four) times daily. 03/19/23   Jenel Lucks, MD  pantoprazole (PROTONIX) 40 MG tablet Take 1 tablet (40 mg total) by mouth 2 (two) times daily before a meal. 03/19/23   Jenel Lucks, MD  FLUoxetine (PROZAC) 10 MG capsule Take 3 capsules (30 mg total) by mouth daily. 03/10/23   Starleen Blue, NP  gabapentin (NEURONTIN) 300 MG capsule Take 1 capsule (300 mg total) by mouth 3 (three) times daily. 03/09/23   Starleen Blue, NP  levothyroxine (SYNTHROID) 50 MCG tablet Take 1 tablet (50 mcg total) by mouth daily at 6 (six) AM. 03/10/23   Starleen Blue, NP  losartan (COZAAR) 50 MG tablet Take 1 tablet (50 mg total) by mouth daily. 03/10/23   Starleen Blue, NP  melatonin 3 MG TABS tablet Take 1 tablet (3 mg total) by mouth at bedtime. 03/09/23   Starleen Blue, NP  nicotine (NICODERM CQ - DOSED IN MG/24 HOURS) 14 mg/24hr patch Place 1 patch (14 mg total) onto the skin daily. Patient not taking: Reported on 03/12/2023 03/10/23   Starleen Blue, NP  pantoprazole (PROTONIX) 40 MG tablet Take 1 tablet (40 mg total) by mouth daily before breakfast. 03/09/23 04/08/23  Starleen Blue, NP  risperiDONE (RISPERDAL) 1 MG tablet Take 1 tablet (1  mg total) by mouth 2 (two) times daily. 03/09/23   Starleen Blue, NP  Vitamin D, Ergocalciferol, (DRISDOL) 1.25 MG (50000 UNIT) CAPS capsule Take 1 capsule (50,000 Units total) by mouth every 7 (seven) days. 03/12/23   Starleen Blue, NP      Allergies    Ace inhibitors and Lisinopril    Review of Systems   Review of Systems  Physical Exam Updated Vital Signs BP (!) 190/102 (BP Location: Right Arm)   Pulse 89   Temp 98.3 F (36.8 C) (Oral)   Resp 20   Ht 5\' 4"  (1.626 m)   Wt 75.3 kg   LMP 04/08/2023   SpO2 100%   BMI 28.49 kg/m  Physical Exam  ED Results / Procedures / Treatments   Labs (all labs ordered are listed, but only abnormal results are displayed) Labs Reviewed  CBC WITH DIFFERENTIAL/PLATELET - Abnormal; Notable for the following components:      Result Value   RBC 3.42 (*)    Hemoglobin 8.2 (*)    HCT 26.1 (*)    MCV 76.3 (*)    MCH 24.0 (*)    RDW 20.6 (*)    Monocytes Absolute 1.2 (*)    All other components within normal limits  COMPREHENSIVE METABOLIC PANEL - Abnormal; Notable for the following components:   Calcium 8.5 (*)  Albumin 3.3 (*)    All other components within normal limits  TROPONIN I (HIGH SENSITIVITY)  TROPONIN I (HIGH SENSITIVITY)    EKG EKG Interpretation  Date/Time:  Wednesday April 16 2023 20:26:51 EDT Ventricular Rate:  83 PR Interval:  135 QRS Duration: 90 QT Interval:  394 QTC Calculation: 463 R Axis:   67 Text Interpretation: Sinus rhythm LAE, consider biatrial enlargement correction of anterior T wave inversion seen previously in V2, otherwise similar Confirmed by Arby Barrette [phone]) on 04/16/2023 10:14:08 PM  Radiology No results found.  Procedures Procedures  {Document cardiac monitor, telemetry assessment procedure when appropriate:1}  Medications Ordered in ED Medications - No data to display  ED Course/ Medical Decision Making/ A&P   {   Click here for ABCD2, HEART and other calculatorsREFRESH Note  before signing :1}                          Medical Decision Making Amount and/or Complexity of Data Reviewed Labs: ordered.   ***  {Document critical care time when appropriate:1} {Document review of labs and clinical decision tools ie heart score, Chads2Vasc2 etc:1}  {Document your independent review of radiology images, and any outside records:1} {Document your discussion with family members, caretakers, and with consultants:1} {Document social determinants of health affecting pt's care:1} {Document your decision making why or why not admission, treatments were needed:1} Final Clinical Impression(s) / ED Diagnoses Final diagnoses:  None    Rx / DC Orders ED Discharge Orders     None

## 2023-04-17 ENCOUNTER — Emergency Department (HOSPITAL_COMMUNITY): Payer: No Typology Code available for payment source

## 2023-04-17 ENCOUNTER — Other Ambulatory Visit: Payer: Self-pay

## 2023-04-17 ENCOUNTER — Encounter (HOSPITAL_COMMUNITY): Payer: Self-pay | Admitting: Emergency Medicine

## 2023-04-17 ENCOUNTER — Emergency Department (HOSPITAL_COMMUNITY)
Admission: EM | Admit: 2023-04-17 | Discharge: 2023-04-17 | Disposition: A | Discharge status: Rehabilitation Facility | Payer: No Typology Code available for payment source | Source: Home / Self Care | Attending: Emergency Medicine | Admitting: Emergency Medicine

## 2023-04-17 DIAGNOSIS — I10 Essential (primary) hypertension: Secondary | ICD-10-CM

## 2023-04-17 DIAGNOSIS — Z79899 Other long term (current) drug therapy: Secondary | ICD-10-CM | POA: Insufficient documentation

## 2023-04-17 MED ORDER — LOSARTAN POTASSIUM 100 MG PO TABS: 100.0000 mg | ORAL_TABLET | Freq: Every day | ORAL | 0 refills | Status: DC

## 2023-04-17 NOTE — ED Triage Notes (Signed)
Pt BIB EMS from [facility], receiving treatment r/t drug detox. Reported lightheadedness, and left arm numbness since 1830 on yesterday. BP upon EMS arrival 210/118. Negative stroke screen, alert and oriented x4. Takes Losartan 50mg  daily. Treated at [facility] (took 100 mg Losartan this AM) BP after 30 minutes 170/104  BP 170/104 P 72 RR 18 20 LAC

## 2023-04-17 NOTE — Discharge Instructions (Signed)
Continue taking your losartan.  Keep a blood pressure diary.  Only check your blood pressure once a day.  If you have any concerning symptoms such as vision change, balance issues, chest pain, shortness of breath return to the emergency room.

## 2023-04-17 NOTE — ED Notes (Signed)
Call placed to facility regarding discharge.

## 2023-04-17 NOTE — ED Provider Notes (Signed)
EMERGENCY DEPARTMENT AT [facility] Provider Note   CSN: [id number] Arrival date & time: 04/17/23  1219     History  Chief Complaint  Patient presents with   Hypertension    Marie Atkins is a 52 y.o. female.  Patient presents from [facility] facility for elevated blood pressure.  She was seen yesterday for similar complaints.  She states she is having some lightheadedness, and paresthesias to left upper extremity.  Currently without paresthesias.  Her losartan dose was increased from 50 mg to 100 mg.  Today was her first day taking the increased dose.  No chest pain, shortness of breath, vision change.  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  Reports compliance with her medications.  Had workup done in the emergency department yesterday which was overall reassuring from an ACS standpoint.  The history is provided by the patient. No language interpreter was used.  Hypertension       Home Medications Prior to Admission medications   Medication Sig Start Date End Date Taking? Authorizing Provider  Bismuth 262 MG CHEW Chew 524 mg by mouth in the morning, at noon, in the evening, and at bedtime. 03/19/23   Jenel Lucks, MD  doxycycline (VIBRAMYCIN) 100 MG capsule Take 1 capsule (100 mg total) by mouth 2 (two) times daily. 03/19/23   Jenel Lucks, MD  metroNIDAZOLE (FLAGYL) 250 MG tablet Take 1 tablet (250 mg total) by mouth 4 (four) times daily. 03/19/23   Jenel Lucks, MD  pantoprazole (PROTONIX) 40 MG tablet Take 1 tablet (40 mg total) by mouth 2 (two) times daily before a meal. 03/19/23   Jenel Lucks, MD  FLUoxetine (PROZAC) 10 MG capsule Take 3 capsules (30 mg total) by mouth daily. 03/10/23   Starleen Blue, NP  gabapentin (NEURONTIN) 300 MG capsule Take 1 capsule (300 mg total) by mouth 3 (three) times daily. 03/09/23   Starleen Blue, NP  levothyroxine (SYNTHROID) 50 MCG tablet Take 1 tablet (50 mcg total) by mouth daily at 6 (six) AM.  03/10/23   Starleen Blue, NP  losartan (COZAAR) 100 MG tablet Take 1 tablet (100 mg total) by mouth daily. 04/17/23   Arby Barrette, MD  losartan (COZAAR) 50 MG tablet Take 1 tablet (50 mg total) by mouth daily. 03/10/23   Starleen Blue, NP  melatonin 3 MG TABS tablet Take 1 tablet (3 mg total) by mouth at bedtime. 03/09/23   Starleen Blue, NP  nicotine (NICODERM CQ - DOSED IN MG/24 HOURS) 14 mg/24hr patch Place 1 patch (14 mg total) onto the skin daily. Patient not taking: Reported on 03/12/2023 03/10/23   Starleen Blue, NP  pantoprazole (PROTONIX) 40 MG tablet Take 1 tablet (40 mg total) by mouth daily before breakfast. 03/09/23 04/08/23  Starleen Blue, NP  risperiDONE (RISPERDAL) 1 MG tablet Take 1 tablet (1 mg total) by mouth 2 (two) times daily. 03/09/23   Starleen Blue, NP  Vitamin D, Ergocalciferol, (DRISDOL) 1.25 MG (50000 UNIT) CAPS capsule Take 1 capsule (50,000 Units total) by mouth every 7 (seven) days. 03/12/23   Starleen Blue, NP      Allergies    Ace inhibitors and Lisinopril    Review of Systems   Review of Systems  Eyes:  Negative for visual disturbance.  Respiratory:  Negative for shortness of breath.   Cardiovascular:  Negative for chest pain.  Neurological:  Positive for light-headedness. Negative for syncope, weakness, numbness and headaches.  All other systems reviewed and  are negative.   Physical Exam Updated Vital Signs BP (!) 177/107   Pulse 79   Resp 20   LMP 04/08/2023   SpO2 100%  Physical Exam Vitals and nursing note reviewed.  Constitutional:      General: She is not in acute distress.    Appearance: Normal appearance. She is not ill-appearing.  HENT:     Head: Normocephalic and atraumatic.     Nose: Nose normal.  Eyes:     Conjunctiva/sclera: Conjunctivae normal.  Cardiovascular:     Rate and Rhythm: Normal rate and regular rhythm.     Heart sounds: Normal heart sounds.  Pulmonary:     Effort: Pulmonary effort is normal. No respiratory distress.      Breath sounds: No wheezing.  Abdominal:     General: There is no distension.     Palpations: Abdomen is soft.     Tenderness: There is no abdominal tenderness. There is no guarding.  Musculoskeletal:        General: No deformity. Normal range of motion.     Cervical back: Normal range of motion.  Skin:    Findings: No rash.  Neurological:     Mental Status: She is alert.     ED Results / Procedures / Treatments   Labs (all labs ordered are listed, but only abnormal results are displayed) Labs Reviewed - No data to display  EKG None  Radiology No results found.  Procedures Procedures    Medications Ordered in ED Medications - No data to display  ED Course/ Medical Decision Making/ A&P                             Medical Decision Making Amount and/or Complexity of Data Reviewed Radiology: ordered.   52 year old female from [facility] for concern of elevated blood pressure.  This is an ongoing since yesterday.  She was evaluated in the emergency room yesterday and had ACS workup which was reassuring.  She states her symptoms have not changed.  Instead her symptoms are resolved.  She states she just noticed her blood pressure to be elevated today and was concerned.  She started the increased dose of losartan today.  Due to lightheadedness and paresthesias she would like to have a CT scan done.  Will obtain this.  CT scan does not show any acute intracranial finding.  Discussed concerns that would involve endorgan damage in setting of elevated blood pressure.  Return precautions discussed.  Patient voices understanding and is in agreement with plan.  Given she is without chest pain, shortness of breath no indication to repeat blood work today.  As it was just done within the past 24 hours.  PCP referral given.   Final Clinical Impression(s) / ED Diagnoses Final diagnoses:  Uncontrolled hypertension    Rx / DC Orders ED Discharge Orders     None         Marita Kansas, PA-C 04/17/23 1510    Linwood Dibbles, MD 04/18/23 [phone]

## 2023-04-17 NOTE — Discharge Instructions (Addendum)
1.  Discontinue taking ibuprofen.  In people who have hypertension and kidney disease, medications in the category of NSAIDs (ibuprofen\Motrin, Aleve\naproxen, meloxicam) can increase blood pressure.  You need medication for pain take extra strength Tylenol every 6 hours. 2.  Increase your Cozaar dose to 100 mg daily.  Monitor your blood pressures several times a day. 3.  Follow-up with your family doctor for continued management of your medications and blood pressure soon as possible. 4.  Return to the emergency department if you get a headache, blurred vision, chest pain or other concerning symptoms.

## 2023-04-30 ENCOUNTER — Other Ambulatory Visit: Payer: Self-pay | Admitting: *Deleted

## 2023-04-30 ENCOUNTER — Telehealth: Payer: Self-pay | Admitting: *Deleted

## 2023-04-30 DIAGNOSIS — D500 Iron deficiency anemia secondary to blood loss (chronic): Secondary | ICD-10-CM

## 2023-04-30 NOTE — Telephone Encounter (Signed)
Patient called to notify of the stool test needed to check for eradication of the H. Pylori infection (gastritis) per Dr. Tomasa Rand. VM left stating to omit taking the Protonix medication 2 weeks prior to the stool test. Hours of operation and location given. Orders placed for H. Pylori stool.

## 2023-04-30 NOTE — Telephone Encounter (Signed)
-----   Message from Chrystie Nose, RN sent at 04/30/2023  8:24 AM EDT ----- Regarding: FW: Labs  ----- Message ----- From: Chrystie Nose, RN Sent: 04/30/2023  12:00 AM EDT To: Chrystie Nose, RN Subject: Labs                                           Pt needs to come for stool test, order in epic. Stop protonix for 2 weeks

## 2023-06-13 ENCOUNTER — Encounter (HOSPITAL_COMMUNITY): Payer: Self-pay

## 2023-06-13 ENCOUNTER — Ambulatory Visit (HOSPITAL_COMMUNITY)
Admission: EM | Admit: 2023-06-13 | Discharge: 2023-06-13 | Disposition: A | Discharge status: Home / Self Care | Payer: No Typology Code available for payment source | Attending: Family Medicine | Admitting: Family Medicine

## 2023-06-13 DIAGNOSIS — R350 Frequency of micturition: Secondary | ICD-10-CM | POA: Diagnosis not present

## 2023-06-13 DIAGNOSIS — M545 Low back pain, unspecified: Secondary | ICD-10-CM | POA: Diagnosis not present

## 2023-06-13 LAB — POCT URINALYSIS DIP (MANUAL ENTRY)
Bilirubin, UA: NEGATIVE
Glucose, UA: NEGATIVE mg/dL
Ketones, POC UA: NEGATIVE mg/dL
Leukocytes, UA: NEGATIVE
Nitrite, UA: NEGATIVE
Protein Ur, POC: NEGATIVE mg/dL
Spec Grav, UA: 1.02 (ref 1.010–1.025)
Urobilinogen, UA: 1 E.U./dL
pH, UA: 6 (ref 5.0–8.0)

## 2023-06-13 LAB — POCT URINE PREGNANCY: Preg Test, Ur: NEGATIVE

## 2023-06-13 MED ORDER — KETOROLAC TROMETHAMINE 30 MG/ML IJ SOLN
INTRAMUSCULAR | Status: AC
  Filled 2023-06-13: qty 1

## 2023-06-13 MED ORDER — TIZANIDINE HCL 4 MG PO TABS: 4.0000 mg | ORAL_TABLET | Freq: Three times a day (TID) | ORAL | 0 refills | Status: DC | PRN

## 2023-06-13 MED ORDER — KETOROLAC TROMETHAMINE 30 MG/ML IJ SOLN
30.0000 mg | Freq: Once | INTRAMUSCULAR | Status: AC
  Administered 2023-06-13: 30 mg via INTRAMUSCULAR

## 2023-06-13 MED ORDER — KETOROLAC TROMETHAMINE 10 MG PO TABS: 10.0000 mg | ORAL_TABLET | Freq: Four times a day (QID) | ORAL | 0 refills | Status: DC | PRN

## 2023-06-13 MED ORDER — CEPHALEXIN 500 MG PO CAPS: 500.0000 mg | ORAL_CAPSULE | Freq: Three times a day (TID) | ORAL | 0 refills | Status: AC

## 2023-06-13 NOTE — Discharge Instructions (Signed)
Your pregnancy test was negative the urinalysis had blood on it.  Urine culture is sent.  Take cephalexin 250 mg--1 capsule 3 times daily for 7 days  You have been given a shot of Toradol 30 mg today.  Ketorolac 10 mg tablets--take 1 tablet every 6 hours as needed for pain.  This is the same medicine that is in the shot we just gave you   Take tizanidine 4 mg--1 every 8 hours as needed for muscle spasms; this medication can cause dizziness and sleepiness  Staff will call you if the urine culture looks like your antibiotic needs to be changed.  If you are worsening anyway or not improving in the next 24 to 48 hours, these consider going to the emergency room for further evaluation

## 2023-06-13 NOTE — ED Triage Notes (Signed)
Patient here today with c/o left side LB pain X 1 day. Pain worsened today. Patient has also noticed that she has been having some lower abd pain and pain in urination. She has also been having frequent urination.

## 2023-06-13 NOTE — ED Provider Notes (Signed)
[facility]    CSN: [id number] Arrival date & time: 06/13/23  1554      History   Chief Complaint Chief Complaint  Patient presents with   Back Pain    HPI Marie Atkins is a 52 y.o. female.    Back Pain Here for low back pain that started yesterday.  It has worsened today and it hurts worse when she bends or arises from a sitting position or when she sits.  She is also experiencing some pain in her suprapubic and right lower quadrant area when she empties her bladder or when she moves.  No fever or chills.  She has been having urinary frequency, but no urinary burning.  No fever or chills and no nausea or vomiting. She has been having a prolonged.  That began July 14.  She has had her tubes tied.  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      Home Medications    Prior to Admission medications   Medication Sig Start Date End Date Taking? Authorizing Provider  Bismuth 262 MG CHEW Chew 524 mg by mouth in the morning, at noon, in the evening, and at bedtime. 03/19/23   Jenel Lucks, MD  cephALEXin (KEFLEX) 500 MG capsule Take 1 capsule (500 mg total) by mouth 3 (three) times daily for 7 days. 06/13/23 06/20/23 Yes Drena Ham, Janace Aris, MD  ketorolac (TORADOL) 10 MG tablet Take 1 tablet (10 mg total) by mouth every 6 (six) hours as needed (pain). 06/13/23  Yes Zenia Resides, MD  tiZANidine (ZANAFLEX) 4 MG tablet Take 1 tablet (4 mg total) by mouth every 8 (eight) hours as needed for muscle spasms. 06/13/23  Yes Zenia Resides, MD  FLUoxetine (PROZAC) 10 MG capsule Take 3 capsules (30 mg total) by mouth daily. 03/10/23   Starleen Blue, NP  gabapentin (NEURONTIN) 300 MG capsule Take 1 capsule (300 mg total) by mouth 3 (three) times daily. 03/09/23   Starleen Blue, NP  levothyroxine (SYNTHROID) 50 MCG tablet Take 1 tablet (50 mcg total) by mouth daily at 6 (six) AM. 03/10/23   Starleen Blue, NP  losartan (COZAAR) 100 MG tablet Take 1 tablet (100 mg total) by mouth daily. 04/17/23   Arby Barrette, MD  melatonin 3 MG TABS tablet Take 1 tablet (3 mg total) by mouth at bedtime. 03/09/23   Starleen Blue, NP  nicotine (NICODERM CQ - DOSED IN MG/24 HOURS) 14 mg/24hr patch Place 1 patch (14 mg total) onto the skin daily. Patient not taking: Reported on 03/12/2023 03/10/23  Starleen Blue, NP  risperiDONE (RISPERDAL) 1 MG tablet Take 1 tablet (1 mg total) by mouth 2 (two) times daily. 03/09/23   Starleen Blue, NP  Vitamin D, Ergocalciferol, (DRISDOL) 1.25 MG (50000 UNIT) CAPS capsule Take 1 capsule (50,000 Units total) by mouth every 7 (seven) days. 03/12/23   Starleen Blue, NP    Family History Family History  Problem Relation Age of Onset   Hypertension Mother    Diabetes Mother    CVA Mother    Heart attack Mother    Colon cancer Maternal Uncle     Social History Social History   Tobacco Use   Smoking status: Every Day    Current packs/day: 0.25    Average packs/day: 0.3 packs/day for 36.0 years (9.0 ttl pk-yrs)    Types: Cigarettes   Smokeless tobacco: Never   Tobacco  comments:    10 cigarettes/day  Vaping Use   Vaping status: Never Used  Substance Use Topics   Alcohol use: Yes    Alcohol/week: 1.0 standard drink of alcohol    Types: 1 Cans of beer per week    Comment: "sometimes every other day," "1 to 2 cans of beer or a glass or two of wine"   Drug use: Yes    Types: Cocaine    Comment: 3 to 4 times a week, last use on friday 02/28/23     Allergies   Ace inhibitors and Lisinopril   Review of Systems Review of Systems  Musculoskeletal:  Positive for back pain.     Physical Exam Triage Vital Signs ED Triage Vitals  Encounter Vitals Group     BP 06/13/23 1713 (!) 145/87     Systolic BP Percentile --      Diastolic BP Percentile --      Pulse Rate 06/13/23 1713 87     Resp 06/13/23 1713 16     Temp 06/13/23 1713 98.2 F (36.8 C)     Temp Source 06/13/23 1713 Oral     SpO2 06/13/23 1713 99 %     Weight 06/13/23 1708 185 lb (83.9 kg)     Height 06/13/23 1708 5\' 3"  (1.6 m)     Head Circumference --      Peak Flow --      Pain Score 06/13/23 1706 10     Pain Loc --      Pain Education --      Exclude from Growth Chart --    No data found.  Updated Vital Signs BP (!) 145/87 (BP Location: Left Arm)   Pulse 87   Temp 98.2 F (36.8 C) (Oral)   Resp 16   Ht 5\' 3"  (1.6 m)   Wt 83.9 kg   LMP 05/25/2023 (Approximate)   SpO2 99%   BMI 32.77 kg/m   Visual Acuity Right Eye Distance:   Left Eye Distance:   Bilateral Distance:    Right Eye Near:   Left Eye Near:    Bilateral Near:     Physical Exam Vitals reviewed.  Constitutional:      General: She is not in acute distress.    Appearance: She is not ill-appearing, toxic-appearing or diaphoretic.  HENT:     Mouth/Throat:     Mouth: Mucous membranes are moist.  Eyes:     Extraocular Movements: Extraocular movements intact.     Conjunctiva/sclera: Conjunctivae normal.     Pupils: Pupils are equal, round, and reactive to light.  Cardiovascular:  Rate and Rhythm:  Normal rate and regular rhythm.     Heart sounds: No murmur heard. Pulmonary:     Effort: Pulmonary effort is normal.     Breath sounds: Normal breath sounds.  Abdominal:     General: There is no distension.     Palpations: Abdomen is soft.     Tenderness: There is no abdominal tenderness.  Musculoskeletal:     Cervical back: Neck supple.     Comments: The LS area is tender bilaterally.  There is no rash  Lymphadenopathy:     Cervical: No cervical adenopathy.  Skin:    Coloration: Skin is not pale.  Neurological:     General: No focal deficit present.  Psychiatric:        Behavior: Behavior normal.      UC Treatments / Results  Labs (all labs ordered are listed, but only abnormal results are displayed) Labs Reviewed  POCT URINALYSIS DIP (MANUAL ENTRY) - Abnormal; Notable for the following components:      Result Value   Blood, UA large (*)    All other components within normal limits  URINE CULTURE  POCT URINE PREGNANCY    EKG   Radiology No results found.  Procedures Procedures (including critical care time)  Medications Ordered in UC Medications  ketorolac (TORADOL) 30 MG/ML injection 30 mg (has no administration in time range)    Initial Impression / Assessment and Plan / UC Course  I have reviewed the triage vital signs and the nursing notes.  Pertinent labs & imaging results that were available during my care of the patient were reviewed by me and considered in my medical decision making (see chart for details).       Urinalysis only shows blood.  Though that could be from menstrual blood, culture is sent and when to treat empirically for possible UTI with cephalexin.   UPT is negative.  She is therefore also given a shot of Toradol and Toradol tablets are sent to the pharmacy as is a muscle relaxer. I have asked her to proceed to the emergency room if she is worsening or not improving  Final Clinical Impressions(s) / UC Diagnoses   Final  diagnoses:  Acute bilateral low back pain without sciatica  Urinary frequency     Discharge Instructions      Your pregnancy test was negative the urinalysis had blood on it.  Urine culture is sent.  Take cephalexin 250 mg--1 capsule 3 times daily for 7 days  You have been given a shot of Toradol 30 mg today.  Ketorolac 10 mg tablets--take 1 tablet every 6 hours as needed for pain.  This is the same medicine that is in the shot we just gave you   Take tizanidine 4 mg--1 every 8 hours as needed for muscle spasms; this medication can cause dizziness and sleepiness  Staff will call you if the urine culture looks like your antibiotic needs to be changed.  If you are worsening anyway or not improving in the next 24 to 48 hours, these consider going to the emergency room for further evaluation       ED Prescriptions     Medication Sig Dispense Auth. Provider   cephALEXin (KEFLEX) 500 MG capsule Take 1 capsule (500 mg total) by mouth 3 (three) times daily for 7 days. 21 capsule Zenia Resides, MD   ketorolac (TORADOL) 10 MG tablet Take 1 tablet (10 mg total) by mouth every 6 (six) hours as  needed (pain). 20 tablet Scherry Laverne, Janace Aris, MD   tiZANidine (ZANAFLEX) 4 MG tablet Take 1 tablet (4 mg total) by mouth every 8 (eight) hours as needed for muscle spasms. 15 tablet Tahj Lindseth, Janace Aris, MD      I have reviewed the PDMP during this encounter.   Zenia Resides, MD 06/13/23 [phone]

## 2023-08-09 ENCOUNTER — Emergency Department (HOSPITAL_COMMUNITY)
Admission: EM | Admit: 2023-08-09 | Discharge: 2023-08-10 | Disposition: A | Discharge status: Other Acute Inpatient Hospital | Payer: No Typology Code available for payment source | Attending: Emergency Medicine | Admitting: Emergency Medicine

## 2023-08-09 ENCOUNTER — Other Ambulatory Visit: Payer: Self-pay

## 2023-08-09 DIAGNOSIS — F32A Depression, unspecified: Secondary | ICD-10-CM | POA: Diagnosis present

## 2023-08-09 DIAGNOSIS — Z79899 Other long term (current) drug therapy: Secondary | ICD-10-CM | POA: Diagnosis not present

## 2023-08-09 DIAGNOSIS — F1490 Cocaine use, unspecified, uncomplicated: Secondary | ICD-10-CM | POA: Diagnosis not present

## 2023-08-09 DIAGNOSIS — R45851 Suicidal ideations: Secondary | ICD-10-CM | POA: Insufficient documentation

## 2023-08-09 DIAGNOSIS — F1410 Cocaine abuse, uncomplicated: Secondary | ICD-10-CM | POA: Diagnosis present

## 2023-08-09 DIAGNOSIS — F333 Major depressive disorder, recurrent, severe with psychotic symptoms: Secondary | ICD-10-CM | POA: Diagnosis present

## 2023-08-09 LAB — RAPID URINE DRUG SCREEN, HOSP PERFORMED
Amphetamines: NOT DETECTED
Barbiturates: NOT DETECTED
Benzodiazepines: NOT DETECTED
Cocaine: POSITIVE — AB
Opiates: NOT DETECTED
Tetrahydrocannabinol: NOT DETECTED

## 2023-08-09 LAB — CBC
HCT: 28.5 % — ABNORMAL LOW (ref 36.0–46.0)
Hemoglobin: 8.2 g/dL — ABNORMAL LOW (ref 12.0–15.0)
MCH: 20.7 pg — ABNORMAL LOW (ref 26.0–34.0)
MCHC: 28.8 g/dL — ABNORMAL LOW (ref 30.0–36.0)
MCV: 72 fL — ABNORMAL LOW (ref 80.0–100.0)
Platelets: 525 10*3/uL — ABNORMAL HIGH (ref 150–400)
RBC: 3.96 MIL/uL (ref 3.87–5.11)
RDW: 25.2 % — ABNORMAL HIGH (ref 11.5–15.5)
WBC: 9.8 10*3/uL (ref 4.0–10.5)
nRBC: 0 % (ref 0.0–0.2)

## 2023-08-09 LAB — SALICYLATE LEVEL: Salicylate Lvl: <7 mg/dL — ABNORMAL LOW (ref 7.0–30.0)

## 2023-08-09 LAB — ACETAMINOPHEN LEVEL: Acetaminophen (Tylenol), Serum: <10 ug/mL — ABNORMAL LOW (ref 10–30)

## 2023-08-09 LAB — COMPREHENSIVE METABOLIC PANEL
ALT: 14 U/L (ref 0–44)
AST: 15 U/L (ref 15–41)
Albumin: 3.3 g/dL — ABNORMAL LOW (ref 3.5–5.0)
Alkaline Phosphatase: 86 U/L (ref 38–126)
Anion gap: 5 (ref 5–15)
BUN: 15 mg/dL (ref 6–20)
CO2: 26 mmol/L (ref 22–32)
Calcium: 8.3 mg/dL — ABNORMAL LOW (ref 8.9–10.3)
Chloride: 105 mmol/L (ref 98–111)
Creatinine, Ser: 0.86 mg/dL (ref 0.44–1.00)
GFR, Estimated: >60 mL/min (ref >60)
Glucose, Bld: 94 mg/dL (ref 70–99)
Potassium: 3.8 mmol/L (ref 3.5–5.1)
Sodium: 136 mmol/L (ref 135–145)
Total Bilirubin: 0.2 mg/dL — ABNORMAL LOW (ref 0.3–1.2)
Total Protein: 7.1 g/dL (ref 6.5–8.1)

## 2023-08-09 LAB — HCG, SERUM, QUALITATIVE: Preg, Serum: NEGATIVE

## 2023-08-09 LAB — ETHANOL: Alcohol, Ethyl (B): <10 mg/dL (ref <10)

## 2023-08-09 MED ORDER — RISPERIDONE 1 MG PO TABS
1.0000 mg | ORAL_TABLET | Freq: Two times a day (BID) | ORAL | Status: DC
  Administered 2023-08-10 (×2): 1 mg via ORAL
  Filled 2023-08-09 (×2): qty 1

## 2023-08-09 MED ORDER — NICOTINE 14 MG/24HR TD PT24
14.0000 mg | MEDICATED_PATCH | Freq: Every day | TRANSDERMAL | Status: DC
  Filled 2023-08-09 (×2): qty 1

## 2023-08-09 MED ORDER — FLUOXETINE HCL 20 MG PO CAPS
40.0000 mg | ORAL_CAPSULE | Freq: Every day | ORAL | Status: DC
  Administered 2023-08-09 – 2023-08-10 (×2): 40 mg via ORAL
  Filled 2023-08-09 (×2): qty 2

## 2023-08-09 MED ORDER — MIRTAZAPINE 15 MG PO TBDP
15.0000 mg | ORAL_TABLET | Freq: Every day | ORAL | Status: DC
  Administered 2023-08-10: 15 mg via ORAL
  Filled 2023-08-09 (×2): qty 1

## 2023-08-09 MED ORDER — GABAPENTIN 300 MG PO CAPS
300.0000 mg | ORAL_CAPSULE | Freq: Three times a day (TID) | ORAL | Status: DC
  Administered 2023-08-09 – 2023-08-10 (×3): 300 mg via ORAL
  Filled 2023-08-09 (×3): qty 1
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
## 2023-08-09 NOTE — ED Triage Notes (Signed)
Pt arrived via POV. C/o SI, with plan to slit wrists.  AOx4

## 2023-08-09 NOTE — ED Provider Notes (Signed)
[place] EMERGENCY DEPARTMENT AT [facility] Provider Note   CSN: [id number] Arrival date & time: 08/09/23  1346     History Chief Complaint  Patient presents with   Suicidal    HPI Marie Atkins is a 52 y.o. female presenting for SI.   Patient's recorded medical, surgical, social, medication list and allergies were reviewed in the Snapshot window as part of the initial history.   Review of Systems   Review of Systems  Constitutional:  Negative for chills and fever.  HENT:  Negative for ear pain and sore throat.   Eyes:  Negative for pain and visual disturbance.  Respiratory:  Negative for cough and shortness of breath.   Cardiovascular:  Negative for chest pain and palpitations.  Gastrointestinal:  Negative for abdominal pain and vomiting.  Genitourinary:  Negative for dysuria and hematuria.  Musculoskeletal:  Negative for arthralgias and back pain.  Skin:  Negative for color change and rash.  Neurological:  Negative for seizures and syncope.  All other systems reviewed and are negative.   Physical Exam Updated Vital Signs BP (!) 145/90 (BP Location: Left Arm)   Pulse 88   Temp 98.8 F (37.1 C) (Oral)   Resp 16   Ht 5\' 3"  (1.6 m)   Wt 80.7 kg   SpO2 99%   BMI 31.53 kg/m  Physical Exam Vitals and nursing note reviewed.  Constitutional:      General: She is not in acute distress.    Appearance: She is well-developed. She is not ill-appearing or toxic-appearing.  HENT:     Head: Normocephalic and atraumatic.  Eyes:     Extraocular Movements: Extraocular movements intact.     Conjunctiva/sclera: Conjunctivae normal.     Pupils: Pupils are equal, round, and reactive to light.  Cardiovascular:     Rate and Rhythm: Normal rate and regular rhythm.     Heart sounds: No murmur heard. Pulmonary:     Effort: Pulmonary effort is normal. No respiratory distress.     Breath sounds: Normal breath sounds.  Abdominal:     General: Abdomen is flat.      Palpations: Abdomen is soft.     Tenderness: There is no abdominal tenderness.  Musculoskeletal:        General: No swelling, deformity or signs of injury.     Cervical back: Normal range of motion and neck supple. No rigidity.  Skin:    General: Skin is warm and dry.     Capillary Refill: Capillary refill takes less than 2 seconds.  Neurological:     General: No focal deficit present.     Mental Status: She is alert and oriented to person, place, and time.  Psychiatric:        Mood and Affect: Mood normal.      ED Course/ Medical Decision Making/ A&P    Procedures Procedures   Medications Ordered in ED Medications - No data to display  Medical Decision Making:   Marie Atkins is a 52 y.o. female who presented to the ED today for psychiatric evaluation.  Patient is endorsing SI with plan to cut her arteries.  Patient does have a history of MDD .  They are notcompliant with their medications.  On my initial exam, the pt was linear in thought, dysphoric in affect, and overall well-appearing.  Vital signs reviewed and reassuring.  Reviewed and confirmed nursing documentation for past medical history, family history, social history.     Initial Assessment:  This is most consistent with an acute life threatening illness. With the patient's presentation of SI with plan, patient warrants emergent psychiatric consultation.  Differential includes primary psychosis, substance-induced psychosis, mood disturbance.  Initial Plan:  Patient immediately placed into ED psychiatric hold protocol including suicide precautions, elopement precautions and vital sign monitoring.    Emergent behavioral health hold signed and notarized while awaiting psychiatric consultation due to threat to self or others.  Psychiatry consulted for further evaluation once patient medically cleared.  Medical screening evaluation ordered and reviewed with no obvious medical reason to postpone psychiatric evaluation.   Patient is voluntary at this time.  No IVC.  May need to be reassessed if capacity is changing.     Final Assessment and Plan:   Patient placed into psychiatric care plan per hospital policy pending their recommendations on disposition. Medically cleared for psychiatric placement.     Clinical Impression:  1. Suicidal ideation      Data Unavailable   Final Clinical Impression(s) / ED Diagnoses Final diagnoses:  Suicidal ideation    Rx / DC Orders ED Discharge Orders     None         Glyn Ade, MD 08/09/23 1652

## 2023-08-09 NOTE — ED Notes (Signed)
2 bags of patient belongings was placed in the cabinets of the 5-8 nurses station.

## 2023-08-10 ENCOUNTER — Inpatient Hospital Stay (HOSPITAL_COMMUNITY)
Admission: AD | Admit: 2023-08-10 | Discharge: 2023-08-19 | DRG: 885 | Disposition: A | Discharge status: Home / Self Care | Payer: No Typology Code available for payment source | Source: Intra-hospital | Attending: Psychiatry | Admitting: Psychiatry

## 2023-08-10 ENCOUNTER — Encounter (HOSPITAL_COMMUNITY): Payer: Self-pay | Admitting: Nurse Practitioner

## 2023-08-10 DIAGNOSIS — K219 Gastro-esophageal reflux disease without esophagitis: Secondary | ICD-10-CM | POA: Diagnosis present

## 2023-08-10 DIAGNOSIS — Z833 Family history of diabetes mellitus: Secondary | ICD-10-CM | POA: Diagnosis not present

## 2023-08-10 DIAGNOSIS — F1410 Cocaine abuse, uncomplicated: Secondary | ICD-10-CM | POA: Diagnosis present

## 2023-08-10 DIAGNOSIS — Z560 Unemployment, unspecified: Secondary | ICD-10-CM | POA: Diagnosis not present

## 2023-08-10 DIAGNOSIS — Z8249 Family history of ischemic heart disease and other diseases of the circulatory system: Secondary | ICD-10-CM | POA: Diagnosis not present

## 2023-08-10 DIAGNOSIS — D509 Iron deficiency anemia, unspecified: Secondary | ICD-10-CM | POA: Diagnosis present

## 2023-08-10 DIAGNOSIS — R45851 Suicidal ideations: Secondary | ICD-10-CM | POA: Diagnosis present

## 2023-08-10 DIAGNOSIS — Z9152 Personal history of nonsuicidal self-harm: Secondary | ICD-10-CM

## 2023-08-10 DIAGNOSIS — F410 Panic disorder [episodic paroxysmal anxiety] without agoraphobia: Secondary | ICD-10-CM | POA: Diagnosis present

## 2023-08-10 DIAGNOSIS — Z5900 Homelessness unspecified: Secondary | ICD-10-CM

## 2023-08-10 DIAGNOSIS — Z888 Allergy status to other drugs, medicaments and biological substances status: Secondary | ICD-10-CM | POA: Diagnosis not present

## 2023-08-10 DIAGNOSIS — Z79899 Other long term (current) drug therapy: Secondary | ICD-10-CM

## 2023-08-10 DIAGNOSIS — F1010 Alcohol abuse, uncomplicated: Secondary | ICD-10-CM | POA: Diagnosis present

## 2023-08-10 DIAGNOSIS — F17210 Nicotine dependence, cigarettes, uncomplicated: Secondary | ICD-10-CM | POA: Diagnosis present

## 2023-08-10 DIAGNOSIS — F333 Major depressive disorder, recurrent, severe with psychotic symptoms: Secondary | ICD-10-CM | POA: Diagnosis present

## 2023-08-10 DIAGNOSIS — Z823 Family history of stroke: Secondary | ICD-10-CM | POA: Diagnosis not present

## 2023-08-10 DIAGNOSIS — E039 Hypothyroidism, unspecified: Secondary | ICD-10-CM | POA: Diagnosis present

## 2023-08-10 DIAGNOSIS — Z5982 Transportation insecurity: Secondary | ICD-10-CM

## 2023-08-10 DIAGNOSIS — F32A Depression, unspecified: Secondary | ICD-10-CM | POA: Diagnosis not present

## 2023-08-10 DIAGNOSIS — I10 Essential (primary) hypertension: Secondary | ICD-10-CM | POA: Diagnosis present

## 2023-08-10 DIAGNOSIS — Z818 Family history of other mental and behavioral disorders: Secondary | ICD-10-CM | POA: Diagnosis not present

## 2023-08-10 DIAGNOSIS — Z9151 Personal history of suicidal behavior: Secondary | ICD-10-CM

## 2023-08-10 MED ORDER — LORAZEPAM 2 MG/ML IJ SOLN: 2.0000 mg | Freq: Three times a day (TID) | INTRAMUSCULAR | Status: DC | PRN

## 2023-08-10 MED ORDER — GABAPENTIN 300 MG PO CAPS
300.0000 mg | ORAL_CAPSULE | Freq: Three times a day (TID) | ORAL | Status: DC
  Administered 2023-08-10 – 2023-08-19 (×26): 300 mg via ORAL
  Filled 2023-08-10 (×31): qty 1

## 2023-08-10 MED ORDER — PANTOPRAZOLE SODIUM 40 MG PO TBEC
40.0000 mg | DELAYED_RELEASE_TABLET | Freq: Every day | ORAL | Status: DC
  Administered 2023-08-10 – 2023-08-19 (×10): 40 mg via ORAL
  Filled 2023-08-10 (×12): qty 1

## 2023-08-10 MED ORDER — MAGNESIUM HYDROXIDE 400 MG/5ML PO SUSP
15.0000 mL | Freq: Every day | ORAL | Status: DC | PRN
  Administered 2023-08-13: 15 mL via ORAL
  Filled 2023-08-10: qty 30

## 2023-08-10 MED ORDER — NICOTINE POLACRILEX 2 MG MT GUM
2.0000 mg | CHEWING_GUM | OROMUCOSAL | Status: DC | PRN
  Administered 2023-08-15 – 2023-08-17 (×3): 2 mg via ORAL
  Filled 2023-08-10: qty 1

## 2023-08-10 MED ORDER — LOSARTAN POTASSIUM 50 MG PO TABS
100.0000 mg | ORAL_TABLET | Freq: Every day | ORAL | Status: DC
  Filled 2023-08-10 (×2): qty 2

## 2023-08-10 MED ORDER — RISPERIDONE 1 MG PO TABS
1.0000 mg | ORAL_TABLET | Freq: Two times a day (BID) | ORAL | Status: DC
  Administered 2023-08-10 – 2023-08-12 (×4): 1 mg via ORAL
  Filled 2023-08-10 (×8): qty 1

## 2023-08-10 MED ORDER — LORAZEPAM 1 MG PO TABS: 2.0000 mg | ORAL_TABLET | Freq: Three times a day (TID) | ORAL | Status: DC | PRN

## 2023-08-10 MED ORDER — PNEUMOCOCCAL 20-VAL CONJ VACC 0.5 ML IM SUSY
0.5000 mL | PREFILLED_SYRINGE | INTRAMUSCULAR | Status: DC
  Filled 2023-08-10: qty 0.5

## 2023-08-10 MED ORDER — HALOPERIDOL LACTATE 5 MG/ML IJ SOLN: 5.0000 mg | Freq: Three times a day (TID) | INTRAMUSCULAR | Status: DC | PRN

## 2023-08-10 MED ORDER — INFLUENZA VIRUS VACC SPLIT PF (FLUZONE) 0.5 ML IM SUSY
0.5000 mL | PREFILLED_SYRINGE | INTRAMUSCULAR | Status: DC
  Filled 2023-08-10: qty 0.5

## 2023-08-10 MED ORDER — NICOTINE 14 MG/24HR TD PT24
14.0000 mg | MEDICATED_PATCH | Freq: Every day | TRANSDERMAL | Status: DC
  Filled 2023-08-10 (×2): qty 1

## 2023-08-10 MED ORDER — LEVOTHYROXINE SODIUM 50 MCG PO TABS
50.0000 ug | ORAL_TABLET | Freq: Every day | ORAL | Status: DC
  Administered 2023-08-11 – 2023-08-19 (×9): 50 ug via ORAL
  Filled 2023-08-10 (×6): qty 1
  Filled 2023-08-10: qty 2
  Filled 2023-08-10 (×4): qty 1

## 2023-08-10 MED ORDER — HALOPERIDOL 5 MG PO TABS: 5.0000 mg | ORAL_TABLET | Freq: Three times a day (TID) | ORAL | Status: DC | PRN

## 2023-08-10 MED ORDER — ALUM & MAG HYDROXIDE-SIMETH 200-200-20 MG/5ML PO SUSP: 30.0000 mL | ORAL | Status: DC | PRN

## 2023-08-10 MED ORDER — DIPHENHYDRAMINE HCL 50 MG/ML IJ SOLN: 50.0000 mg | Freq: Three times a day (TID) | INTRAMUSCULAR | Status: DC | PRN

## 2023-08-10 MED ORDER — MIRTAZAPINE 15 MG PO TBDP
15.0000 mg | ORAL_TABLET | Freq: Every day | ORAL | Status: DC
  Administered 2023-08-10: 15 mg via ORAL
  Filled 2023-08-10 (×3): qty 1

## 2023-08-10 MED ORDER — ACETAMINOPHEN 325 MG PO TABS
650.0000 mg | ORAL_TABLET | Freq: Four times a day (QID) | ORAL | Status: DC | PRN
  Administered 2023-08-11: 650 mg via ORAL
  Filled 2023-08-10: qty 2

## 2023-08-10 MED ORDER — FLUOXETINE HCL 20 MG PO CAPS
40.0000 mg | ORAL_CAPSULE | Freq: Every day | ORAL | Status: DC
  Administered 2023-08-11 – 2023-08-12 (×2): 40 mg via ORAL
  Filled 2023-08-10 (×4): qty 2

## 2023-08-10 MED ORDER — DIPHENHYDRAMINE HCL 25 MG PO CAPS: 50.0000 mg | ORAL_CAPSULE | Freq: Three times a day (TID) | ORAL | Status: DC | PRN

## 2023-08-10 MED ORDER — LOSARTAN POTASSIUM 25 MG PO TABS
25.0000 mg | ORAL_TABLET | Freq: Every day | ORAL | Status: DC
  Administered 2023-08-10 – 2023-08-16 (×7): 25 mg via ORAL
  Filled 2023-08-10 (×12): qty 1
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
## 2023-08-10 NOTE — ED Notes (Signed)
Manufacturing engineer advised another employee would call back to take down information

## 2023-08-10 NOTE — ED Notes (Signed)
Writer called [facility] to give report, however due to different pt's behavior will have to call back

## 2023-08-10 NOTE — Progress Notes (Signed)
[facility] LCSW Progress Note   08/10/2023    10:48 AM  Marie Atkins   [id number]   Type of Contact and Topic:  Psychiatric Bed Placement   Pt accepted to [facility] 306-1    Patient meets inpatient criteria per [name], NP   The attending provider will be Dr. Pecolia Ades   Call report to [phone]    Veronda Prude, Paramedic @ [facility] ED notified.     Pt scheduled  to arrive at [facility] TODAY @ 1215.   Damita Dunnings, MSW, LCSW-A  10:49 AM 08/10/2023

## 2023-08-10 NOTE — Plan of Care (Signed)
  Problem: Education: Goal: Mental status will improve Outcome: Not Progressing   Problem: Health Behavior/Discharge Planning: Goal: Identification of resources available to assist in meeting health care needs will improve Outcome: Not Progressing   Problem: Coping: Goal: Coping ability will improve Outcome: Not Progressing Goal: Will verbalize feelings Outcome: Not Progressing
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
## 2023-08-11 ENCOUNTER — Encounter (HOSPITAL_COMMUNITY): Payer: Self-pay

## 2023-08-11 LAB — TSH: TSH: 2.951 u[IU]/mL (ref 0.350–4.500)

## 2023-08-11 MED ORDER — TRAZODONE HCL 50 MG PO TABS
50.0000 mg | ORAL_TABLET | Freq: Every evening | ORAL | Status: DC | PRN
  Administered 2023-08-18: 50 mg via ORAL
  Filled 2023-08-11 (×2): qty 1

## 2023-08-11 MED ORDER — WHITE PETROLATUM EX OINT
TOPICAL_OINTMENT | CUTANEOUS | Status: AC
  Filled 2023-08-11: qty 5

## 2023-08-11 MED ORDER — NICOTINE 14 MG/24HR TD PT24
14.0000 mg | MEDICATED_PATCH | Freq: Every day | TRANSDERMAL | Status: DC
  Filled 2023-08-11 (×10): qty 1

## 2023-08-11 MED ORDER — IBUPROFEN 400 MG PO TABS
400.0000 mg | ORAL_TABLET | Freq: Four times a day (QID) | ORAL | Status: DC
  Administered 2023-08-11 – 2023-08-16 (×14): 400 mg via ORAL
  Filled 2023-08-11 (×28): qty 1

## 2023-08-11 NOTE — Group Note (Signed)
Recreation Therapy Group Note   Group Topic:Stress Management  Group Date: 08/11/2023 Start Time: 7564 End Time: 0956 Facilitators: Modena Bellemare-McCall, LRT,CTRS Location: 300 Hall Dayroom   Group Topic: Stress Management  Goal Area(s) Addresses:  Patient will identify positive stress management techniques. Patient will identify benefits of using stress management post d/c.  Group Description: Meditation. LRT played a meditation from the Calm app that focused on taking in the characteristics of a mountain. It encouraged participates to envision how the mountain stands tall and endures whatever it's confronted with (ie. Changing weather, different seasons, time of day) and encouraged patients to take on that same attitude when they are faced with the challenges of life.   Education:  Stress Management, Discharge Planning.   Education Outcome: Acknowledges Education   Affect/Mood: N/A   Participation Level: Did not attend    Clinical Observations/Individualized Feedback:     Plan: Continue to engage patient in RT group sessions 2-3x/week.   Kaiser Belluomini-McCall, LRT,CTRS 08/11/2023 11:29 AM

## 2023-08-11 NOTE — BH IP Treatment Plan (Signed)
Interdisciplinary Treatment and Diagnostic Plan Update  08/11/2023 Time of Session: 10:40am Marie Atkins MRN: [id number]  Principal Diagnosis: Severe recurrent major depressive disorder with psychotic features [facility])  Secondary Diagnoses: Principal Problem:   Severe recurrent major depressive disorder with psychotic features (HCC)   Current Medications:  Current Facility-Administered Medications  Medication Dose Route Frequency Provider Last Rate Last Admin   acetaminophen (TYLENOL) tablet 650 mg  650 mg Oral Q6H PRN [name] C, NP   650 mg at 08/11/23 0803   alum & mag hydroxide-simeth (MAALOX/MYLANTA) 200-200-20 MG/5ML suspension 30 mL  30 mL Oral Q4H PRN [name] C, NP       diphenhydrAMINE (BENADRYL) capsule 50 mg  50 mg Oral TID PRN Earney Navy, NP       Or   diphenhydrAMINE (BENADRYL) injection 50 mg  50 mg Intramuscular TID PRN Earney Navy, NP       FLUoxetine (PROZAC) capsule 40 mg  40 mg Oral Daily [name] C, NP   40 mg at 08/11/23 4742   gabapentin (NEURONTIN) capsule 300 mg  300 mg Oral TID [name] C, NP   300 mg at 08/11/23 1300   haloperidol (HALDOL) tablet 5 mg  5 mg Oral TID PRN Earney Navy, NP       Or   haloperidol lactate (HALDOL) injection 5 mg  5 mg Intramuscular TID PRN [name] C, NP       ibuprofen (ADVIL) tablet 400 mg  400 mg Oral Q6H Peterson Ao, MD       influenza vac split trivalent PF (FLULAVAL) injection 0.5 mL  0.5 mL Intramuscular Tomorrow-1000 [place], Harrold Donath, MD       levothyroxine (SYNTHROID) tablet 50 mcg  50 mcg Oral Q0600 [name] C, NP   50 mcg at 08/11/23 0622   LORazepam (ATIVAN) tablet 2 mg  2 mg Oral TID PRN Earney Navy, NP       Or   LORazepam (ATIVAN) injection 2 mg  2 mg Intramuscular TID PRN Earney Navy, NP       losartan (COZAAR) tablet 25 mg  25 mg Oral Daily Starleen Blue, NP   25 mg at 08/11/23 5956   magnesium hydroxide (MILK OF  MAGNESIA) suspension 15 mL  15 mL Oral Daily PRN [name] C, NP       nicotine (NICODERM CQ - dosed in mg/24 hours) patch 14 mg  14 mg Transdermal Daily Peterson Ao, MD       nicotine polacrilex (NICORETTE) gum 2 mg  2 mg Oral PRN Massengill, Harrold Donath, MD       pantoprazole (PROTONIX) EC tablet 40 mg  40 mg Oral Daily [name] C, NP   40 mg at 08/11/23 3875   pneumococcal 20-valent conjugate vaccine (PREVNAR 20) injection 0.5 mL  0.5 mL Intramuscular Tomorrow-1000 Massengill, Harrold Donath, MD       risperiDONE (RISPERDAL) tablet 1 mg  1 mg Oral BID [name] C, NP   1 mg at 08/11/23 0803   PTA Medications: Medications Prior to Admission  Medication Sig Dispense Refill Last Dose   Bismuth 262 MG CHEW Chew 524 mg by mouth in the morning, at noon, in the evening, and at bedtime. (Patient not taking: Reported on 08/09/2023) 112 tablet 0    ARIPiprazole (ABILIFY) 5 MG tablet Take 5 mg by mouth daily. (Patient not taking: Reported on 08/09/2023)      cloNIDine (CATAPRES) 0.1 MG tablet Take 0.1 mg  by mouth 3 (three) times daily. (Patient not taking: Reported on 08/09/2023)      FLUoxetine (PROZAC) 20 MG capsule Take 40 mg by mouth in the morning. (Patient not taking: Reported on 08/09/2023)      gabapentin (NEURONTIN) 300 MG capsule Take 1 capsule (300 mg total) by mouth 3 (three) times daily. (Patient not taking: Reported on 08/09/2023) 90 capsule 0    ketorolac (TORADOL) 10 MG tablet Take 1 tablet (10 mg total) by mouth every 6 (six) hours as needed (pain). (Patient not taking: Reported on 08/09/2023) 20 tablet 0    levothyroxine (SYNTHROID) 50 MCG tablet Take 1 tablet (50 mcg total) by mouth daily at 6 (six) AM. (Patient not taking: Reported on 08/09/2023) 30 tablet 0    losartan (COZAAR) 100 MG tablet Take 1 tablet (100 mg total) by mouth daily. (Patient not taking: Reported on 08/09/2023) 30 tablet 0    melatonin 3 MG TABS tablet Take 1 tablet (3 mg total) by mouth at bedtime. (Patient  not taking: Reported on 08/09/2023) 30 tablet 0    mirtazapine (REMERON) 15 MG tablet Take 15 mg by mouth at bedtime. (Patient not taking: Reported on 08/09/2023)      nicotine (NICODERM CQ - DOSED IN MG/24 HOURS) 14 mg/24hr patch Place 1 patch (14 mg total) onto the skin daily. (Patient not taking: Reported on 03/12/2023) 28 patch 0    omeprazole (PRILOSEC) 20 MG capsule Take 20 mg by mouth daily. (Patient not taking: Reported on 08/09/2023)      risperiDONE (RISPERDAL) 1 MG tablet Take 1 tablet (1 mg total) by mouth 2 (two) times daily. (Patient not taking: Reported on 08/09/2023) 60 tablet 0    tiZANidine (ZANAFLEX) 4 MG tablet Take 1 tablet (4 mg total) by mouth every 8 (eight) hours as needed for muscle spasms. (Patient not taking: Reported on 08/09/2023) 15 tablet 0    Vitamin D, Ergocalciferol, (DRISDOL) 1.25 MG (50000 UNIT) CAPS capsule Take 1 capsule (50,000 Units total) by mouth every 7 (seven) days. (Patient not taking: Reported on 08/09/2023) 5 capsule 0     Patient Stressors: Marital or family conflict   Substance abuse    Patient Strengths: Capable of independent living  Motivation for treatment/growth   Treatment Modalities: Medication Management, Group therapy, Case management,  1 to 1 session with clinician, Psychoeducation, Recreational therapy.   Physician Treatment Plan for Primary Diagnosis: Severe recurrent major depressive disorder with psychotic features (HCC) Long Term Goal(s): Improvement in symptoms so as ready for discharge   Short Term Goals: Ability to identify changes in lifestyle to reduce recurrence of condition will improve Ability to verbalize feelings will improve Ability to disclose and discuss suicidal ideas Ability to demonstrate self-control will improve Ability to identify and develop effective coping behaviors will improve Ability to maintain clinical measurements within normal limits will improve Compliance with prescribed medications will  improve Ability to identify triggers associated with substance abuse/mental health issues will improve  Medication Management: Evaluate patient's response, side effects, and tolerance of medication regimen.  Therapeutic Interventions: 1 to 1 sessions, Unit Group sessions and Medication administration.  Evaluation of Outcomes: Progressing  Physician Treatment Plan for Secondary Diagnosis: Principal Problem:   Severe recurrent major depressive disorder with psychotic features (HCC)  Long Term Goal(s): Improvement in symptoms so as ready for discharge   Short Term Goals: Ability to identify changes in lifestyle to reduce recurrence of condition will improve Ability to verbalize feelings will improve Ability to disclose and  discuss suicidal ideas Ability to demonstrate self-control will improve Ability to identify and develop effective coping behaviors will improve Ability to maintain clinical measurements within normal limits will improve Compliance with prescribed medications will improve Ability to identify triggers associated with substance abuse/mental health issues will improve     Medication Management: Evaluate patient's response, side effects, and tolerance of medication regimen.  Therapeutic Interventions: 1 to 1 sessions, Unit Group sessions and Medication administration.  Evaluation of Outcomes: Progressing   RN Treatment Plan for Primary Diagnosis: Severe recurrent major depressive disorder with psychotic features (HCC) Long Term Goal(s): Knowledge of disease and therapeutic regimen to maintain health will improve  Short Term Goals: Ability to remain free from injury will improve, Ability to verbalize frustration and anger appropriately will improve, Ability to participate in decision making will improve, Ability to verbalize feelings will improve, Ability to identify and develop effective coping behaviors will improve, and Compliance with prescribed medications will  improve  Medication Management: RN will administer medications as ordered by provider, will assess and evaluate patient's response and provide education to patient for prescribed medication. RN will report any adverse and/or side effects to prescribing provider.  Therapeutic Interventions: 1 on 1 counseling sessions, Psychoeducation, Medication administration, Evaluate responses to treatment, Monitor vital signs and CBGs as ordered, Perform/monitor CIWA, COWS, AIMS and Fall Risk screenings as ordered, Perform wound care treatments as ordered.  Evaluation of Outcomes: Progressing   LCSW Treatment Plan for Primary Diagnosis: Severe recurrent major depressive disorder with psychotic features (HCC) Long Term Goal(s): Safe transition to appropriate next level of care at discharge, Engage patient in therapeutic group addressing interpersonal concerns.  Short Term Goals: Engage patient in aftercare planning with referrals and resources, Increase social support, Increase emotional regulation, Facilitate acceptance of mental health diagnosis and concerns, Identify triggers associated with mental health/substance abuse issues, and Increase skills for wellness and recovery  Therapeutic Interventions: Assess for all discharge needs, 1 to 1 time with Social worker, Explore available resources and support systems, Assess for adequacy in community support network, Educate family and significant other(s) on suicide prevention, Complete Psychosocial Assessment, Interpersonal group therapy.  Evaluation of Outcomes: Progressing   Progress in Treatment: Attending groups: No. Participating in groups: No. Taking medication as prescribed: Yes. Toleration medication: Yes. Family/Significant other contact made: No, will contact:  Pending consent. Patient understands diagnosis: Yes. Discussing patient identified problems/goals with staff: Yes. Medical problems stabilized or resolved: Yes. Denies suicidal/homicidal  ideation: Yes. Issues/concerns per patient self-inventory: No.  New problem(s) identified: No, Describe:  none reported  New Short Term/Long Term Goal(s):detox, medication management for mood stabilization; elimination of SI thoughts; development of comprehensive mental wellness/sobriety plan  Patient Goals:  "go into a rehab or treatment center, find a job and find my own place."  Discharge Plan or Barriers: Patient recently admitted. CSW will continue to follow and assess for appropriate referrals and possible discharge planning.    Reason for Continuation of Hospitalization: Depression Medication stabilization Suicidal ideation  Estimated Length of Stay:5-7 days   Last 3 [place] Suicide Severity Risk Score: Flowsheet Row [facility] (Current[facility] from 08/09/2023 in [facility] from 06/13/2023 in [facility] RISK CATEGORY High Risk High Risk No Risk       Last [facility] 2/9 Scores:    06/16/2020    4:08 PM 06/15/2020    9:56 AM  Depression screen  PHQ 2/9  Decreased Interest 2 1  Down, Depressed, Hopeless 2 1  PHQ - 2 Score 4 2  Altered sleeping 2 1  Tired, decreased energy 2 1  Change in appetite 2 1  Feeling bad or failure about yourself  2 2  Trouble concentrating 2 1  Moving slowly or fidgety/restless 2 1  Suicidal thoughts 1 0  PHQ-9 Score 17 9  Difficult doing work/chores Somewhat difficult Somewhat difficult    Scribe for Treatment Team: Izell Hinsdale, LCSW 08/11/2023 2:03 PM
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
## 2023-08-11 NOTE — Progress Notes (Signed)
Patient med compliant and cooperative on unit. Patient given tylenol, ibuprofen, and hot pack po prn due to stabbing left hip pain. Patient endorses passive SI with no plan and denies HI and A/V/H.    08/11/23 1000  Psych Admission Type (Psych Patients Only)  Admission Status Voluntary  Psychosocial Assessment  Patient Complaints Depression  Eye Contact Fair  Facial Expression Flat  Affect Depressed;Anxious  Speech Slow  Interaction Assertive  Motor Activity Slow  Appearance/Hygiene Unremarkable  Behavior Characteristics Cooperative;Anxious  Mood Depressed;Anxious  Thought Process  Coherency WDL  Content WDL  Delusions None reported or observed  Perception WDL  Hallucination None reported or observed  Judgment Poor  Confusion None  Danger to Self  Current suicidal ideation? Passive  Self-Injurious Behavior No self-injurious ideation or behavior indicators observed or expressed   Agreement Not to Harm Self Yes  Description of Agreement verbal  Danger to Others  Danger to Others None reported or observed

## 2023-08-11 NOTE — Group Note (Signed)
Date:  08/11/2023 Time:  8:58 AM  Group Topic/Focus:  Goals Group:   The focus of this group is to help patients establish daily goals to achieve during treatment and discuss how the patient can incorporate goal setting into their daily lives to aide in recovery.    Participation Level:  Did Not Attend  Additional Comments:  Pt chose not to attend  Donell Beers 08/11/2023, 8:58 AM

## 2023-08-11 NOTE — Progress Notes (Signed)
   08/11/23 0531  15 Minute Checks  Location Bedroom  Visual Appearance Calm  Behavior Sleeping  Sleep (Behavioral Health Patients Only)  Calculate sleep? (Click Yes once per 24 hr at 0600 safety check) Yes  Documented sleep last 24 hours 7.75

## 2023-08-11 NOTE — Progress Notes (Signed)
   08/10/23 2200  Psych Admission Type (Psych Patients Only)  Admission Status Voluntary  Psychosocial Assessment  Patient Complaints Depression  Eye Contact Fair  Facial Expression Flat  Affect Depressed  Speech Slow  Interaction Assertive  Motor Activity Slow  Appearance/Hygiene Unremarkable  Behavior Characteristics Cooperative;Guarded  Mood Depressed  Thought Process  Coherency WDL  Content WDL  Delusions None reported or observed  Perception WDL  Hallucination None reported or observed  Judgment Impaired  Confusion None  Danger to Self  Current suicidal ideation? Denies  Self-Injurious Behavior No self-injurious ideation or behavior indicators observed or expressed   Agreement Not to Harm Self Yes  Description of Agreement verbal  Danger to Others  Danger to Others None reported or observed
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
## 2023-08-11 NOTE — Plan of Care (Signed)
  Problem: Education: Goal: Verbalization of understanding the information provided will improve Outcome: Progressing   Problem: Health Behavior/Discharge Planning: Goal: Compliance with treatment plan for underlying cause of condition will improve Outcome: Progressing   Problem: Safety: Goal: Periods of time without injury will increase Outcome: Progressing   Problem: Coping: Goal: Will verbalize feelings Outcome: Progressing   Problem: Safety: Goal: Ability to disclose and discuss suicidal ideas will improve Outcome: Progressing

## 2023-08-11 NOTE — Group Note (Signed)
Date:  08/11/2023 Time:  11:31 PM  Group Topic/Focus:  Recovery Goals:   The focus of this group is to identify appropriate goals for recovery and establish a plan to achieve them.    Participation Level:  Did Not Attend  Participation Quality:    Affect:    Cognitive:    Insight:   Engagement in Group:    Modes of Intervention:    Additional Comments:    Lashonne Shull 08/11/2023, 11:31 PM

## 2023-08-11 NOTE — [facility] Group Notes (Signed)

## 2023-08-12 DIAGNOSIS — F333 Major depressive disorder, recurrent, severe with psychotic symptoms: Secondary | ICD-10-CM | POA: Diagnosis not present

## 2023-08-12 LAB — LIPID PANEL
Cholesterol: 180 mg/dL (ref 0–200)
HDL: 66 mg/dL (ref >40)
LDL Cholesterol: 98 mg/dL (ref 0–99)
Total CHOL/HDL Ratio: 2.7 {ratio}
Triglycerides: 78 mg/dL (ref <150)
VLDL: 16 mg/dL (ref 0–40)

## 2023-08-12 MED ORDER — RISPERIDONE 1 MG PO TABS
1.0000 mg | ORAL_TABLET | Freq: Once | ORAL | Status: AC
  Administered 2023-08-12: 1 mg via ORAL
  Filled 2023-08-12: qty 1

## 2023-08-12 MED ORDER — SERTRALINE HCL 50 MG PO TABS
50.0000 mg | ORAL_TABLET | Freq: Every day | ORAL | Status: DC
  Administered 2023-08-13 – 2023-08-14 (×2): 50 mg via ORAL
  Filled 2023-08-12 (×4): qty 1

## 2023-08-12 MED ORDER — RISPERIDONE 2 MG PO TABS
2.0000 mg | ORAL_TABLET | Freq: Every day | ORAL | Status: DC
  Administered 2023-08-12 – 2023-08-15 (×4): 2 mg via ORAL
  Filled 2023-08-12 (×5): qty 1

## 2023-08-12 NOTE — Progress Notes (Signed)
   08/12/23 1000  Psych Admission Type (Psych Patients Only)  Admission Status Voluntary  Psychosocial Assessment  Patient Complaints Anxiety;Depression  Eye Contact Fair  Facial Expression Flat  Affect Anxious;Depressed  Speech Slow  Interaction Assertive  Motor Activity Slow  Appearance/Hygiene Unremarkable  Behavior Characteristics Appropriate to situation;Cooperative  Mood Depressed;Anxious  Thought Process  Coherency WDL  Content WDL  Delusions None reported or observed  Perception WDL  Hallucination None reported or observed  Judgment Impaired  Confusion None  Danger to Self  Current suicidal ideation? Denies  Self-Injurious Behavior No self-injurious ideation or behavior indicators observed or expressed   Agreement Not to Harm Self Yes  Description of Agreement Verbal  Danger to Others  Danger to Others None reported or observed

## 2023-08-12 NOTE — Group Note (Signed)
LCSW Group Therapy Note   Group Date: 08/12/2023 Start Time: 1100 End Time: 1200   Type of Therapy and Topic:  Group Therapy - Coping Skills For Anxiety and Depression  Participation Level:  Did Not Attend   Description of Group The focus of this group was to determine what healthy coping techniques would be helpful for group members in coping with anxiety and depression in their daily lives. The group began with patients introducing themselves and revealing one healthy and one unhealthy way they have coped with anxiety or depression in the past. Patients were guided through different techniques for coping with anxiety in a healthy way, including deep breathing, progressive muscle relaxation, challenging irrational thoughts, and mental imagery. Patients were then guided though different techniques for coping with depression in a healthy way, including behavioral activation, increasing social supports, focusing on positive experiences, and mindfulness. Differences between healthy and unhealthy coping techniques were pointed out when brought up by group members. Patients were asked to identify 2-3 healthy coping skills they would like to learn to use more effectively after being guided through these different techniques.These were explained, samples demonstrated, and resources shared for how to learn more at discharge.  Therapeutic Goals Patients learned that coping is what human beings do to deal with various situations in their lives. Patients learned various healthy coping techniques for anxiety and depression Patients determined 2-3 healthy coping skills they would like to become more familiar with and use more often. Patients provided support and ideas to each other.   Summary of Patient Progress:    Did not attend   Therapeutic Modalities Cognitive Behavioral Therapy Motivational Interviewing Dialectical Behavioral Therapy  Marie Appling, LCSW 08/12/2023  1:20 PM
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
## 2023-08-12 NOTE — Progress Notes (Signed)
   08/11/23 2200  Psych Admission Type (Psych Patients Only)  Admission Status Voluntary  Psychosocial Assessment  Patient Complaints Depression  Eye Contact Fair  Facial Expression Flat  Affect Depressed  Speech Slow  Interaction Assertive  Motor Activity Slow  Appearance/Hygiene Unremarkable  Behavior Characteristics Cooperative;Anxious  Mood Depressed;Anxious  Thought Process  Coherency WDL  Content WDL  Delusions None reported or observed  Perception WDL  Hallucination None reported or observed  Judgment Poor  Confusion None  Danger to Self  Current suicidal ideation? Denies  Self-Injurious Behavior No self-injurious ideation or behavior indicators observed or expressed   Agreement Not to Harm Self Yes  Description of Agreement verbal  Danger to Others  Danger to Others None reported or observed

## 2023-08-12 NOTE — Group Note (Signed)
Date:  08/12/2023 Time:  11:53 AM  Group Topic/Focus:  Goals Group:   The focus of this group is to help patients establish daily goals to achieve during treatment and discuss how the patient can incorporate goal setting into their daily lives to aide in recovery. Orientation:   The focus of this group is to educate the patient on the purpose and policies of crisis stabilization and provide a format to answer questions about their admission.  The group details unit policies and expectations of patients while admitted.    Participation Level:  Did Not Attend  Participation Quality:   n/a  Affect:   n/a  Cognitive:   n/a  Insight: None  Engagement in Group:   n/a  Modes of Intervention:   n/a  Additional Comments:   Pt did not attend the Orientation/Goals group.  Edmund Hilda Anjelika Ausburn 08/12/2023, 11:53 AM

## 2023-08-12 NOTE — Progress Notes (Signed)
   08/12/23 0600  15 Minute Checks  Location Bathroom/Shower  Visual Appearance Calm  Behavior Composed  Sleep (Behavioral Health Patients Only)  Calculate sleep? (Click Yes once per 24 hr at 0600 safety check) Yes  Documented sleep last 24 hours 9.25

## 2023-08-12 NOTE — Plan of Care (Signed)
  Problem: Education: Goal: Knowledge of [facility] Education information/materials will improve Outcome: Progressing   Problem: Education: Goal: Mental status will improve Outcome: Progressing   Problem: Coping: Goal: Ability to verbalize frustrations and anger appropriately will improve Outcome: Progressing   Problem: Safety: Goal: Periods of time without injury will increase Outcome: Progressing   Problem: Safety: Goal: Ability to disclose and discuss suicidal ideas will improve Outcome: Progressing

## 2023-08-12 NOTE — Plan of Care (Signed)
  Problem: Education: Goal: Emotional status will improve Outcome: Progressing Goal: Mental status will improve Outcome: Progressing   Problem: Activity: Goal: Interest or engagement in activities will improve Outcome: Progressing Goal: Sleeping patterns will improve Outcome: Progressing

## 2023-08-12 NOTE — Progress Notes (Signed)
[facility] MD Progress Note  08/12/2023 6:37 PM Marie Atkins  MRN:  [id number]  Principal Problem: Severe recurrent major depressive disorder with psychotic features (HCC) Diagnosis: Principal Problem:   Severe recurrent major depressive disorder with psychotic features (HCC)  Reason for Admission:  Marie Atkins is a 52 y.o. female with a past psychiatric history of amphetamine use disorder, Tobacco dependence, Depression and.  Disorder who presented to the [facility] ED with suicidal ideation with plan to OD on Cocaine vs slit her wrist. She was admitted to the [facility] on 9/29.    24 hr Chart Review: SBP slightly elevated. Patient is compliant with routine medication regimen without difficulty. Sleep hours last night: 9.25 hours, as documented in the nursing flow sheets. No behavioral episodes or nursing concerns were reported at this time. PRN Tylenol administered for Left hip pain, according to the nursing record.    On today's assessment, the patient reports decreased energy level "due to the medications." She reports missing lunch today because she felt tired and mentioned her sleep was disrupted last night. She noted the onset of nightmares two days ago, with the most recent occurring last night. She stated that she is in the hospital due to depression, identifying her ex-partner and people in general as her main stressors. She also mentioned being previously prescribed Abilify at [facility] for bipolar disorder, though she last took it 2-3 weeks ago and is unable to recall the dosage. The patient reported attending and actively participating in group sessions. She rated her anxiety as 4 and depression as a 6 out of 10, with 10 being the most severe. Her appetite is reported as "okay." She denied active suicidal thoughts, plans, or intent, as well as any homicidal ideation. Additionally, she denies auditory or visual hallucinations, thought insertion, or paranoia.  Communication during the encounter was clear, with no  signs distractibility or preoccupation.      No signs of TD (Tardive Dyskinesia) or EPS (Extrapyramidal Symptoms) were observed during the assessment, and the patient reports no feelings of stiffness. AIMS score: 0.   Continued hospitalization remains necessary at this time, to treat and stabilize mental status prior to discharge.     We discussed changes to the current medication regimen: -- Discontinue Fluoxetine 40 mg daily  -- Discontinue Risperdal 1 mg BID --Start Risperdal 2 mg at bedtime  --Start Sertraline 50 mg daily      Total Time spent with patient: 30 minutes  Past Psychiatric History: See H&P   Past Medical History:  Past Medical History:  Diagnosis Date   Cocaine abuse (HCC)    Depression    HTN (hypertension)    Iron deficiency anemia     Past Surgical History:  Procedure Laterality Date   TUBAL LIGATION     Family History:  Family History  Problem Relation Age of Onset   Hypertension Mother    Diabetes Mother    CVA Mother    Heart attack Mother    Colon cancer Maternal Uncle    Family Psychiatric  History: See H&P  Social History:  Social History   Substance and Sexual Activity  Alcohol Use Yes   Alcohol/week: 42.0 standard drinks of alcohol   Types: 42 Cans of beer per week     Social History   Substance and Sexual Activity  Drug Use Yes   Types: Cocaine, "Crack" cocaine    Social History   Socioeconomic History   Marital status: Single    Spouse name: Not on  file   Number of children: Not on file   Years of education: Not on file   Highest education level: Not on file  Occupational History   Not on file  Tobacco Use   Smoking status: Every Day    Current packs/day: 0.25    Average packs/day: 0.3 packs/day for 36.0 years (9.0 ttl pk-yrs)    Types: Cigarettes   Smokeless tobacco: Never   Tobacco comments:    10 cigarettes/day  Vaping Use   Vaping status: Never Used  Substance and Sexual Activity   Alcohol use: Yes     Alcohol/week: 42.0 standard drinks of alcohol    Types: 42 Cans of beer per week   Drug use: Yes    Types: Cocaine, "Crack" cocaine   Sexual activity: Yes  Other Topics Concern   Not on file  Social History Narrative   Not on file   Social Determinants of Health   Financial Resource Strain: Not on File (02/28/2022)   Received from [facility], [place]   Financial Resource Strain    Financial Resource Strain: 0  Food Insecurity: Food Insecurity Present (08/10/2023)   Hunger Vital Sign    Worried About Running Out of Food in the Last Year: Sometimes true    Ran Out of Food in the Last Year: Sometimes true  Transportation Needs: Unmet Transportation Needs (08/10/2023)   PRAPARE - Administrator, Civil Service (Medical): Yes    Lack of Transportation (Non-Medical): Yes  Physical Activity: Not on File (02/28/2022)   Received from [place], [place]   Physical Activity    Physical Activity: 0  Stress: Not on File (02/28/2022)   Received from [facility], [place]   Stress    Stress: 0  Social Connections: Not on File (07/26/2023)   Received from [facility]   Social Connections    Connectedness: 0   Additional Social History:                         Sleep: Fair  Appetite:  Fair  Current Medications: Current Facility-Administered Medications  Medication Dose Route Frequency Provider Last Rate Last Admin   acetaminophen (TYLENOL) tablet 650 mg  650 mg Oral Q6H PRN [name] C, NP   650 mg at 08/11/23 0803   alum & mag hydroxide-simeth (MAALOX/MYLANTA) 200-200-20 MG/5ML suspension 30 mL  30 mL Oral Q4H PRN [name] C, NP       diphenhydrAMINE (BENADRYL) capsule 50 mg  50 mg Oral TID PRN [name] C, NP       Or   diphenhydrAMINE (BENADRYL) injection 50 mg  50 mg Intramuscular TID PRN [name] C, NP       gabapentin (NEURONTIN) capsule 300 mg  300 mg Oral TID [name] C, NP   300 mg at 08/12/23 1726   haloperidol (HALDOL) tablet 5 mg  5 mg Oral  TID PRN [name] C, NP       Or   haloperidol lactate (HALDOL) injection 5 mg  5 mg Intramuscular TID PRN [name] C, NP       ibuprofen (ADVIL) tablet 400 mg  400 mg Oral Q6H Peterson Ao, MD   400 mg at 08/12/23 1346   influenza vac split trivalent PF (FLULAVAL) injection 0.5 mL  0.5 mL Intramuscular Tomorrow-1000 [place], Harrold Donath, MD       levothyroxine (SYNTHROID) tablet 50 mcg  50 mcg Oral Q0600 Earney Navy, NP   50 mcg  at 08/12/23 0648   LORazepam (ATIVAN) tablet 2 mg  2 mg Oral TID PRN Earney Navy, NP       Or   LORazepam (ATIVAN) injection 2 mg  2 mg Intramuscular TID PRN Earney Navy, NP       losartan (COZAAR) tablet 25 mg  25 mg Oral Daily Starleen Blue, NP   25 mg at 08/12/23 5409   magnesium hydroxide (MILK OF MAGNESIA) suspension 15 mL  15 mL Oral Daily PRN [name] C, NP       nicotine (NICODERM CQ - dosed in mg/24 hours) patch 14 mg  14 mg Transdermal Daily Peterson Ao, MD       nicotine polacrilex (NICORETTE) gum 2 mg  2 mg Oral PRN Massengill, Harrold Donath, MD       pantoprazole (PROTONIX) EC tablet 40 mg  40 mg Oral Daily [name] C, NP   40 mg at 08/12/23 8119   pneumococcal 20-valent conjugate vaccine (PREVNAR 20) injection 0.5 mL  0.5 mL Intramuscular Tomorrow-1000 Massengill, Harrold Donath, MD       risperiDONE (RISPERDAL) tablet 1 mg  1 mg Oral Once Toney Difatta H, NP       risperiDONE (RISPERDAL) tablet 2 mg  2 mg Oral QHS Ivana Nicastro H, NP       [START ON 08/13/2023] sertraline (ZOLOFT) tablet 50 mg  50 mg Oral Daily Delaynie Stetzer H, NP       traZODone (DESYREL) tablet 50 mg  50 mg Oral QHS PRN Peterson Ao, MD        Lab Results:  Results for orders placed or performed during the hospital encounter of 08/10/23 (from the past 48 hour(s))  TSH     Status: None   Collection Time: 08/11/23  6:29 AM  Result Value Ref Range   TSH 2.951 0.350 - 4.500 uIU/mL    Comment: Performed by a 3rd Generation  assay with a functional sensitivity of <=0.01 uIU/mL. Performed at [facility], [address], [place], [place] [address]   Lipid panel     Status: None   Collection Time: 08/12/23  6:44 AM  Result Value Ref Range   Cholesterol 180 0 - 200 mg/dL   Triglycerides 78 <956 mg/dL   HDL 66 >21 mg/dL   Total CHOL/HDL Ratio 2.7 RATIO   VLDL 16 0 - 40 mg/dL   LDL Cholesterol 98 0 - 99 mg/dL    Comment:        Total Cholesterol/HDL:CHD Risk Coronary Heart Disease Risk Table                     Men   Women  1/2 Average Risk   3.4   3.3  Average Risk       5.0   4.4  2 X Average Risk   9.6   7.1  3 X Average Risk  23.4   11.0        Use the calculated Patient Ratio above and the CHD Risk Table to determine the patient's CHD Risk.        ATP III CLASSIFICATION (LDL):  <100     mg/dL   Optimal  308-657  mg/dL   Near or Above                    Optimal  130-159  mg/dL   Borderline  846-962  mg/dL   High  >952  mg/dL   Very High Performed at [facility], [address], [place], [place] [address]     Blood Alcohol level:  Lab Results  Component Value Date   ETH <10 08/09/2023   ETH <10 06/23/2020    Metabolic Disorder Labs: Lab Results  Component Value Date   HGBA1C 4.8 03/05/2023   MPG 91 03/05/2023   MPG 100 06/24/2020   No results found for: "PROLACTIN" Lab Results  Component Value Date   CHOL 180 08/12/2023   TRIG 78 08/12/2023   HDL 66 08/12/2023   CHOLHDL 2.7 08/12/2023   VLDL 16 08/12/2023   LDLCALC 98 08/12/2023   LDLCALC 98 03/05/2023    Physical Findings: AIMS:  , ,  ,  ,    CIWA:    COWS:     Musculoskeletal: Strength & Muscle Tone: within normal limits Gait & Station: normal Patient leans: N/A  Psychiatric Specialty Exam:  Presentation  General Appearance:  Casual  Eye Contact: Good  Speech: Clear and Coherent  Speech Volume: Normal  Handedness: Right   Mood and Affect   Mood: Depressed; Hopeless  Affect: Blunt; Congruent   Thought Process  Thought Processes: Coherent  Descriptions of Associations:Intact  Orientation:Full (Time, Place and Person)  Thought Content:Logical; WDL  History of Schizophrenia/Schizoaffective disorder:No  Duration of Psychotic Symptoms:N/A  Hallucinations:Hallucinations: None Description of Auditory Hallucinations: whispers (Most recently experienced Friday)  Ideas of Reference:None  Suicidal Thoughts:Suicidal Thoughts: No SI Passive Intent and/or Plan: With Intent; With Plan  Homicidal Thoughts:Homicidal Thoughts: No   Sensorium  Memory: Immediate Fair; Recent Fair  Judgment: Fair  Insight: Fair   Executive Functions  Concentration: Good  Attention Span: Good  Recall: Fair  Fund of Knowledge: Fair  Language: Good   Psychomotor Activity  Psychomotor Activity: Psychomotor Activity: Normal   Assets  Assets: Communication Skills; Desire for Improvement   Sleep  Sleep: Sleep: Fair Number of Hours of Sleep: 7.75    Physical Exam: Physical Exam Vitals and nursing note reviewed.  Constitutional:      General: She is not in acute distress. HENT:     Head: Normocephalic.     Nose: Nose normal.  Pulmonary:     Effort: Pulmonary effort is normal. No respiratory distress.  Musculoskeletal:        General: Normal range of motion.     Cervical back: Normal range of motion.  Neurological:     Mental Status: She is alert and oriented to person, place, and time.    Review of Systems  Constitutional:  Positive for malaise/fatigue. Negative for fever.  Respiratory:  Negative for wheezing.   Cardiovascular:  Negative for chest pain.  Neurological:  Negative for tremors.  Psychiatric/Behavioral:  Positive for depression, substance abuse and suicidal ideas. The patient is nervous/anxious and has insomnia.   All other systems reviewed and are negative.  Blood pressure (!)  140/82, pulse 67, temperature 98.5 F (36.9 C), temperature source Oral, resp. rate 18, height 5\' 4"  (1.626 m), weight 82.4 kg, SpO2 99%. Body mass index is 31.17 kg/m.  Diagnoses / Active Problems:   Major Depressive Disorder, Recurrent, Severe, w psychotic features  Suicidal Ideations Cocaine Use Disorder  Alcohol Use Disorder  Tobacco Use Disorder    PLAN: Safety and Monitoring:             --  Voluntary admission to inpatient psychiatric unit for safety, stabilization and treatment             --  Daily contact with patient to assess and evaluate symptoms and progress in treatment             -- Patient's case to be discussed in multi-disciplinary team meeting             -- Observation Level : q15 minute checks             -- Vital signs:  q12 hours             -- Precautions: suicide, elopement, and assault   2. Psychiatric Diagnoses and Treatment:              MDD w/ psychotic features  -- Discontinue Fluoxetine 40 mg daily  -- Discontinue Risperdal 1 mg BID --Start Risperdal 2 mg at bedtime  --Start Sertraline 50 mg daily    -- The risks/benefits/side-effects/alternatives to this medication were discussed in detail with the patient and time was given for questions. The patient consents to medication trial.              -- Metabolic profile and EKG monitoring obtained while on an atypical antipsychotic (BMI: Lipid Panel: HbgA1c: QTc:) pending              -- Encouraged patient to participate in unit milieu and in scheduled group therapies              -- Short Term Goals: Ability to identify changes in lifestyle to reduce recurrence of condition will improve, Ability to verbalize feelings will improve, Ability to disclose and discuss suicidal ideas, Ability to demonstrate self-control will improve, Ability to identify and develop effective coping behaviors will improve, Ability to maintain clinical measurements within normal limits will improve, Compliance with prescribed  medications will improve, and Ability to identify triggers associated with substance abuse/mental health issues will improve             -- Long Term Goals: Improvement in symptoms so as ready for discharge   3. Medical Issues Being Addressed:              Neuropathic pain -- Gabapentin 300 mg TID  GERD -- Protonix 40 mg daily  Hypothyroidism:  -- synthroid 50 mcg   Tobacco Use Disorder             -- Nicotine patch 14mg /24 hours ordered             -- Smoking cessation encouraged   4. Discharge Planning:              -- Social work and case management to assist with discharge planning and identification of hospital follow-up needs prior to discharge             -- Estimated LOS: 5-7 days             -- Discharge Concerns: Need to establish a safety plan; Medication compliance and effectiveness             -- Discharge Goals: Return home with outpatient referrals for mental health follow-up including medication management/psychotherapy      Norma Fredrickson, NP 08/12/2023, 6:37 PM  Total Time Spent in Direct Patient Care:  I personally spent 35 minutes on the unit in direct patient care. The direct patient care time included face-to-face time with the patient, reviewing the patient's chart, communicating with other professionals, and coordinating care. Greater than 50% of this time was spent in counseling or coordinating care with the patient regarding goals of hospitalization,  psycho-education, and discharge planning needs.

## 2023-08-12 NOTE — Group Note (Signed)
Recreation Therapy Group Note   Group Topic:Animal Assisted Therapy   Group Date: 08/12/2023 Start Time: 0946 End Time: 1030 Facilitators: Taya Ashbaugh-McCall, LRT,CTRS Location: [address] Dayroom   Animal-Assisted Activity (AAA) Program Checklist/Progress Notes Patient Eligibility Criteria Checklist & Daily Group note for Rec Tx Intervention  AAA/T Program Assumption of Risk Form signed by Patient/ or Parent Legal Guardian Yes  Patient understands his/her participation is voluntary Yes   Affect/Mood: N/A   Participation Level: Did not attend    Clinical Observations/Individualized Feedback:     Plan: Continue to engage patient in RT group sessions 2-3x/week.   Juley Giovanetti-McCall, LRT,CTRS  08/12/2023 12:47 PM

## 2023-08-12 NOTE — [facility] Suicide Risk Assessment (Signed)
[facility] INPATIENT:  Family/Significant Other Suicide Prevention Education  Suicide Prevention Education:  Patient Refusal for Family/Significant Other Suicide Prevention Education: The patient Marie Atkins has refused to provide written consent for family/significant other to be provided Family/Significant Other Suicide Prevention Education during admission and/or prior to discharge.  Physician notified.  Mylee Falin S Rex Oesterle 08/12/2023, 2:13 PM

## 2023-08-12 NOTE — Progress Notes (Signed)
   08/12/23 2229  Psych Admission Type (Psych Patients Only)  Admission Status Voluntary  Psychosocial Assessment  Patient Complaints Anxiety;Depression  Eye Contact Fair  Facial Expression Flat  Affect Anxious;Depressed  Speech Slow  Interaction Assertive  Motor Activity Slow  Appearance/Hygiene Unremarkable  Behavior Characteristics Cooperative;Appropriate to situation  Mood Pleasant  Thought Process  Coherency WDL  Content WDL  Delusions None reported or observed  Perception WDL  Hallucination None reported or observed  Judgment Impaired  Confusion None  Danger to Self  Current suicidal ideation? Denies  Self-Injurious Behavior No self-injurious ideation or behavior indicators observed or expressed   Agreement Not to Harm Self Yes  Description of Agreement verbal  Danger to Others  Danger to Others None reported or observed

## 2023-08-12 NOTE — Group Note (Unsigned)
Date:  08/12/2023 Time:  5:19 PM  Group Topic/Focus:  Goals Group:   The focus of this group is to help patients establish daily goals to achieve during treatment and discuss how the patient can incorporate goal setting into their daily lives to aide in recovery. Orientation:   The focus of this group is to educate the patient on the purpose and policies of crisis stabilization and provide a format to answer questions about their admission.  The group details unit policies and expectations of patients while admitted.     Participation Level:  {[facility] PARTICIPATION UEAVW:09811}  Participation Quality:  {[facility] PARTICIPATION QUALITY:22265}  Affect:  {[facility] AFFECT:22266}  Cognitive:  {[facility] COGNITIVE:22267}  Insight: {[facility] Insight2:20797}  Engagement in Group:  {[facility] ENGAGEMENT IN BJYNW:29562}  Modes of Intervention:  {[facility] MODES OF INTERVENTION:22269}  Additional Comments:  ***  Raylyn Carton M Anagha Loseke 08/12/2023, 5:19 PM

## 2023-08-12 NOTE — [facility] Group Notes (Signed)
Adult Psychoeducational Group Note  Date:  08/12/2023 Time:  9:21 PM  Group Topic/Focus:  Wrap-Up Group:   The focus of this group is to help patients review their daily goal of treatment and discuss progress on daily workbooks.  Participation Level:  Active  Participation Quality:  Appropriate  Affect:  Appropriate  Cognitive:  Appropriate  Insight: Appropriate  Engagement in Group:  Engaged  Modes of Intervention:  Discussion and Support  Additional Comments:  Pt told that today was an "okay" day on the unit, the highlight of which was simply waking up this morning. On the subject of staying well upon discharge, Pt mentioned wanting to re-try outpatient therapy while also utilizing support from her family and friends. Pt rated her day a 4 out of 10.  Christ Kick 08/12/2023, 9:21 PM

## 2023-08-13 DIAGNOSIS — F333 Major depressive disorder, recurrent, severe with psychotic symptoms: Secondary | ICD-10-CM

## 2023-08-13 LAB — HEMOGLOBIN A1C
Hgb A1c MFr Bld: 5.3 % (ref 4.8–5.6)
Mean Plasma Glucose: 105 mg/dL

## 2023-08-13 NOTE — Progress Notes (Signed)
[facility] MD Progress Note  08/13/2023 3:13 PM Marie Atkins  MRN:  [id number]  Principal Problem: Severe recurrent major depressive disorder with psychotic features (HCC) Diagnosis: Principal Problem:   Severe recurrent major depressive disorder with psychotic features (HCC)  Reason for Admission:  Marie Atkins is a 52 y.o. female with a past psychiatric history of amphetamine use disorder, Tobacco dependence, Depression and.  Disorder who presented to the [facility] ED with suicidal ideation with plan to OD on Cocaine vs slit her wrist. She was admitted to the [facility] on 9/29.    Yesterday the psychiatry team made the following recommendations: -- Discontinue Fluoxetine 40 mg daily  -- Discontinue Risperdal 1 mg BID --Start Risperdal 2 mg at bedtime  --Start Sertraline 50 mg daily   On assessment today, the pt reports that their mood is still depressed and sad, unchanged since yesterday.  Reports that anxiety is still elevated, generalized. Sleep is better. Appetite is okay. Concentration is better and feels less fuzzy during the day now that risperdal is being consolidated to at bedtime.   Energy level is better. Denies having any suicidal thoughts, which is an improvement. Denies having any suicidal intent and plan.  Denies having any HI.  Denies having psychotic symptoms.   Denies having side effects to current psychiatric medications.   Discussed that we made multiple med changes yesterday and we will not change medication today to monitor responses to those changes yesterday - pt is agreeable.     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, medical use disorder, alcohol use disorder, cocaine use disorder Prior inpatient treatment: [facility] April 2024, and [facility] May 2024 Current/prior outpatient  treatment: Fluoxetine 20 mg daily Prior rehab hx: [facility] rehabilitation in May 2024 Psychotherapy hx: Previously  History of suicide: Prior attempt in April 2024  History of homicide or aggression: Denies Psychiatric medication history: fluoxetine, risperidone  Psychiatric medication compliance history: Neuromodulation history:  Current Psychiatrist: Current therapist:     Past Medical History:  Past Medical History:  Diagnosis Date   Cocaine abuse (HCC)    Depression    HTN (hypertension)    Iron deficiency anemia     Past Surgical History:  Procedure Laterality Date   TUBAL LIGATION     Family History:  Family History  Problem Relation Age of Onset   Hypertension Mother    Diabetes Mother    CVA Mother    Heart attack Mother    Colon cancer Maternal Uncle    Family Psychiatric  History: See H&P  Social History:  Social History   Substance and Sexual Activity  Alcohol Use Yes   Alcohol/week: 42.0 Atkins drinks of alcohol   Types: 42 Cans of beer per week     Social History   Substance and Sexual Activity  Drug Use Yes   Types: Cocaine, "Crack" cocaine    Social History   Socioeconomic History   Marital status: Single    Spouse name: Not on file   Number of children: Not on file   Years of education: Not on file   Highest education level: Not on file  Occupational History   Not on file  Tobacco Use   Smoking status: Every Day    Current packs/day: 0.25    Average packs/day: 0.3 packs/day for 36.0 years (9.0 ttl pk-yrs)  Types: Cigarettes   Smokeless tobacco: Never   Tobacco comments:    10 cigarettes/day  Vaping Use   Vaping status: Never Used  Substance and Sexual Activity   Alcohol use: Yes    Alcohol/week: 42.0 Atkins drinks of alcohol    Types: 42 Cans of beer per week   Drug use: Yes    Types: Cocaine, "Crack" cocaine   Sexual activity: Yes  Other Topics Concern   Not on file  Social History Narrative   Not on file    Social Determinants of Health   Financial Resource Strain: Not on File (02/28/2022)   Received from [employer] [facility], [place]   Financial Resource Strain    Financial Resource Strain: 0  Food Insecurity: Food Insecurity Present (08/10/2023)   Hunger Vital Sign    Worried About Running Out of Food in the Last Year: Sometimes true    Ran Out of Food in the Last Year: Sometimes true  Transportation Needs: Unmet Transportation Needs (08/10/2023)   PRAPARE - Administrator, Civil Service (Medical): Yes    Lack of Transportation (Non-Medical): Yes  Physical Activity: Not on File (02/28/2022)   Received from [place], [place]   Physical Activity    Physical Activity: 0  Stress: Not on File (02/28/2022)   Received from [facility], [place]   Stress    Stress: 0  Social Connections: Not on File (07/26/2023)   Received from [facility]   Social Connections    Connectedness: 0   Additional Social History:                           Current Medications: Current Facility-Administered Medications  Medication Dose Route Frequency Provider Last Rate Last Admin   acetaminophen (TYLENOL) tablet 650 mg  650 mg Oral Q6H PRN [name] C, NP   650 mg at 08/11/23 0803   alum & mag hydroxide-simeth (MAALOX/MYLANTA) 200-200-20 MG/5ML suspension 30 mL  30 mL Oral Q4H PRN [name] C, NP       diphenhydrAMINE (BENADRYL) capsule 50 mg  50 mg Oral TID PRN [name] C, NP       Or   diphenhydrAMINE (BENADRYL) injection 50 mg  50 mg Intramuscular TID PRN [name] C, NP       gabapentin (NEURONTIN) capsule 300 mg  300 mg Oral TID [name] C, NP   300 mg at 08/13/23 1211   haloperidol (HALDOL) tablet 5 mg  5 mg Oral TID PRN [name] C, NP       Or   haloperidol lactate (HALDOL) injection 5 mg  5 mg Intramuscular TID PRN [name] C, NP       ibuprofen (ADVIL) tablet 400 mg  400 mg Oral Q6H Peterson Ao, MD   400 mg at 08/13/23 1406   influenza vac split  trivalent PF (FLULAVAL) injection 0.5 mL  0.5 mL Intramuscular Tomorrow-1000 Emory Leaver, Harrold Donath, MD       levothyroxine (SYNTHROID) tablet 50 mcg  50 mcg Oral Q0600 [name] C, NP   50 mcg at 08/13/23 0350   LORazepam (ATIVAN) tablet 2 mg  2 mg Oral TID PRN Earney Navy, NP       Or   LORazepam (ATIVAN) injection 2 mg  2 mg Intramuscular TID PRN Earney Navy, NP       losartan (COZAAR) tablet 25 mg  25 mg Oral Daily Starleen Blue, NP   25 mg at 08/13/23  1610   magnesium hydroxide (MILK OF MAGNESIA) suspension 15 mL  15 mL Oral Daily PRN [name] C, NP   15 mL at 08/13/23 9604   nicotine (NICODERM CQ - dosed in mg/24 hours) patch 14 mg  14 mg Transdermal Daily Peterson Ao, MD       nicotine polacrilex (NICORETTE) gum 2 mg  2 mg Oral PRN Tapanga Ottaway, Harrold Donath, MD       pantoprazole (PROTONIX) EC tablet 40 mg  40 mg Oral Daily [name] C, NP   40 mg at 08/13/23 5409   pneumococcal 20-valent conjugate vaccine (PREVNAR 20) injection 0.5 mL  0.5 mL Intramuscular Tomorrow-1000 Maryclaire Stoecker, Harrold Donath, MD       risperiDONE (RISPERDAL) tablet 2 mg  2 mg Oral QHS Bennett, Christal H, NP   2 mg at 08/12/23 2109   sertraline (ZOLOFT) tablet 50 mg  50 mg Oral Daily Bennett, Christal H, NP   50 mg at 08/13/23 8119   traZODone (DESYREL) tablet 50 mg  50 mg Oral QHS PRN Peterson Ao, MD        Lab Results:  Results for orders placed or performed during the hospital encounter of 08/10/23 (from the past 48 hour(s))  Lipid panel     Status: None   Collection Time: 08/12/23  6:44 AM  Result Value Ref Range   Cholesterol 180 0 - 200 mg/dL   Triglycerides 78 <147 mg/dL   HDL 66 >82 mg/dL   Total CHOL/HDL Ratio 2.7 RATIO   VLDL 16 0 - 40 mg/dL   LDL Cholesterol 98 0 - 99 mg/dL    Comment:        Total Cholesterol/HDL:CHD Risk Coronary Heart Disease Risk Table                     Men   Women  1/2 Average Risk   3.4   3.3  Average Risk       5.0   4.4  2 X Average  Risk   9.6   7.1  3 X Average Risk  23.4   11.0        Use the calculated Patient Ratio above and the CHD Risk Table to determine the patient's CHD Risk.        ATP III CLASSIFICATION (LDL):  <100     mg/dL   Optimal  956-213  mg/dL   Near or Above                    Optimal  130-159  mg/dL   Borderline  086-578  mg/dL   High  >469     mg/dL   Very High Performed at [facility], [address], [place], [place] [address]   Hemoglobin A1c     Status: None   Collection Time: 08/12/23  6:44 AM  Result Value Ref Range   Hgb A1c MFr Bld 5.3 4.8 - 5.6 %    Comment: (NOTE)         Prediabetes: 5.7 - 6.4         Diabetes: >6.4         Glycemic control for adults with diabetes: <7.0    Mean Plasma Glucose 105 mg/dL    Comment: (NOTE) Performed At: [facility] [address] [place], [place] [id number] Jolene Schimke MD UU:[id number]     Blood Alcohol level:  Lab Results  Component Value Date   [facility] <10 08/09/2023  ETH <10 06/23/2020    Metabolic Disorder Labs: Lab Results  Component Value Date   HGBA1C 5.3 08/12/2023   MPG 105 08/12/2023   MPG 91 03/05/2023   No results found for: "PROLACTIN" Lab Results  Component Value Date   CHOL 180 08/12/2023   TRIG 78 08/12/2023   HDL 66 08/12/2023   CHOLHDL 2.7 08/12/2023   VLDL 16 08/12/2023   LDLCALC 98 08/12/2023   LDLCALC 98 03/05/2023    Physical Findings: AIMS:  , ,  ,  ,    CIWA:    COWS:     Musculoskeletal: Strength & Muscle Tone: within normal limits Gait & Station: normal Patient leans: N/A  Psychiatric Specialty Exam:  Presentation  General Appearance:  Disheveled  Eye Contact: Fair  Speech: Normal Rate  Speech Volume: Decreased  Handedness: Right   Mood and Affect  Mood: Anxious; Depressed  Affect: Constricted; Depressed   Thought Process  Thought Processes: Linear  Descriptions of Associations:Intact  Orientation:Full (Time, Place and  Person)  Thought Content:Logical  History of Schizophrenia/Schizoaffective disorder:No  Duration of Psychotic Symptoms:Less than six months  Hallucinations:Hallucinations: Auditory  Ideas of Reference:None  Suicidal Thoughts:Suicidal Thoughts: No  Homicidal Thoughts:Homicidal Thoughts: No   Sensorium  Memory: Recent Good; Remote Good; Immediate Good  Judgment: Fair  Insight: Fair   Chartered certified accountant: Fair  Attention Span: Fair  Recall: Good  Fund of Knowledge: Good  Language: Good   Psychomotor Activity  Psychomotor Activity: Psychomotor Activity: Normal   Assets  Assets: Communication Skills; Desire for Improvement   Sleep  Sleep: Sleep: Fair    Physical Exam: Physical Exam Vitals and nursing note reviewed.  Constitutional:      General: She is not in acute distress. HENT:     Head: Normocephalic.     Nose: Nose normal.  Pulmonary:     Effort: Pulmonary effort is normal. No respiratory distress.  Musculoskeletal:        General: Normal range of motion.     Cervical back: Normal range of motion.  Neurological:     Mental Status: She is alert and oriented to person, place, and time.    Review of Systems  Constitutional:  Positive for malaise/fatigue. Negative for fever.  Respiratory:  Negative for wheezing.   Cardiovascular:  Negative for chest pain.  Neurological:  Negative for tremors.  Psychiatric/Behavioral:  Positive for depression, substance abuse and suicidal ideas. The patient is nervous/anxious and has insomnia.   All other systems reviewed and are negative.  Blood pressure (!) 140/99, pulse 97, temperature 98.3 F (36.8 C), temperature source Oral, resp. rate 16, height 5\' 4"  (1.626 m), weight 82.4 kg, SpO2 100%. Body mass index is 31.17 kg/m.  Diagnoses / Active Problems:   Major Depressive Disorder, Recurrent, Severe, w psychotic features  GAD Stimulant  Use Disorder  Alcohol Use Disorder   Tobacco Use Disorder    PLAN: Safety and Monitoring:             --  Voluntary admission to inpatient psychiatric unit for safety, stabilization and treatment             -- Daily contact with patient to assess and evaluate symptoms and progress in treatment             -- Patient's case to be discussed in multi-disciplinary team meeting             -- Observation Level : q15 minute checks             --  Vital signs:  q12 hours             -- Precautions: suicide, elopement, and assault   2. Psychiatric Diagnoses and Treatment:  -continue risperdal 2 mg at bedtime for psychotic symptoms of mdd  -continue sertraline 50 mg every day for mdd  (Previously dc prozac due to ineffectiveness)      -- The risks/benefits/side-effects/alternatives to this medication were discussed in detail with the patient and time was given for questions. The patient consents to medication trial.              -- Metabolic profile and EKG monitoring obtained while on an atypical antipsychotic (BMI: Lipid Panel: HbgA1c: QTc:) pending              -- Encouraged patient to participate in unit milieu and in scheduled group therapies             3. Medical Issues Being Addressed:              Neuropathic pain -- Gabapentin 300 mg TID  GERD -- Protonix 40 mg daily  Hypothyroidism:  -- synthroid 50 mcg   Tobacco Use Disorder             -- Nicotine patch 14mg /24 hours ordered             -- Smoking cessation encouraged   4. Discharge Planning:              -- Social work and case management to assist with discharge planning and identification of hospital follow-up needs prior to discharge             -- Estimated LOS: 5-6 more days             -- Discharge Concerns: Need to establish a safety plan; Medication compliance and effectiveness             -- Discharge Goals: Return home with outpatient referrals for mental health follow-up including medication management/psychotherapy      Cristy Hilts, MD 08/13/2023, 3:13 PM  Total Time Spent in Direct Patient Care:  I personally spent 25 minutes on the unit in direct patient care. The direct patient care time included face-to-face time with the patient, reviewing the patient's chart, communicating with other professionals, and coordinating care. Greater than 50% of this time was spent in counseling or coordinating care with the patient regarding goals of hospitalization, psycho-education, and discharge planning needs.

## 2023-08-13 NOTE — [facility] Group Notes (Signed)
Spiritual care group facilitated by Chaplain Dyanne Carrel, [facility]  Group focused on topic of strength. Group members reflected on what thoughts and feelings emerge when they hear this topic. They then engaged in facilitated dialog around how strength is present in their lives. This dialog focused on representing what strength had been to them in their lives (images and patterns given) and what they saw as helpful in their life now (what they needed / wanted).  Activity drew on narrative framework.  Patient Progress: Marie Atkins attended group and actively engaged and participated in group conversation and activities.

## 2023-08-13 NOTE — B[facility] Group Notes (Signed)
Adult Psychoeducational Group Note  Date:  08/13/2023 Time:  10:47 PM  Group Topic/Focus:  Narcotics Anonymos  Participation Level:  Active  Participation Quality:  Appropriate  Affect:  Appropriate  Cognitive:  Appropriate  Insight: Appropriate  Engagement in Group:  Engaged  Modes of Intervention:  Education  Additional Comments:  Pt attend NA group and listen to the speakers as they share their stories. Pt participated in the serenity prayer as well.  Amjad Fikes, Sharen Counter 08/13/2023, 10:47 PM

## 2023-08-13 NOTE — Progress Notes (Signed)
   08/13/23 2112  Psych Admission Type (Psych Patients Only)  Admission Status Voluntary  Psychosocial Assessment  Patient Complaints Depression  Eye Contact Fair  Facial Expression Flat  Affect Depressed  Speech Slow  Interaction Assertive  Motor Activity Slow  Appearance/Hygiene Unremarkable  Behavior Characteristics Calm  Mood Depressed  Thought Process  Coherency WDL  Content WDL  Delusions None reported or observed  Perception WDL  Hallucination None reported or observed  Judgment Impaired  Confusion None  Danger to Self  Current suicidal ideation? Passive  Self-Injurious Behavior No self-injurious ideation or behavior indicators observed or expressed   Agreement Not to Harm Self Yes  Description of Agreement Verbal  Danger to Others  Danger to Others None reported or observed

## 2023-08-13 NOTE — Progress Notes (Signed)
   08/13/23 0810  Psych Admission Type (Psych Patients Only)  Admission Status Voluntary  Psychosocial Assessment  Patient Complaints Depression  Eye Contact Fair  Facial Expression Flat  Affect Anxious;Depressed  Speech Slow  Interaction Assertive  Motor Activity Slow  Appearance/Hygiene Unremarkable  Behavior Characteristics Cooperative;Appropriate to situation  Mood Anxious;Depressed  Thought Process  Coherency WDL  Content WDL  Delusions None reported or observed  Perception WDL  Hallucination None reported or observed  Judgment Impaired  Confusion None  Danger to Self  Current suicidal ideation? Denies  Self-Injurious Behavior No self-injurious ideation or behavior indicators observed or expressed   Agreement Not to Harm Self Yes  Description of Agreement Verbal  Danger to Others  Danger to Others None reported or observed

## 2023-08-13 NOTE — Plan of Care (Signed)
°  Problem: Education: °Goal: Emotional status will improve °Outcome: Progressing °Goal: Mental status will improve °Outcome: Progressing °Goal: Verbalization of understanding the information provided will improve °Outcome: Progressing °  °

## 2023-08-13 NOTE — Group Note (Signed)
Recreation Therapy Group Note   Group Topic:Other  Group Date: 08/13/2023 Start Time: 1405 End Time: 1450 Facilitators: Einer Meals-McCall, LRT,CTRS Location: [address] Dayroom   Activity Description/Intervention: Therapeutic Drumming. Patients with peers and staff were given the opportunity to engage in a leader facilitated [facility] Empowerment Drumming Circle with staff from the [employer], in partnership with [employer]. Teaching laboratory technician and trained [employer], Theodoro Doing leading with LRT observing and documenting intervention and pt response. This evidenced-based practice targets 7 areas of health and wellbeing in the human experience including: stress-reduction, exercise, self-expression, camaraderie/support, nurturing, spirituality, and music-making (leisure).   Goal Area(s) Addresses:  Patient will engage in pro-social way in music group.  Patient will follow directions of drum leader on the first prompt. Patient will demonstrate no behavioral issues during group.  Patient will identify if a reduction in stress level occurs as a result of participation in therapeutic drum circle.    Education: Leisure exposure, Coping skills, Musical expression, Discharge Planning  Natasja actively engaged in therapeutic drumming exercise and discussions. Pt was appropriate with peers, staff, and musical equipment for duration of programming.  Pt identified "safe" as their feeling after participation in music-based programming. Pt affect congruent/incongruent with verbalized emotion.    Affect/Mood: Appropriate   Participation Level: Engaged   Participation Quality: Independent   Behavior: Appropriate   Speech/Thought Process: Focused   Insight: Good   Judgement: Good   Modes of Intervention: Teaching laboratory technician   Patient Response to Interventions:  Engaged   Education Outcome:  In group clarification offered    Clinical  Observations/Individualized Feedback:     Plan: Continue to engage patient in RT group sessions 2-3x/week.   Ezell Melikian-McCall, LRT,CTRS 08/13/2023 3:18 PM

## 2023-08-13 NOTE — Plan of Care (Signed)
  Problem: Education: Goal: Knowledge of [facility] Education information/materials will improve Outcome: Progressing Goal: Emotional status will improve Outcome: Progressing Goal: Mental status will improve Outcome: Progressing Goal: Verbalization of understanding the information provided will improve Outcome: Progressing   

## 2023-08-13 NOTE — Group Note (Signed)
Date:  08/13/2023 Time:  12:11 PM  Group Topic/Focus:  Goals Group:   The focus of this group is to help patients establish daily goals to achieve during treatment and discuss how the patient can incorporate goal setting into their daily lives to aide in recovery.    Participation Level:  Active  Participation Quality:  Appropriate  Affect:  Appropriate  Cognitive:  Alert  Insight: Appropriate  Engagement in Group:  Engaged  Modes of Intervention:  Confrontation  Additional Comments:    Beckie Busing 08/13/2023, 12:11 PM

## 2023-08-13 NOTE — Plan of Care (Signed)
  Problem: Education: Goal: Mental status will improve Outcome: Progressing   Problem: Education: Goal: Verbalization of understanding the information provided will improve Outcome: Progressing   Problem: Activity: Goal: Interest or engagement in activities will improve Outcome: Progressing Goal: Sleeping patterns will improve Outcome: Progressing   Problem: Coping: Goal: Ability to verbalize frustrations and anger appropriately will improve Outcome: Progressing Goal: Ability to demonstrate self-control will improve Outcome: Progressing   Problem: Safety: Goal: Periods of time without injury will increase Outcome: Progressing

## 2023-08-14 DIAGNOSIS — F333 Major depressive disorder, recurrent, severe with psychotic symptoms: Secondary | ICD-10-CM | POA: Diagnosis not present

## 2023-08-14 MED ORDER — HYDROXYZINE HCL 25 MG PO TABS
25.0000 mg | ORAL_TABLET | Freq: Three times a day (TID) | ORAL | Status: DC | PRN
  Administered 2023-08-18: 25 mg via ORAL
  Filled 2023-08-14: qty 1

## 2023-08-14 MED ORDER — WHITE PETROLATUM EX OINT
TOPICAL_OINTMENT | CUTANEOUS | Status: AC
  Filled 2023-08-14: qty 5

## 2023-08-14 MED ORDER — SERTRALINE HCL 25 MG PO TABS
75.0000 mg | ORAL_TABLET | Freq: Every day | ORAL | Status: DC
  Administered 2023-08-15: 75 mg via ORAL
  Filled 2023-08-14 (×3): qty 3

## 2023-08-14 MED ORDER — MELATONIN 3 MG PO TABS
3.0000 mg | ORAL_TABLET | Freq: Every day | ORAL | Status: DC
  Administered 2023-08-14 – 2023-08-18 (×5): 3 mg via ORAL
  Filled 2023-08-14 (×7): qty 1

## 2023-08-14 NOTE — Progress Notes (Signed)
[facility] MD Progress Note  08/14/2023 3:27 PM Marie Atkins  MRN:  [id number]  Principal Problem: Severe recurrent major depressive disorder with psychotic features (HCC) Diagnosis: Principal Problem:   Severe recurrent major depressive disorder with psychotic features (HCC)  Reason for Admission:  Marie Atkins is a 51 y.o. female with a past psychiatric history of amphetamine use disorder, Tobacco dependence, Depression and.  Disorder who presented to the [facility] ED with suicidal ideation with plan to OD on Cocaine vs slit her wrist. She was admitted to the [facility] on 9/29.       24 hr Chart Review: Elevated BP and HR. Patient is compliant with routine medication regimen without difficulty. Sleep hours last night: 6.75 hours, as documented in the nursing flow sheets. No behavioral episodes or nursing concerns were reported at this time. PRN medication was administered according to the nursing record: Milk of Magnesia for upset stomach.      During today's rounds, the patient was observed lying in bed. On today's assessment, she described her mood as "down." She rated her anxiety as 5 and depression as 8 out of 10, with 10 being the most severe. She reports feeling frustrated with recurrent relapses. Patient reports she is actively seeking residential treatment for substance abuse, and has been calling around to local facilities daily.  She reported difficulty falling asleep last night. She reports previously taking trazodone, which made her feel worse the morning after. Discussed the benefits of Melatonin and patient is agreeable to this medication to enhance sleep support at bedtime. Her appetite is reported as decreased, and she reports not eating lunch today due to "stomach turning." She states this may be due to her  nerves/anxiety.                                                                                                                                                                                                                                                                                         Her energy level and concentration were described as "not good."  She denied having any suicidal thoughts, plans, or intent, as well as any homicidal ideation. She denies  auditory or visual hallucinations, thought insertion, or paranoia.  The patient reported attending and actively participating in group sessions. Communication during the encounter was clear, with no signs distractibility or preoccupation. Patient reported that her current medication regimen is ineffectively managing her depressive symptoms; However she denies any side effects.     No signs of TD (Tardive Dyskinesia) or EPS (Extrapyramidal Symptoms) were observed during the assessment, and the patient reports no feelings of stiffness. AIMS score: 0.   Continued hospitalization remains necessary at this time, to treat and stabilize mental status prior to discharge.   Tentative discharge date set for Monday, October 7, as discussed in interdisciplinary team pending safety planning and outpatient follow-up appointments being completed by CSW.    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, medical use disorder, alcohol use disorder, cocaine use disorder Prior inpatient treatment: [facility] April 2024, and [facility] May 2024 Current/prior outpatient treatment: Fluoxetine 20 mg daily Prior rehab hx: [facility] in May 2024 Psychotherapy hx: Previously  History  of suicide: Prior attempt in April 2024  History of homicide or aggression: Denies Psychiatric medication history: fluoxetine, risperidone  Psychiatric medication compliance history: Neuromodulation history:  Current Psychiatrist: Current therapist:     Past Medical History:  Past Medical History:  Diagnosis Date   Cocaine abuse (HCC)    Depression    HTN (hypertension)    Iron deficiency anemia     Past Surgical History:  Procedure Laterality Date   TUBAL LIGATION     Family History:  Family History  Problem Relation Age of Onset   Hypertension Mother    Diabetes Mother    CVA Mother    Heart attack Mother    Colon cancer Maternal Uncle    Family Psychiatric  History: See H&P  Social History:  Social History   Substance and Sexual Activity  Alcohol Use Yes   Alcohol/week: 42.0 standard drinks of alcohol   Types: 42 Cans of beer per week     Social History   Substance and Sexual Activity  Drug Use Yes   Types: Cocaine, "Crack" cocaine    Social History   Socioeconomic History   Marital status: Single    Spouse name: Not on file   Number of children: Not on file   Years of education: Not on file   Highest education level: Not on file  Occupational History   Not on file  Tobacco Use   Smoking status: Every Day    Current packs/day: 0.25    Average packs/day: 0.3 packs/day for 36.0 years (9.0 ttl pk-yrs)    Types: Cigarettes   Smokeless tobacco: Never   Tobacco comments:    10 cigarettes/day  Vaping Use   Vaping status: Never Used  Substance and Sexual Activity   Alcohol use: Yes    Alcohol/week: 42.0 standard drinks of alcohol    Types: 42 Cans of beer per week   Drug use: Yes    Types: Cocaine, "Crack" cocaine   Sexual activity: Yes  Other Topics Concern  Not on file  Social History Narrative   Not on file   Social Determinants of Health   Financial Resource Strain: Not on File (02/28/2022)   Received from [employer], [place]   Financial  Resource Strain    Financial Resource Strain: 0  Food Insecurity: Food Insecurity Present (08/10/2023)   Hunger Vital Sign    Worried About Running Out of Food in the Last Year: Sometimes true    Ran Out of Food in the Last Year: Sometimes true  Transportation Needs: Unmet Transportation Needs (08/10/2023)   PRAPARE - Administrator, Civil Service (Medical): Yes    Lack of Transportation (Non-Medical): Yes  Physical Activity: Not on File (02/28/2022)   Received from [place], [place]   Physical Activity    Physical Activity: 0  Stress: Not on File (02/28/2022)   Received from [facility], [place]   Stress    Stress: 0  Social Connections: Not on File (07/26/2023)   Received from [employer]   Social Connections    Connectedness: 0   Additional Social History:                           Current Medications: Current Facility-Administered Medications  Medication Dose Route Frequency Provider Last Rate Last Admin   acetaminophen (TYLENOL) tablet 650 mg  650 mg Oral Q6H PRN [name] C, NP   650 mg at 08/11/23 0803   alum & mag hydroxide-simeth (MAALOX/MYLANTA) 200-200-20 MG/5ML suspension 30 mL  30 mL Oral Q4H PRN [name] C, NP       diphenhydrAMINE (BENADRYL) capsule 50 mg  50 mg Oral TID PRN [name] C, NP       Or   diphenhydrAMINE (BENADRYL) injection 50 mg  50 mg Intramuscular TID PRN [name] C, NP       gabapentin (NEURONTIN) capsule 300 mg  300 mg Oral TID [name] C, NP   300 mg at 08/14/23 1151   haloperidol (HALDOL) tablet 5 mg  5 mg Oral TID PRN [name] C, NP       Or   haloperidol lactate (HALDOL) injection 5 mg  5 mg Intramuscular TID PRN Earney Navy, NP       hydrOXYzine (ATARAX) tablet 25 mg  25 mg Oral TID PRN Phineas Inches, MD       ibuprofen (ADVIL) tablet 400 mg  400 mg Oral Q6H Peterson Ao, MD   400 mg at 08/14/23 1405   influenza vac split trivalent PF (FLULAVAL) injection 0.5 mL  0.5 mL  Intramuscular Tomorrow-1000 Massengill, Harrold Donath, MD       levothyroxine (SYNTHROID) tablet 50 mcg  50 mcg Oral Q0600 [name] C, NP   50 mcg at 08/14/23 7062   LORazepam (ATIVAN) tablet 2 mg  2 mg Oral TID PRN Earney Navy, NP       Or   LORazepam (ATIVAN) injection 2 mg  2 mg Intramuscular TID PRN [name] C, NP       losartan (COZAAR) tablet 25 mg  25 mg Oral Daily Nkwenti, Doris, NP   25 mg at 08/14/23 0753   magnesium hydroxide (MILK OF MAGNESIA) suspension 15 mL  15 mL Oral Daily PRN [name] C, NP   15 mL at 08/13/23 0829   melatonin tablet 3 mg  3 mg Oral QHS Massengill, Nathan, MD       nicotine (NICODERM CQ - dosed in mg/24 hours) patch  14 mg  14 mg Transdermal Daily Peterson Ao, MD       nicotine polacrilex (NICORETTE) gum 2 mg  2 mg Oral PRN Massengill, Harrold Donath, MD       pantoprazole (PROTONIX) EC tablet 40 mg  40 mg Oral Daily [name] C, NP   40 mg at 08/14/23 0753   pneumococcal 20-valent conjugate vaccine (PREVNAR 20) injection 0.5 mL  0.5 mL Intramuscular Tomorrow-1000 Massengill, Harrold Donath, MD       risperiDONE (RISPERDAL) tablet 2 mg  2 mg Oral QHS Aerianna Losey H, NP   2 mg at 08/13/23 2234   [START ON 08/15/2023] sertraline (ZOLOFT) tablet 75 mg  75 mg Oral Daily Massengill, Nathan, MD       traZODone (DESYREL) tablet 50 mg  50 mg Oral QHS PRN Peterson Ao, MD        Lab Results:  No results found for this or any previous visit (from the past 48 hour(s)).   Blood Alcohol level:  Lab Results  Component Value Date   ETH <10 08/09/2023   ETH <10 06/23/2020    Metabolic Disorder Labs: Lab Results  Component Value Date   HGBA1C 5.3 08/12/2023   MPG 105 08/12/2023   MPG 91 03/05/2023   No results found for: "PROLACTIN" Lab Results  Component Value Date   CHOL 180 08/12/2023   TRIG 78 08/12/2023   HDL 66 08/12/2023   CHOLHDL 2.7 08/12/2023   VLDL 16 08/12/2023   LDLCALC 98 08/12/2023   LDLCALC 98 03/05/2023     Physical Findings: AIMS: Facial and Oral Movements Muscles of Facial Expression: None, normal Lips and Perioral Area: None, normal Jaw: None, normal Tongue: None, normal,Extremity Movements Upper (arms, wrists, hands, fingers): None, normal Lower (legs, knees, ankles, toes): None, normal, Trunk Movements Neck, shoulders, hips: None, normal, Overall Severity Severity of abnormal movements (highest score from questions above): None, normal Incapacitation due to abnormal movements: None, normal Patient's awareness of abnormal movements (rate only patient's report): No Awareness, Dental Status Current problems with teeth and/or dentures?: No Does patient usually wear dentures?: No  CIWA:    COWS:     Musculoskeletal: Strength & Muscle Tone: within normal limits Gait & Station: normal Patient leans: N/A  Psychiatric Specialty Exam:  Presentation  General Appearance:  Disheveled  Eye Contact: Fair  Speech: Normal Rate  Speech Volume: Decreased  Handedness: Right   Mood and Affect  Mood: Anxious; Depressed  Affect: Depressed; Flat; Tearful   Thought Process  Thought Processes: Linear  Descriptions of Associations:Intact  Orientation:Full (Time, Place and Person)  Thought Content:Logical  History of Schizophrenia/Schizoaffective disorder:No  Duration of Psychotic Symptoms:N/A  Hallucinations:Hallucinations: Auditory; Visual  Ideas of Reference:None  Suicidal Thoughts:Suicidal Thoughts: No  Homicidal Thoughts:Homicidal Thoughts: No   Sensorium  Memory: Immediate Good; Remote Good; Recent Good  Judgment: Fair  Insight: Fair   Chartered certified accountant: Fair  Attention Span: Fair  Recall: Good  Fund of Knowledge: Good  Language: Good   Psychomotor Activity  Psychomotor Activity: Psychomotor Activity: Normal   Assets  Assets: Desire for Improvement; Resilience   Sleep  Sleep: Sleep: Fair    Physical  Exam: Physical Exam Vitals and nursing note reviewed.  Constitutional:      General: She is not in acute distress.    Appearance: She is normal weight.  HENT:     Head: Normocephalic.     Nose: Nose normal.  Pulmonary:     Effort: Pulmonary effort is normal.  No respiratory distress.  Musculoskeletal:        General: Normal range of motion.     Cervical back: Normal range of motion.  Neurological:     Mental Status: She is alert and oriented to person, place, and time.     Motor: No weakness.     Gait: Gait normal.    Review of Systems  Constitutional:  Negative for fever.  Respiratory:  Negative for wheezing.   Cardiovascular:  Negative for chest pain and palpitations.  Neurological:  Negative for tremors.  Psychiatric/Behavioral:  Positive for depression and substance abuse. The patient is nervous/anxious and has insomnia.   All other systems reviewed and are negative.  Blood pressure (!) 151/91, pulse (!) 106, temperature 98.9 F (37.2 C), temperature source Oral, resp. rate 18, height 5\' 4"  (1.626 m), weight 82.4 kg, SpO2 100%. Body mass index is 31.17 kg/m.  Diagnoses / Active Problems:   Major Depressive Disorder, Recurrent, Severe, w psychotic features  GAD Stimulant  Use Disorder  Alcohol Use Disorder  Tobacco Use Disorder    PLAN: Safety and Monitoring:             --  Voluntary admission to inpatient psychiatric unit for safety, stabilization and treatment             -- Daily contact with patient to assess and evaluate symptoms and progress in treatment             -- Patient's case to be discussed in multi-disciplinary team meeting             -- Observation Level : q15 minute checks             -- Vital signs:  q12 hours             -- Precautions: suicide, elopement, and assault   2. Psychiatric Diagnoses and Treatment:  --Continue Sertraline from 50 to 75 mg every day for MDD --Start Melatonin 3 mg daily at bedtime --Continue Risperdal 2 mg at  bedtime for psychotic symptoms of mdd  --Start Hydroxyzine 25 mg, three times daily, as needed for anxiety --Consult Chaplain for emotional and spiritual support  (Previously discontinued prozac due to ineffectiveness)      -- The risks/benefits/side-effects/alternatives to this medication were discussed in detail with the patient and time was given for questions. The patient consents to medication trial.              -- Metabolic profile and EKG monitoring obtained while on an atypical antipsychotic (BMI: Lipid Panel: HbgA1c: QTc:) pending              -- Encouraged patient to participate in unit milieu and in scheduled group therapies             3. Medical Issues Being Addressed:              Neuropathic pain -- Gabapentin 300 mg TID  GERD -- Protonix 40 mg daily  Hypothyroidism:  -- synthroid 50 mcg   Tobacco Use Disorder             -- Nicotine patch 14mg /24 hours ordered             -- Smoking cessation encouraged   4. Discharge Planning:              -- Social work and case management to assist with discharge planning and identification of hospital follow-up needs prior to discharge             --  Estimated LOS: 4-5 more days             -- Discharge Concerns: Need to establish a safety plan; Medication compliance and effectiveness             -- Discharge Goals: Return home with outpatient referrals for mental health follow-up including medication management/psychotherapy      Norma Fredrickson, NP 08/14/2023, 3:27 PM  Total Time Spent in Direct Patient Care:  I personally spent 35 minutes on the unit in direct patient care. The direct patient care time included face-to-face time with the patient, reviewing the patient's chart, communicating with other professionals, and coordinating care. Greater than 50% of this time was spent in counseling or coordinating care with the patient regarding goals of hospitalization, psycho-education, and discharge planning  needs. Patient ID: Marie Atkins, female   DOB: [birth year], 52 y.o.   MRN: [id number]

## 2023-08-14 NOTE — [facility] Group Notes (Signed)
[facility] Group Notes:  (Nursing/MHT/Case Management/Adjunct)  Date:  08/14/2023  Time:  9:21 PM  Type of Therapy:  The focus of this group is to help patients establish daily goals to achieve during treatment and discuss how the patient can incorporate goal setting into their daily lives to aide in recovery.   Participation Level:  Active  Participation Quality:  Appropriate, Sharing, and Supportive  Affect:  Anxious and Appropriate  Cognitive:  Alert and Appropriate  Insight:  Appropriate, Good, and Improving  Engagement in Group:  Engaged and Supportive  Modes of Intervention:  Socialization and Support  Summary of Progress/Problems: Pt attended group  Marie Atkins 08/14/2023, 9:21 PM

## 2023-08-14 NOTE — Group Note (Signed)
LCSW Group Therapy Note   Group Date: 08/14/2023 Start Time: 1100 End Time: 1200   Type of Therapy and Topic:  Group Therapy: Boundaries  Participation Level:  None  Description of Group: This group will address the use of boundaries in their personal lives. Patients will explore why boundaries are important, the difference between healthy and unhealthy boundaries, and negative and postive outcomes of different boundaries and will look at how boundaries can be crossed.  Patients will be encouraged to identify current boundaries in their own lives and identify what kind of boundary is being set. Facilitators will guide patients in utilizing problem-solving interventions to address and correct types boundaries being used and to address when no boundary is being used. Understanding and applying boundaries will be explored and addressed for obtaining and maintaining a balanced life. Patients will be encouraged to explore ways to assertively make their boundaries and needs known to significant others in their lives, using other group members and facilitator for role play, support, and feedback.  Therapeutic Goals:  1.  Patient will identify areas in their life where setting clear boundaries could be  used to improve their life.  2.  Patient will identify signs/triggers that a boundary is not being respected. 3.  Patient will identify two ways to set boundaries in order to achieve balance in  their lives: 4.  Patient will demonstrate ability to communicate their needs and set boundaries  through discussion and/or role plays  Summary of Patient Progress:  Patient was no interactive present/active throughout the session and proved open to feedback from CSW and peers. Patient demonstrated no insight into the subject matter, was respectful of peers, and was present throughout the entire session.  Therapeutic Modalities:   Cognitive Behavioral Therapy Solution-Focused Therapy  [name],  [place] 08/14/2023  3:00 PM

## 2023-08-14 NOTE — Group Note (Signed)
Date:  08/14/2023 Time:  9:13 AM  Group Topic/Focus:  Goals Group:   The focus of this group is to help patients establish daily goals to achieve during treatment and discuss how the patient can incorporate goal setting into their daily lives to aide in recovery.    Participation Level:  Did Not Attend  Participation Quality:   NA  Affect:   NA  Cognitive:   NA  Insight: None  Engagement in Group:   NA  Modes of Intervention:   NA  Additional Comments:    Beckie Busing 08/14/2023, 9:13 AM

## 2023-08-14 NOTE — Progress Notes (Signed)
   08/14/23 2000  Psych Admission Type (Psych Patients Only)  Admission Status Voluntary  Psychosocial Assessment  Patient Complaints Anxiety  Eye Contact Fair  Facial Expression Animated  Affect Appropriate to circumstance  Speech Slow  Interaction Assertive  Motor Activity Slow  Appearance/Hygiene Unremarkable  Behavior Characteristics Cooperative;Appropriate to situation  Mood Pleasant  Thought Process  Coherency WDL  Content WDL  Delusions None reported or observed  Perception WDL  Hallucination None reported or observed  Judgment Poor  Confusion None  Danger to Self  Current suicidal ideation? Denies  Self-Injurious Behavior No self-injurious ideation or behavior indicators observed or expressed   Agreement Not to Harm Self Yes  Description of Agreement verbal  Danger to Others  Danger to Others None reported or observed

## 2023-08-14 NOTE — Progress Notes (Signed)
CSW met with pt to discuss Tx planning and placement. Pt is awaiting acceptance from [facility] Pt will inquire on 08-15-23. Will continue to monitor.

## 2023-08-14 NOTE — Progress Notes (Signed)
Spoke with pt about ways that are beneficial  to her to releave anxiety and depression and triggers to avoid

## 2023-08-14 NOTE — Progress Notes (Signed)
Landon, is somewhat flat and states, "I am still having depression and anxiety" Pt denies SI/HI/AVH Her B/P was 151/91 HR 106 She denies chest pain or SOB She is calm and cooperative.She voices n complaints currently

## 2023-08-14 NOTE — Plan of Care (Signed)
  Problem: Education: Goal: Emotional status will improve Outcome: Progressing Goal: Mental status will improve Outcome: Progressing   Problem: Activity: Goal: Interest or engagement in activities will improve Outcome: Progressing Goal: Sleeping patterns will improve Outcome: Progressing

## 2023-08-15 ENCOUNTER — Encounter (HOSPITAL_COMMUNITY): Payer: Self-pay

## 2023-08-15 DIAGNOSIS — F333 Major depressive disorder, recurrent, severe with psychotic symptoms: Secondary | ICD-10-CM | POA: Diagnosis not present

## 2023-08-15 MED ORDER — SERTRALINE HCL 100 MG PO TABS
100.0000 mg | ORAL_TABLET | Freq: Every day | ORAL | Status: DC
  Administered 2023-08-16 – 2023-08-19 (×4): 100 mg via ORAL
  Filled 2023-08-15 (×5): qty 1

## 2023-08-15 NOTE — BH IP Treatment Plan (Signed)
Interdisciplinary Treatment and Diagnostic Plan Update  08/15/2023 Time of Session: 9:20 AM ( update)  Lecretia Buczek MRN: [id number]  Principal Diagnosis: Severe recurrent major depressive disorder with psychotic features (HCC)  Secondary Diagnoses: Principal Problem:   Severe recurrent major depressive disorder with psychotic features (HCC)   Current Medications:  Current Facility-Administered Medications  Medication Dose Route Frequency Provider Last Rate Last Admin   acetaminophen (TYLENOL) tablet 650 mg  650 mg Oral Q6H PRN [name] C, NP   650 mg at 08/11/23 0803   alum & mag hydroxide-simeth (MAALOX/MYLANTA) 200-200-20 MG/5ML suspension 30 mL  30 mL Oral Q4H PRN [name] C, NP       diphenhydrAMINE (BENADRYL) capsule 50 mg  50 mg Oral TID PRN Earney Navy, NP       Or   diphenhydrAMINE (BENADRYL) injection 50 mg  50 mg Intramuscular TID PRN [name] C, NP       gabapentin (NEURONTIN) capsule 300 mg  300 mg Oral TID [name] C, NP   300 mg at 08/15/23 0827   haloperidol (HALDOL) tablet 5 mg  5 mg Oral TID PRN Earney Navy, NP       Or   haloperidol lactate (HALDOL) injection 5 mg  5 mg Intramuscular TID PRN Earney Navy, NP       hydrOXYzine (ATARAX) tablet 25 mg  25 mg Oral TID PRN Phineas Inches, MD       ibuprofen (ADVIL) tablet 400 mg  400 mg Oral Q6H Peterson Ao, MD   400 mg at 08/14/23 2012   influenza vac split trivalent PF (FLULAVAL) injection 0.5 mL  0.5 mL Intramuscular Tomorrow-1000 Massengill, Harrold Donath, MD       levothyroxine (SYNTHROID) tablet 50 mcg  50 mcg Oral Q0600 [name] C, NP   50 mcg at 08/15/23 2952   LORazepam (ATIVAN) tablet 2 mg  2 mg Oral TID PRN Earney Navy, NP       Or   LORazepam (ATIVAN) injection 2 mg  2 mg Intramuscular TID PRN Earney Navy, NP       losartan (COZAAR) tablet 25 mg  25 mg Oral Daily Starleen Blue, NP   25 mg at 08/15/23 8413   magnesium hydroxide  (MILK OF MAGNESIA) suspension 15 mL  15 mL Oral Daily PRN [name] C, NP   15 mL at 08/13/23 0829   melatonin tablet 3 mg  3 mg Oral QHS Massengill, Harrold Donath, MD   3 mg at 08/14/23 2123   nicotine (NICODERM CQ - dosed in mg/24 hours) patch 14 mg  14 mg Transdermal Daily Peterson Ao, MD       nicotine polacrilex (NICORETTE) gum 2 mg  2 mg Oral PRN Massengill, Harrold Donath, MD       pantoprazole (PROTONIX) EC tablet 40 mg  40 mg Oral Daily Onuoha, Josephine C, NP   40 mg at 08/15/23 2440   pneumococcal 20-valent conjugate vaccine (PREVNAR 20) injection 0.5 mL  0.5 mL Intramuscular Tomorrow-1000 Massengill, Harrold Donath, MD       risperiDONE (RISPERDAL) tablet 2 mg  2 mg Oral QHS Bennett, Christal H, NP   2 mg at 08/14/23 2012   [START ON 08/16/2023] sertraline (ZOLOFT) tablet 100 mg  100 mg Oral Daily Massengill, Nathan, MD       traZODone (DESYREL) tablet 50 mg  50 mg Oral QHS PRN Peterson Ao, MD       PTA Medications: Medications Prior to Admission  Medication Sig Dispense Refill Last Dose   Bismuth 262 MG CHEW Chew 524 mg by mouth in the morning, at noon, in the evening, and at bedtime. (Patient not taking: Reported on 08/09/2023) 112 tablet 0    ARIPiprazole (ABILIFY) 5 MG tablet Take 5 mg by mouth daily. (Patient not taking: Reported on 08/09/2023)      cloNIDine (CATAPRES) 0.1 MG tablet Take 0.1 mg by mouth 3 (three) times daily. (Patient not taking: Reported on 08/09/2023)      FLUoxetine (PROZAC) 20 MG capsule Take 40 mg by mouth in the morning. (Patient not taking: Reported on 08/09/2023)      gabapentin (NEURONTIN) 300 MG capsule Take 1 capsule (300 mg total) by mouth 3 (three) times daily. (Patient not taking: Reported on 08/09/2023) 90 capsule 0    ketorolac (TORADOL) 10 MG tablet Take 1 tablet (10 mg total) by mouth every 6 (six) hours as needed (pain). (Patient not taking: Reported on 08/09/2023) 20 tablet 0    levothyroxine (SYNTHROID) 50 MCG tablet Take 1 tablet (50 mcg total) by mouth  daily at 6 (six) AM. (Patient not taking: Reported on 08/09/2023) 30 tablet 0    losartan (COZAAR) 100 MG tablet Take 1 tablet (100 mg total) by mouth daily. (Patient not taking: Reported on 08/09/2023) 30 tablet 0    melatonin 3 MG TABS tablet Take 1 tablet (3 mg total) by mouth at bedtime. (Patient not taking: Reported on 08/09/2023) 30 tablet 0    mirtazapine (REMERON) 15 MG tablet Take 15 mg by mouth at bedtime. (Patient not taking: Reported on 08/09/2023)      nicotine (NICODERM CQ - DOSED IN MG/24 HOURS) 14 mg/24hr patch Place 1 patch (14 mg total) onto the skin daily. (Patient not taking: Reported on 03/12/2023) 28 patch 0    omeprazole (PRILOSEC) 20 MG capsule Take 20 mg by mouth daily. (Patient not taking: Reported on 08/09/2023)      risperiDONE (RISPERDAL) 1 MG tablet Take 1 tablet (1 mg total) by mouth 2 (two) times daily. (Patient not taking: Reported on 08/09/2023) 60 tablet 0    tiZANidine (ZANAFLEX) 4 MG tablet Take 1 tablet (4 mg total) by mouth every 8 (eight) hours as needed for muscle spasms. (Patient not taking: Reported on 08/09/2023) 15 tablet 0    Vitamin D, Ergocalciferol, (DRISDOL) 1.25 MG (50000 UNIT) CAPS capsule Take 1 capsule (50,000 Units total) by mouth every 7 (seven) days. (Patient not taking: Reported on 08/09/2023) 5 capsule 0     Patient Stressors: Marital or family conflict   Substance abuse    Patient Strengths: Capable of independent living  Motivation for treatment/growth   Treatment Modalities: Medication Management, Group therapy, Case management,  1 to 1 session with clinician, Psychoeducation, Recreational therapy.   Physician Treatment Plan for Primary Diagnosis: Severe recurrent major depressive disorder with psychotic features (HCC) Long Term Goal(s): Improvement in symptoms so as ready for discharge   Short Term Goals: Ability to identify changes in lifestyle to reduce recurrence of condition will improve Ability to verbalize feelings will  improve Ability to disclose and discuss suicidal ideas Ability to demonstrate self-control will improve Ability to identify and develop effective coping behaviors will improve Ability to maintain clinical measurements within normal limits will improve Compliance with prescribed medications will improve Ability to identify triggers associated with substance abuse/mental health issues will improve  Medication Management: Evaluate patient's response, side effects, and tolerance of medication regimen.  Therapeutic Interventions: 1 to 1 sessions, Unit  Group sessions and Medication administration.  Evaluation of Outcomes: Progressing  Physician Treatment Plan for Secondary Diagnosis: Principal Problem:   Severe recurrent major depressive disorder with psychotic features (HCC)  Long Term Goal(s): Improvement in symptoms so as ready for discharge   Short Term Goals: Ability to identify changes in lifestyle to reduce recurrence of condition will improve Ability to verbalize feelings will improve Ability to disclose and discuss suicidal ideas Ability to demonstrate self-control will improve Ability to identify and develop effective coping behaviors will improve Ability to maintain clinical measurements within normal limits will improve Compliance with prescribed medications will improve Ability to identify triggers associated with substance abuse/mental health issues will improve     Medication Management: Evaluate patient's response, side effects, and tolerance of medication regimen.  Therapeutic Interventions: 1 to 1 sessions, Unit Group sessions and Medication administration.  Evaluation of Outcomes: Progressing   RN Treatment Plan for Primary Diagnosis: Severe recurrent major depressive disorder with psychotic features (HCC) Long Term Goal(s): Knowledge of disease and therapeutic regimen to maintain health will improve  Short Term Goals: Ability to remain free from injury will  improve, Ability to verbalize frustration and anger appropriately will improve, Ability to participate in decision making will improve, Ability to verbalize feelings will improve, Ability to identify and develop effective coping behaviors will improve, and Compliance with prescribed medications will improve  Medication Management: RN will administer medications as ordered by provider, will assess and evaluate patient's response and provide education to patient for prescribed medication. RN will report any adverse and/or side effects to prescribing provider.  Therapeutic Interventions: 1 on 1 counseling sessions, Psychoeducation, Medication administration, Evaluate responses to treatment, Monitor vital signs and CBGs as ordered, Perform/monitor CIWA, COWS, AIMS and Fall Risk screenings as ordered, Perform wound care treatments as ordered.  Evaluation of Outcomes: Progressing   LCSW Treatment Plan for Primary Diagnosis: Severe recurrent major depressive disorder with psychotic features (HCC) Long Term Goal(s): Safe transition to appropriate next level of care at discharge, Engage patient in therapeutic group addressing interpersonal concerns.  Short Term Goals: Engage patient in aftercare planning with referrals and resources, Increase social support, Increase emotional regulation, Facilitate acceptance of mental health diagnosis and concerns, Identify triggers associated with mental health/substance abuse issues, and Increase skills for wellness and recovery  Therapeutic Interventions: Assess for all discharge needs, 1 to 1 time with Social worker, Explore available resources and support systems, Assess for adequacy in community support network, Educate family and significant other(s) on suicide prevention, Complete Psychosocial Assessment, Interpersonal group therapy.  Evaluation of Outcomes: Progressing  Progress in Treatment: Attending groups: Yes. Participating in groups: Yes. Taking  medication as prescribed: Yes. Toleration medication: Yes. Family/Significant other contact made: No, will contact:  Pt declined  Patient understands diagnosis: Yes. Discussing patient identified problems/goals with staff: Yes. Medical problems stabilized or resolved: Yes. Denies suicidal/homicidal ideation: Yes. Issues/concerns per patient self-inventory: No.   New problem(s) identified: No, Describe:  none reported   New Short Term/Long Term Goal(s):detox, medication management for mood stabilization; elimination of SI thoughts; development of comprehensive mental wellness/sobriety plan   Patient Goals:  "go into a rehab or treatment center, find a job and find my own place."   Discharge Plan or Barriers: Patient recently admitted. CSW will continue to follow and assess for appropriate referrals and possible discharge planning.      Reason for Continuation of Hospitalization: Depression Medication stabilization Suicidal ideation   Estimated Length of Stay:2-3 days   Last 3 [place]  Suicide Severity Risk Score: Flowsheet Row Ad[facility] (Current) [facility]09[facility]2023 [facility] ED from 06/13/2023 in [facility] RISK CATEGORY High Risk High Risk No Risk       Last [facility] 2/9 Scores:    06/16/2020    4:08 PM 06/15/2020    9:56 AM  Depression screen PHQ 2/9  Decreased Interest 2 1  Down, Depressed, Hopeless 2 1  PHQ - 2 Score 4 2  Altered sleeping 2 1  Tired, decreased energy 2 1  Change in appetite 2 1  Feeling bad or failure about yourself  2 2  Trouble concentrating 2 1  Moving slowly or fidgety/restless 2 1  Suicidal thoughts 1 0  PHQ-9 Score 17 9  Difficult doing work/chores Somewhat difficult Somewhat difficult    Scribe for Treatment Team: Beather Arbour 08/15/2023 12:51 PM

## 2023-08-15 NOTE — Group Note (Signed)
Recreation Therapy Group Note   Group Topic:Problem Solving  Group Date: 08/15/2023 Start Time: 0930 End Time: 0950 Facilitators: Evaristo Tsuda-McCall, LRT,CTRS Location: [address] Dayroom   Group Topic: Communication, Team Building, Problem Solving  Goal Area(s) Addresses:  Patient will effectively work with peer towards shared goal.  Patient will identify skills used to make activity successful.  Patient will identify how skills used during activity can be applied to reach post d/c goals.   Intervention: STEM Activity- Glass blower/designer  Group Description: Tallest Pharmacist, community. In teams of 5-6, patients were given 11 craft pipe cleaners. Using the materials provided, patients were instructed to compete again the opposing team(s) to build the tallest free-standing structure from floor level. The activity was timed; difficulty increased by Clinical research associate as Production designer, theatre/television/film continued.  Systematically resources were removed with additional directions for example, placing one arm behind their back, working in silence, and shape stipulations. LRT facilitated post-activity discussion reviewing team processes and necessary communication skills involved in completion. Patients were encouraged to reflect how the skills utilized, or not utilized, in this activity can be incorporated to positively impact support systems post discharge.  Education: Pharmacist, community, Scientist, physiological, Discharge Planning   Education Outcome: Acknowledges education/In group clarification offered/Needs additional education.    Affect/Mood: Appropriate   Participation Level: Engaged   Participation Quality: Independent   Behavior: Appropriate   Speech/Thought Process: Focused   Insight: Good   Judgement: Good   Modes of Intervention: STEM Activity   Patient Response to Interventions:  Engaged   Education Outcome:  In group clarification offered    Clinical Observations/Individualized Feedback: Pt  attended and participated in group session.     Plan: Continue to engage patient in RT group sessions 2-3x/week.   Leone Putman-McCall, LRT,CTRS 08/15/2023 11:53 AM

## 2023-08-15 NOTE — Group Note (Deleted)
Date:  08/15/2023 Time:  10:41 AM  Group Topic/Focus:  Goals Group:   The focus of this group is to help patients establish daily goals to achieve during treatment and discuss how the patient can incorporate goal setting into their daily lives to aide in recovery.     Participation Level:  {BHH PARTICIPATION ZOXWR:60454}  Participation Quality:  {BHH PARTICIPATION QUALITY:22265}  Affect:  {BHH AFFECT:22266}  Cognitive:  {BHH COGNITIVE:22267}  Insight: {BHH Insight2:20797}  Engagement in Group:  {BHH ENGAGEMENT IN UJWJX:91478}  Modes of Intervention:  {BHH MODES OF INTERVENTION:22269}  Additional Comments:  ***  Beckie Busing 08/15/2023, 10:41 AM

## 2023-08-15 NOTE — Group Note (Signed)
Date:  08/15/2023 Time:  10:24 AM  Group Topic/Focus:  Goals Group:   The focus of this group is to help patients establish daily goals to achieve during treatment and discuss how the patient can incorporate goal setting into their daily lives to aide in recovery.    Participation Level:  Active  Participation Quality:  Appropriate  Affect:  Appropriate  Cognitive:  Alert  Insight: Appropriate  Engagement in Group:  Engaged  Modes of Intervention:  Discussion  Additional Comments:  NA  Beckie Busing 08/15/2023, 10:24 AM

## 2023-08-15 NOTE — Progress Notes (Signed)
Marie Atkins, asleep but arouses easily in response to voice, Alert, calm and cooperative but states still anxious and depressed. Denies SI/HI/AVH.Pt voices no complaints currently.

## 2023-08-15 NOTE — Progress Notes (Signed)
[facility], made a phone call attempting to reserve a bed in a 90 day facility, Found options in [place]and [place]. She said, "It's time for a new life and I am kind of excited about it"

## 2023-08-15 NOTE — Plan of Care (Signed)
  Problem: Education: Goal: Knowledge of [facility] Education information/materials will improve Outcome: Progressing   Problem: Activity: Goal: Interest or engagement in activities will improve Outcome: Progressing   Problem: Coping: Goal: Ability to verbalize frustrations and anger appropriately will improve Outcome: Progressing   

## 2023-08-15 NOTE — BHH Group Notes (Signed)
BHH Group Notes:  (Nursing/MHT/Case Management/Adjunct)  Date:  08/15/2023  Time:  8:22 PM  Type of Therapy:   Wrap Up Group  Participation Level:  Active  Participation Quality:  Appropriate  Affect:  Appropriate  Cognitive:  Appropriate  Insight:  Improving  Engagement in Group:  Developing/Improving  Modes of Intervention:  Discussion and Support  Summary of Progress/Problems: Patient participated in [place]. Patient engaged and participated appropriately.  Coulter Oldaker 08/15/2023, 8:22 PM

## 2023-08-15 NOTE — Progress Notes (Signed)
[facility] MD Progress Note  08/15/2023 12:37 PM Marie Atkins  MRN:  [id number]  Principal Problem: Severe recurrent major depressive disorder with psychotic features (HCC) Diagnosis: Principal Problem:   Severe recurrent major depressive disorder with psychotic features (HCC)  Reason for Admission:  Marie Atkins is a 52 y.o. female with a past psychiatric history of amphetamine use disorder, Tobacco dependence, Depression and.  Disorder who presented to the [facility] ED with suicidal ideation with plan to OD on Cocaine vs slit her wrist. She was admitted to the [facility] on 9/29.    On my assessment today, the patient appears brighter and less depressed.  She reports feeling less depressed now that she has been making contact with [facility] for several living.  She reports at this time she does not have an accepted bed at this time, but she is looking forward to contacting him this afternoon to see if there is an open bed soon.  She reports that sleep is better, anxiety is less.  She reports that appetite is okay and energy is better.  She reports that is helpful that she has been encouraged to be outside of her head by nursing staff.  She reports that suicidal thoughts are continue, off and on, passive without any intent or plan, and states that she last had suicidal thoughts about 2 hours prior to my evaluation this morning.  She reports that overall suicidal thoughts are less intense and less frequent, over the last 48 hours. Denies any HI.  Reports that AH is less intense and less frequent, noncommand.    No signs of TD (Tardive Dyskinesia) or EPS (Extrapyramidal Symptoms) were observed during the assessment, and the patient reports no feelings of stiffness. AIMS score: 0.      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 April 2024, and [facility] May 2024 Current/prior outpatient treatment: Fluoxetine 20 mg daily Prior rehab hx: [facility] rehabilitation in May 2024 Psychotherapy hx: Previously  History of suicide: Prior attempt in April 2024  History of homicide or aggression: Denies Psychiatric medication history: fluoxetine, risperidone  Psychiatric medication compliance history: Neuromodulation history:  Current Psychiatrist: Current therapist:     Past Medical History:  Past Medical History:  Diagnosis Date   Cocaine abuse (HCC)    Depression    HTN (hypertension)    Iron deficiency anemia     Past Surgical History:  Procedure Laterality Date   TUBAL LIGATION     Family History:  Family History  Problem Relation Age of Onset   Hypertension Mother    Diabetes Mother    CVA Mother    Heart attack Mother    Colon cancer Maternal Uncle    Family Psychiatric  History: See H&P  Social History:  Social History   Substance and Sexual Activity  Alcohol Use Yes   Alcohol/week: 42.0 standard drinks of alcohol   Types: 42 Cans of beer per week     Social History   Substance and Sexual Activity  Drug Use Yes   Types: Cocaine, "Crack" cocaine    Social History   Socioeconomic History   Marital status: Single    Spouse name: Not on file   Number of children: Not on file   Years of education: Not on  file   Highest education level: Not on file  Occupational History   Not on file  Tobacco Use   Smoking status: Every Day    Current packs/day: 0.25    Average packs/day: 0.3 packs/day for 36.0 years (9.0 ttl pk-yrs)    Types: Cigarettes   Smokeless tobacco: Never   Tobacco comments:    10 cigarettes/day  Vaping Use   Vaping status: Never Used  Substance and Sexual Activity   Alcohol use: Yes    Alcohol/week: 42.0 standard drinks of alcohol    Types: 42 Cans of beer per week    Drug use: Yes    Types: Cocaine, "Crack" cocaine   Sexual activity: Yes  Other Topics Concern   Not on file  Social History Narrative   Not on file   Social Determinants of Health   Financial Resource Strain: Not on File (02/28/2022)   Received from [employer], [place]   Financial Resource Strain    Financial Resource Strain: 0  Food Insecurity: Food Insecurity Present (08/10/2023)   Hunger Vital Sign    Worried About Running Out of Food in the Last Year: Sometimes true    Ran Out of Food in the Last Year: Sometimes true  Transportation Needs: Unmet Transportation Needs (08/10/2023)   PRAPARE - Administrator, Civil Service (Medical): Yes    Lack of Transportation (Non-Medical): Yes  Physical Activity: Not on File (02/28/2022)   Received from [place], [place]   Physical Activity    Physical Activity: 0  Stress: Not on File (02/28/2022)   Received from [facility], [place]   Stress    Stress: 0  Social Connections: Not on File (07/26/2023)   Received from [employer] [facility]   Social Connections    Connectedness: 0   Additional Social History:                           Current Medications: Current Facility-Administered Medications  Medication Dose Route Frequency Provider Last Rate Last Admin   acetaminophen (TYLENOL) tablet 650 mg  650 mg Oral Q6H PRN [name] C, NP   650 mg at 08/11/23 0803   alum & mag hydroxide-simeth (MAALOX/MYLANTA) 200-200-20 MG/5ML suspension 30 mL  30 mL Oral Q4H PRN [name] C, NP       diphenhydrAMINE (BENADRYL) capsule 50 mg  50 mg Oral TID PRN [name] C, NP       Or   diphenhydrAMINE (BENADRYL) injection 50 mg  50 mg Intramuscular TID PRN [name] C, NP       gabapentin (NEURONTIN) capsule 300 mg  300 mg Oral TID [name] C, NP   300 mg at 08/15/23 0827   haloperidol (HALDOL) tablet 5 mg  5 mg Oral TID PRN [name] C, NP       Or   haloperidol lactate (HALDOL) injection 5 mg  5 mg Intramuscular TID  PRN [name] C, NP       hydrOXYzine (ATARAX) tablet 25 mg  25 mg Oral TID PRN Ayari Liwanag, Harrold Donath, MD       ibuprofen (ADVIL) tablet 400 mg  400 mg Oral Q6H Peterson Ao, MD   400 mg at 08/14/23 2012   influenza vac split trivalent PF (FLULAVAL) injection 0.5 mL  0.5 mL Intramuscular Tomorrow-1000 Anaalicia Reimann, Harrold Donath, MD       levothyroxine (SYNTHROID) tablet 50 mcg  50 mcg Oral Q0600 [name] C, NP   50 mcg at  08/15/23 0634   LORazepam (ATIVAN) tablet 2 mg  2 mg Oral TID PRN [name] C, NP       Or   LORazepam (ATIVAN) injection 2 mg  2 mg Intramuscular TID PRN Earney Navy, NP       losartan (COZAAR) tablet 25 mg  25 mg Oral Daily Starleen Blue, NP   25 mg at 08/15/23 2956   magnesium hydroxide (MILK OF MAGNESIA) suspension 15 mL  15 mL Oral Daily PRN [name] C, NP   15 mL at 08/13/23 0829   melatonin tablet 3 mg  3 mg Oral QHS Cortina Vultaggio, Harrold Donath, MD   3 mg at 08/14/23 2123   nicotine (NICODERM CQ - dosed in mg/24 hours) patch 14 mg  14 mg Transdermal Daily Peterson Ao, MD       nicotine polacrilex (NICORETTE) gum 2 mg  2 mg Oral PRN Jenniffer Vessels, Harrold Donath, MD       pantoprazole (PROTONIX) EC tablet 40 mg  40 mg Oral Daily Onuoha, Josephine C, NP   40 mg at 08/15/23 2130   pneumococcal 20-valent conjugate vaccine (PREVNAR 20) injection 0.5 mL  0.5 mL Intramuscular Tomorrow-1000 Devanny Palecek, Harrold Donath, MD       risperiDONE (RISPERDAL) tablet 2 mg  2 mg Oral QHS Bennett, Christal H, NP   2 mg at 08/14/23 2012   sertraline (ZOLOFT) tablet 75 mg  75 mg Oral Daily Wilmetta Speiser, Harrold Donath, MD   75 mg at 08/15/23 0834   traZODone (DESYREL) tablet 50 mg  50 mg Oral QHS PRN Peterson Ao, MD        Lab Results:  No results found for this or any previous visit (from the past 48 hour(s)).   Blood Alcohol level:  Lab Results  Component Value Date   ETH <10 08/09/2023   ETH <10 06/23/2020    Metabolic Disorder Labs: Lab Results  Component Value Date    HGBA1C 5.3 08/12/2023   MPG 105 08/12/2023   MPG 91 03/05/2023   No results found for: "PROLACTIN" Lab Results  Component Value Date   CHOL 180 08/12/2023   TRIG 78 08/12/2023   HDL 66 08/12/2023   CHOLHDL 2.7 08/12/2023   VLDL 16 08/12/2023   LDLCALC 98 08/12/2023   LDLCALC 98 03/05/2023    Physical Findings: AIMS: Facial and Oral Movements Muscles of Facial Expression: None, normal Lips and Perioral Area: None, normal Jaw: None, normal Tongue: None, normal,Extremity Movements Upper (arms, wrists, hands, fingers): None, normal Lower (legs, knees, ankles, toes): None, normal, Trunk Movements Neck, shoulders, hips: None, normal, Overall Severity Severity of abnormal movements (highest score from questions above): None, normal Incapacitation due to abnormal movements: None, normal Patient's awareness of abnormal movements (rate only patient's report): No Awareness, Dental Status Current problems with teeth and/or dentures?: No Does patient usually wear dentures?: No  CIWA:    COWS:     Musculoskeletal: Strength & Muscle Tone: within normal limits Gait & Station: normal Patient leans: N/A  Psychiatric Specialty Exam:  Presentation  General Appearance:  Normal  Eye Contact: Fair  Speech: Normal Rate  Speech Volume: Decreased  Handedness: Right   Mood and Affect  Mood: Anxious; less depressed  Affect: Less flat, appearing more euthymic   Thought Process  Thought Processes: Linear  Descriptions of Associations:Intact  Orientation:Full (Time, Place and Person)  Thought Content:Logical  History of Schizophrenia/Schizoaffective disorder:No  Duration of Psychotic Symptoms:N/A  Hallucinations:Hallucinations: Auditory  Ideas of Reference:None  Suicidal Thoughts:Suicidal Thoughts: Reports having  passive suicidal thoughts without intent or plan  Homicidal Thoughts:Homicidal Thoughts: No   Sensorium  Memory: Immediate Good; Remote Good;  Recent Good  Judgment: Fair  Insight: Fair   Chartered certified accountant: Fair  Attention Span: Fair  Recall: Good  Fund of Knowledge: Good  Language: Good   Psychomotor Activity  Psychomotor Activity: Psychomotor Activity: Normal   Assets  Assets: Desire for Improvement; Resilience   Sleep  Sleep: Sleep: Fair    Physical Exam: Physical Exam Vitals and nursing note reviewed.  Constitutional:      General: She is not in acute distress. HENT:     Head: Normocephalic.     Nose: Nose normal.  Pulmonary:     Effort: Pulmonary effort is normal. No respiratory distress.  Musculoskeletal:        General: Normal range of motion.     Cervical back: Normal range of motion.  Neurological:     Mental Status: She is alert and oriented to person, place, and time.     Motor: No weakness.     Gait: Gait normal.    Review of Systems  Constitutional:  Negative for fever.  Respiratory:  Negative for wheezing.   Cardiovascular:  Negative for chest pain and palpitations.  Neurological:  Negative for tremors.  Psychiatric/Behavioral:  Positive for depression, substance abuse and suicidal ideas. The patient is nervous/anxious and has insomnia.   All other systems reviewed and are negative.  Blood pressure (!) 139/94, pulse 91, temperature 98.2 F (36.8 C), temperature source Oral, resp. rate 18, height 5\' 4"  (1.626 m), weight 82.4 kg, SpO2 100%. Body mass index is 31.17 kg/m.  Diagnoses / Active Problems:   Major Depressive Disorder, Recurrent, Severe, w psychotic features  GAD Stimulant  Use Disorder  Alcohol Use Disorder  Tobacco Use Disorder    PLAN: Safety and Monitoring:             --  Voluntary admission to inpatient psychiatric unit for safety, stabilization and treatment             -- Daily contact with patient to assess and evaluate symptoms and progress in treatment             -- Patient's case to be discussed in multi-disciplinary  team meeting             -- Observation Level : q15 minute checks             -- Vital signs:  q12 hours             -- Precautions: suicide, elopement, and assault   2. Psychiatric Diagnoses and Treatment:  -- Increase sertraline from 75 mg to 100 mg once daily for MDD -- Continue melatonin 3 mg daily at bedtime --Continue Risperdal 2 mg at bedtime for psychotic symptoms of mdd  -- Continue hydroxyzine 25 mg, three times daily, as needed for anxiety  --Consult Chaplain for emotional and spiritual support  (Previously discontinued prozac due to ineffectiveness)      -- The risks/benefits/side-effects/alternatives to this medication were discussed in detail with the patient and time was given for questions. The patient consents to medication trial.              -- Metabolic profile and EKG monitoring obtained while on an atypical antipsychotic (BMI: Lipid Panel: HbgA1c: QTc:) pending              -- Encouraged patient to participate in unit milieu and in  scheduled group therapies             3. Medical Issues Being Addressed:              Neuropathic pain -- Continue gabapentin 300 mg TID  GERD -- Continue Protonix 40 mg daily  Hypothyroidism:  -- Continue synthroid 50 mcg    Tobacco Use Disorder             -- Nicotine patch 14mg /24 hours ordered             -- Smoking cessation encouraged   4. Discharge Planning:              -- Social work and case management to assist with discharge planning and identification of hospital follow-up needs prior to discharge             -- Estimated LOS: 1-3 more days             -- Discharge Concerns: Need to establish a safety plan; Medication compliance and effectiveness             -- Discharge Goals: Return home with outpatient referrals for mental health follow-up including medication management/psychotherapy      Cristy Hilts, MD 08/15/2023, 12:37 PM  Total Time Spent in Direct Patient Care:  I personally spent 35  minutes on the unit in direct patient care. The direct patient care time included face-to-face time with the patient, reviewing the patient's chart, communicating with other professionals, and coordinating care. Greater than 50% of this time was spent in counseling or coordinating care with the patient regarding goals of hospitalization, psycho-education, and discharge planning needs.  Phineas Inches, MD

## 2023-08-15 NOTE — Plan of Care (Signed)
  Problem: Education: Goal: Knowledge of [place] [facility] Education information/materials will improve Outcome: Progressing Goal: Emotional status will improve Outcome: Progressing Goal: Mental status will improve Outcome: Progressing   

## 2023-08-16 DIAGNOSIS — F333 Major depressive disorder, recurrent, severe with psychotic symptoms: Secondary | ICD-10-CM | POA: Diagnosis not present

## 2023-08-16 MED ORDER — RISPERIDONE 1 MG PO TABS
1.0000 mg | ORAL_TABLET | Freq: Every day | ORAL | Status: DC
  Administered 2023-08-16: 1 mg via ORAL
  Filled 2023-08-16 (×2): qty 1

## 2023-08-16 MED ORDER — LOSARTAN POTASSIUM 50 MG PO TABS
50.0000 mg | ORAL_TABLET | Freq: Every day | ORAL | Status: DC
  Administered 2023-08-17 – 2023-08-19 (×3): 50 mg via ORAL
  Filled 2023-08-16 (×7): qty 1

## 2023-08-16 MED ORDER — IBUPROFEN 400 MG PO TABS
400.0000 mg | ORAL_TABLET | Freq: Three times a day (TID) | ORAL | Status: DC
  Administered 2023-08-16 – 2023-08-17 (×3): 400 mg via ORAL
  Filled 2023-08-16 (×13): qty 1

## 2023-08-16 MED ORDER — WHITE PETROLATUM EX OINT: TOPICAL_OINTMENT | CUTANEOUS | Status: DC | PRN

## 2023-08-16 MED ORDER — LOSARTAN POTASSIUM 25 MG PO TABS
25.0000 mg | ORAL_TABLET | Freq: Once | ORAL | Status: AC
  Administered 2023-08-16: 25 mg via ORAL
  Filled 2023-08-16: qty 1

## 2023-08-16 MED ORDER — WHITE PETROLATUM EX OINT
TOPICAL_OINTMENT | CUTANEOUS | Status: AC
  Filled 2023-08-16: qty 5

## 2023-08-16 NOTE — Plan of Care (Signed)

## 2023-08-16 NOTE — Group Note (Signed)
Date:  08/16/2023 Time:  5:17 PM  Group Topic/Focus:  Wellness Toolbox:   The focus of this group is to discuss various aspects of wellness, balancing those aspects and exploring ways to increase the ability to experience wellness.  Patients will create a wellness toolbox for use upon discharge.    Participation Level:  Minimal  Participation Quality:  Attentive  Affect:  Appropriate  Cognitive:  Appropriate  Insight: Good  Engagement in Group:  Limited  Modes of Intervention:  Exploration  Additional Comments:     Reymundo Poll 08/16/2023, 5:17 PM

## 2023-08-16 NOTE — Progress Notes (Addendum)
Patient presents with a flat affect but she was calm and  cooperative. Patient did not report any anxiety, depression, SI/HI or AVH. No pain reported as well. Patient requested for some Vaseline and also mentioned that her skin was breaking out and she wanted some cream for that as well.   Patients PM  scheduled medications administered per MD orders. Support and encouragement offered. Q15 mins checks carried out. Patient socializing with peers in front of her room.   Patient is able to vocalize needs Will continue to monitor patient .

## 2023-08-16 NOTE — Progress Notes (Addendum)
Patient ID: Marie Atkins, female   DOB: [birth year], 52 y.o.   MRN: [id number] Pt presents with depressed mood,affect blunted. [name] reports she is '' okay '' but reports concerns with new onset vaginal discharge and itching. Pt reports this is new symptom and informed writer would discuss with treatment team. She denies any SI HI or AV Hallucinations. She reports she did sleep well and is eating and drinking well. She has been isolative to her room this am, encouraged group compliance and also encouraged to complete self inventory form, in which she rates her depression at 3/10 , anxiety at 0/10 and hopelessness at 2/10 on scale, 10 being worst 0 being none. She reports her goal is '' finding long term treatment. Order also placed for urinalysis, collected and placed in refrigerator for pm lab collection.  She has been compliant with am medications. She is able to make her needs known. Above discussed with treatment team including MD, NP and SW. Pt is safe, will con't to monitor.

## 2023-08-16 NOTE — Progress Notes (Signed)
   08/15/23 2148  Psych Admission Type (Psych Patients Only)  Admission Status Voluntary  Psychosocial Assessment  Patient Complaints Anxiety  Eye Contact Fair  Facial Expression Flat  Affect Sad  Speech Soft  Interaction Assertive  Motor Activity Slow  Appearance/Hygiene Unremarkable  Behavior Characteristics Calm  Mood Anxious  Thought Process  Coherency WDL  Content WDL  Delusions None reported or observed  Perception WDL  Hallucination None reported or observed  Judgment Poor  Confusion None  Danger to Self  Current suicidal ideation? Denies  Agreement Not to Harm Self Yes  Description of Agreement verbal  Danger to Others  Danger to Others None reported or observed

## 2023-08-16 NOTE — Progress Notes (Addendum)
[facility] MD Progress Note  08/16/2023 2:06 PM Marie Atkins  MRN:  [id number]  Principal Problem: Severe recurrent major depressive disorder with psychotic features (HCC) Diagnosis: Principal Problem:   Severe recurrent major depressive disorder with psychotic features (HCC)  Reason for Admission:  Marie Atkins is a 52 y.o. female with a past psychiatric history of amphetamine use disorder, Tobacco dependence, Depression and.  Disorder who presented to the [facility] ED with suicidal ideation with plan to OD on Cocaine vs slit her wrist. She was admitted to the [facility] on 9/29.    Staff reports no behavioral issues.  She continues to be compliant with treatment.  She received nicotine gum for cravings.  She has been attending groups.  On my assessment today, the patient was noted to be alert oriented and cooperative.  She reported that she has not missed any groups yesterday and has been contacting several rehab facilities including [facility] [place] and has not heard back from them.  She is somewhat concerned that being on Risperdal may limit her chances of going into a residential facility.  She states that her appetite is okay and energy is okay and although she rates her depression at a 6/10 and passive suicidal ideation at the sleep/10 the voices are noted to be very vague and in the background.  She would like to taper the Risperdal to see if there is any change in her symptoms.  If not she would like to taper this off to give her better chances of getting into a residential rehab program    No signs of TD (Tardive Dyskinesia) or EPS (Extrapyramidal Symptoms) were observed during the assessment, and the patient reports no feelings of stiffness. AIMS score: 0.   Patient is complaining of some right flank pain.  Will do a urinalysis to assess for UTI.   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, medical use disorder, alcohol use disorder, cocaine use disorder Prior inpatient treatment: [facility] April 2024, and [facility] May 2024 Current/prior outpatient treatment: Fluoxetine 20 mg daily Prior rehab hx: [facility] in May 2024 Psychotherapy hx: Previously  History of suicide: Prior attempt in April 2024  History of homicide or aggression: Denies Psychiatric medication history: fluoxetine, risperidone  Psychiatric medication compliance history: Neuromodulation history:  Current Psychiatrist: Current therapist:     Past Medical History:  Past Medical History:  Diagnosis Date   Cocaine abuse (HCC)    Depression    HTN (hypertension)    Iron deficiency anemia     Past Surgical History:  Procedure Laterality Date   TUBAL LIGATION     Family History:  Family History  Problem Relation Age of Onset   Hypertension Mother    Diabetes Mother    CVA Mother    Heart attack Mother    Colon cancer Maternal Uncle    Family Psychiatric  History: See H&P  Social History:  Social History   Substance and Sexual Activity  Alcohol Use Yes   Alcohol/week: 42.0 standard drinks of alcohol   Types: 42 Cans of beer per week     Social History   Substance and Sexual Activity  Drug Use Yes   Types: Cocaine, "Crack" cocaine    Social History   Socioeconomic History   Marital status: Single    Spouse name: Not on file  Number of children: Not on file   Years of education: Not on file   Highest education level: Not on file  Occupational History   Not on file  Tobacco Use   Smoking status: Every Day    Current packs/day: 0.25    Average packs/day: 0.3 packs/day for 36.0 years (9.0 ttl pk-yrs)    Types: Cigarettes   Smokeless tobacco: Never   Tobacco comments:    10 cigarettes/day  Vaping Use   Vaping status: Never Used  Substance and Sexual Activity   Alcohol use: Yes     Alcohol/week: 42.0 standard drinks of alcohol    Types: 42 Cans of beer per week   Drug use: Yes    Types: Cocaine, "Crack" cocaine   Sexual activity: Yes  Other Topics Concern   Not on file  Social History Narrative   Not on file   Social Determinants of Health   Financial Resource Strain: Not on File (02/28/2022)   Received from [employer], [place]   Financial Resource Strain    Financial Resource Strain: 0  Food Insecurity: Food Insecurity Present (08/10/2023)   Hunger Vital Sign    Worried About Running Out of Food in the Last Year: Sometimes true    Ran Out of Food in the Last Year: Sometimes true  Transportation Needs: Unmet Transportation Needs (08/10/2023)   PRAPARE - Administrator, Civil Service (Medical): Yes    Lack of Transportation (Non-Medical): Yes  Physical Activity: Not on File (02/28/2022)   Received from [place], [place]   Physical Activity    Physical Activity: 0  Stress: Not on File (02/28/2022)   Received from [facility], [place]   Stress    Stress: 0  Social Connections: Not on File (07/26/2023)   Received from [facility]   Social Connections    Connectedness: 0   Additional Social History:                           Current Medications: Current Facility-Administered Medications  Medication Dose Route Frequency Provider Last Rate Last Admin   acetaminophen (TYLENOL) tablet 650 mg  650 mg Oral Q6H PRN [name] C, NP   650 mg at 08/11/23 0803   alum & mag hydroxide-simeth (MAALOX/MYLANTA) 200-200-20 MG/5ML suspension 30 mL  30 mL Oral Q4H PRN [name] C, NP       diphenhydrAMINE (BENADRYL) capsule 50 mg  50 mg Oral TID PRN [name] C, NP       Or   diphenhydrAMINE (BENADRYL) injection 50 mg  50 mg Intramuscular TID PRN [name] C, NP       gabapentin (NEURONTIN) capsule 300 mg  300 mg Oral TID [name] C, NP   300 mg at 08/16/23 1140   haloperidol (HALDOL) tablet 5 mg  5 mg Oral TID PRN [name]  C, NP       Or   haloperidol lactate (HALDOL) injection 5 mg  5 mg Intramuscular TID PRN [name] C, NP       hydrOXYzine (ATARAX) tablet 25 mg  25 mg Oral TID PRN Massengill, Harrold Donath, MD       ibuprofen (ADVIL) tablet 400 mg  400 mg Oral TID Lauro Franklin, MD   400 mg at 08/16/23 1140   influenza vac split trivalent PF (FLULAVAL) injection 0.5 mL  0.5 mL Intramuscular Tomorrow-1000 Massengill, Harrold Donath, MD       levothyroxine (SYNTHROID) tablet 50 mcg  50 mcg Oral Q0600 [name] C, NP   50 mcg at 08/16/23 9562   LORazepam (ATIVAN) tablet 2 mg  2 mg Oral TID PRN Earney Navy, NP       Or   LORazepam (ATIVAN) injection 2 mg  2 mg Intramuscular TID PRN Earney Navy, NP       losartan (COZAAR) tablet 25 mg  25 mg Oral Daily Starleen Blue, NP   25 mg at 08/16/23 0744   magnesium hydroxide (MILK OF MAGNESIA) suspension 15 mL  15 mL Oral Daily PRN [name] C, NP   15 mL at 08/13/23 0829   melatonin tablet 3 mg  3 mg Oral QHS Massengill, Harrold Donath, MD   3 mg at 08/15/23 2113   nicotine (NICODERM CQ - dosed in mg/24 hours) patch 14 mg  14 mg Transdermal Daily Peterson Ao, MD       nicotine polacrilex (NICORETTE) gum 2 mg  2 mg Oral PRN Phineas Inches, MD   2 mg at 08/16/23 0746   pantoprazole (PROTONIX) EC tablet 40 mg  40 mg Oral Daily [name] C, NP   40 mg at 08/16/23 0744   pneumococcal 20-valent conjugate vaccine (PREVNAR 20) injection 0.5 mL  0.5 mL Intramuscular Tomorrow-1000 Massengill, Harrold Donath, MD       risperiDONE (RISPERDAL) tablet 1 mg  1 mg Oral QHS Rex Kras, MD       sertraline (ZOLOFT) tablet 100 mg  100 mg Oral Daily Massengill, Harrold Donath, MD   100 mg at 08/16/23 0744   traZODone (DESYREL) tablet 50 mg  50 mg Oral QHS PRN Peterson Ao, MD        Lab Results:  No results found for this or any previous visit (from the past 48 hour(s)).   Blood Alcohol level:  Lab Results  Component Value Date   ETH <10 08/09/2023    ETH <10 06/23/2020    Metabolic Disorder Labs: Lab Results  Component Value Date   HGBA1C 5.3 08/12/2023   MPG 105 08/12/2023   MPG 91 03/05/2023   No results found for: "PROLACTIN" Lab Results  Component Value Date   CHOL 180 08/12/2023   TRIG 78 08/12/2023   HDL 66 08/12/2023   CHOLHDL 2.7 08/12/2023   VLDL 16 08/12/2023   LDLCALC 98 08/12/2023   LDLCALC 98 03/05/2023    Physical Findings: AIMS: Facial and Oral Movements Muscles of Facial Expression: None, normal Lips and Perioral Area: None, normal Jaw: None, normal Tongue: None, normal,Extremity Movements Upper (arms, wrists, hands, fingers): None, normal Lower (legs, knees, ankles, toes): None, normal, Trunk Movements Neck, shoulders, hips: None, normal, Overall Severity Severity of abnormal movements (highest score from questions above): None, normal Incapacitation due to abnormal movements: None, normal Patient's awareness of abnormal movements (rate only patient's report): No Awareness, Dental Status Current problems with teeth and/or dentures?: No Does patient usually wear dentures?: No  CIWA:  CIWA-Ar Total: 0 COWS:     Musculoskeletal: Strength & Muscle Tone: within normal limits Gait & Station: normal Patient leans: N/A  Psychiatric Specialty Exam:  Presentation  General Appearance:  Normal  Eye Contact: Fair  Speech: Clear and Coherent  Speech Volume: Decreased  Handedness: Right   Mood and Affect  Mood: Anxious; less depressed  Affect: Less flat, appearing more euthymic   Thought Process  Thought Processes: Coherent  Descriptions of Associations:Intact  Orientation:Full (Time, Place and Person)  Thought Content:Perseveration  History of Schizophrenia/Schizoaffective disorder:No  Duration of Psychotic  Symptoms:N/A  Hallucinations:Hallucinations: Auditory  Ideas of Reference:None  Suicidal Thoughts:Suicidal Thoughts: Reports having passive suicidal thoughts  without intent or plan  Homicidal Thoughts:Homicidal Thoughts: No   Sensorium  Memory: Immediate Fair; Recent Fair; Remote Fair  Judgment: Fair  Insight: Fair   Art therapist  Concentration: Fair  Attention Span: Fair  Recall: [employer] of Knowledge: Fair  Language: Fair   Psychomotor Activity  Psychomotor Activity: Psychomotor Activity: Normal   Assets  Assets: Desire for Improvement; Communication Skills; Resilience   Sleep  Sleep: Sleep: Fair    Physical Exam: Physical Exam Vitals and nursing note reviewed.  Constitutional:      General: She is not in acute distress. HENT:     Head: Normocephalic.     Nose: Nose normal.  Pulmonary:     Effort: Pulmonary effort is normal. No respiratory distress.  Musculoskeletal:        General: Normal range of motion.     Cervical back: Normal range of motion.  Neurological:     Mental Status: She is alert and oriented to person, place, and time.     Motor: No weakness.     Gait: Gait normal.    Review of Systems  Constitutional:  Negative for fever.  Respiratory:  Negative for wheezing.   Cardiovascular:  Negative for chest pain and palpitations.  Neurological:  Negative for tremors.  Psychiatric/Behavioral:  Positive for depression, substance abuse and suicidal ideas. The patient is nervous/anxious and has insomnia.   All other systems reviewed and are negative.  Blood pressure (!) 137/96, pulse 98, temperature 97.9 F (36.6 C), resp. rate 18, height 5\' 4"  (1.626 m), weight 82.4 kg, SpO2 100%. Body mass index is 31.17 kg/m.  Diagnoses / Active Problems:   Major Depressive Disorder, Recurrent, Severe, w psychotic features  GAD Stimulant  Use Disorder  Alcohol Use Disorder  Tobacco Use Disorder    PLAN: Safety and Monitoring:             --  Voluntary admission to inpatient psychiatric unit for safety, stabilization and treatment             -- Daily contact with patient to assess  and evaluate symptoms and progress in treatment             -- Patient's case to be discussed in multi-disciplinary team meeting             -- Observation Level : q15 minute checks             -- Vital signs:  q12 hours             -- Precautions: suicide, elopement, and assault   2. Psychiatric Diagnoses and Treatment:  -- Increase sertraline from 75 mg to 100 mg once daily for MDD -- Continue melatonin 3 mg daily at bedtime -- Taper Risperdal 1 mg at bedtime for psychotic symptoms of mdd  -- Continue hydroxyzine 25 mg, three times daily, as needed for anxiety  --Consult Chaplain for emotional and spiritual support      -- The risks/benefits/side-effects/alternatives to this medication were discussed in detail with the patient and time was given for questions. The patient consents to medication trial.              -- Metabolic profile and EKG monitoring obtained while on an atypical antipsychotic (BMI: Lipid Panel: HbgA1c: QTc:) pending              -- Encouraged patient  to participate in unit milieu and in scheduled group therapies             3. Medical Issues Being Addressed:              Neuropathic pain -- Continue gabapentin 300 mg TID  GERD -- Continue Protonix 40 mg daily  Hypothyroidism:  -- Continue synthroid 50 mcg    Tobacco Use Disorder             -- Nicotine patch 14mg /24 hours ordered             -- Smoking cessation encouraged   4. Discharge Planning:              -- Social work and case management to assist with discharge planning and identification of hospital follow-up needs prior to discharge             -- Estimated LOS: Possibly by Monday or Tuesday.  Patient continues to seek a residential facility for rehab.             -- Discharge Concerns: Need to establish a safety plan; Medication compliance and effectiveness             -- Discharge Goals: Return home with outpatient referrals for mental health follow-up including medication  management/psychotherapy      Rex Kras, MD 08/16/2023, 2:06 PM  Total Time Spent in Direct Patient Care:  I personally spent 35 minutes on the unit in direct patient care. The direct patient care time included face-to-face time with the patient, reviewing the patient's chart, communicating with other professionals, and coordinating care. Greater than 50% of this time was spent in counseling or coordinating care with the patient regarding goals of hospitalization, psycho-education, and discharge planning needs.  Phineas Inches, MD  Patient ID: Penne Lash, female   DOB: 1970-11-18, 52 y.o.   MRN: [id number]

## 2023-08-16 NOTE — Group Note (Signed)
Date:  08/16/2023 Time:  9:58 AM  Group Topic/Focus:  Goals Group:   The focus of this group is to help patients establish daily goals to achieve during treatment and discuss how the patient can incorporate goal setting into their daily lives to aide in recovery.    Participation Level:   Did not attend   Participation Quality:      Affect:      Cognitive:      Insight: None  Engagement in Group:      Modes of Intervention:      Additional Comments:     Reymundo Poll 08/16/2023, 9:58 AM

## 2023-08-16 NOTE — [facility] Group Notes (Signed)
[facility] Group Notes:  (Nursing/MHT/Case Management/Adjunct)  Date:  08/16/2023  Time:  9:07 PM  Type of Therapy:   Wrap-up group  Participation Level:  Active  Participation Quality:  Appropriate  Affect:  Appropriate  Cognitive:  Appropriate  Insight:  Appropriate  Engagement in Group:  Engaged  Modes of Intervention:  Education  Summary of Progress/Problems: Pt goal to start new beginning. Pt rated day 9/10.  Marie Atkins 08/16/2023, 9:07 PM

## 2023-08-17 DIAGNOSIS — F333 Major depressive disorder, recurrent, severe with psychotic symptoms: Secondary | ICD-10-CM | POA: Diagnosis not present

## 2023-08-17 LAB — URINALYSIS, ROUTINE W REFLEX MICROSCOPIC
Bilirubin Urine: NEGATIVE
Glucose, UA: NEGATIVE mg/dL
Hgb urine dipstick: NEGATIVE
Ketones, ur: NEGATIVE mg/dL
Leukocytes,Ua: NEGATIVE
Nitrite: NEGATIVE
Protein, ur: NEGATIVE mg/dL
Specific Gravity, Urine: 1.015 (ref 1.005–1.030)
pH: 5 (ref 5.0–8.0)

## 2023-08-17 NOTE — Progress Notes (Addendum)
Patient ID: Marie Atkins, female   DOB: Jun 23, 1971, 52 y.o.   MRN: [id number] Patient presents with depressed mood, affect congruent. [name] reports she is still having intermittent dizziness,and noted elevated BP. Pt reports she is eating and drinking well.  BP reassessed and pt accepted medication. She reports not sleeping well last night and feeling '' tired and not that great '' this am.  She denies any SI HI and no signs pt is responding to internal stimuli. Above discussed with treatment team this am including BP issues with NP, MD and SW. Pt did complete self inventory and rates her depression at 2/10, anxiety at 0/10 and hopelessness at 3/10 on scale 10 being worst 0 being none. She states her goal is to stay positive and ''stop thinking negative about myself all the time. ''  Pt is safe, able to make her needs known. Will con't to monitor.

## 2023-08-17 NOTE — [facility] Group Notes (Signed)
[facility] Group Notes:  (Nursing/MHT/Case Management/Adjunct)  Date:  08/17/2023  Time:  2:50 PM  Type of Therapy:  Psychoeducational Skills  Participation Level:  Active  Participation Quality:  Appropriate  Affect:  Appropriate  Cognitive:  Alert and Appropriate  Insight:  Appropriate  Engagement in Group:  Engaged  Modes of Intervention:  Discussion, Education, and Exploration  Summary of Progress/Problems: A mental health wellness podcast was play ''Berniece Pap On Purpose'' with themes of recognizing impulsive behaviors and how to implement the 90 second rule. Patients were then educated on positive reframing and how to recognize negative thinking patterns. Pt attended and was appropriate.   Malva Limes 08/17/2023, 2:50 PM

## 2023-08-17 NOTE — Progress Notes (Signed)
D) Pt received calm, visible, participating in milieu, and in no acute distress. Pt A & O x4. Pt denies SI, HI, A/ V H, depression, anxiety and pain at this time. A) Pt encouraged to drink fluids. Pt encouraged to come to staff with needs. Pt encouraged to attend and participate in groups. Pt encouraged to set reachable goals.  R) Pt remained safe on unit, in no acute distress, will continue to assess.     08/17/23 2100  Psych Admission Type (Psych Patients Only)  Admission Status Voluntary  Psychosocial Assessment  Patient Complaints Anxiety  Eye Contact Fair  Facial Expression Flat  Affect Blunted  Speech Soft  Interaction Assertive  Motor Activity Slow  Appearance/Hygiene Improved  Behavior Characteristics Calm  Mood Depressed  Thought Process  Coherency WDL  Content WDL  Delusions None reported or observed  Perception WDL  Hallucination None reported or observed  Judgment Poor  Confusion None  Danger to Self  Current suicidal ideation? Denies  Agreement Not to Harm Self Yes  Description of Agreement verbal  Danger to Others  Danger to Others None reported or observed

## 2023-08-17 NOTE — Progress Notes (Signed)
[facility] MD Progress Note  08/17/2023 10:23 AM Marie Atkins  MRN:  [id number]  Principal Problem: Severe recurrent major depressive disorder with psychotic features (HCC) Diagnosis: Principal Problem:   Severe recurrent major depressive disorder with psychotic features (HCC)  Reason for Admission:  Marie Atkins is a 52 y.o. female with a past psychiatric history of amphetamine use disorder, Tobacco dependence, Depression and.  Disorder who presented to the [facility] ED with suicidal ideation with plan to OD on Cocaine vs slit her wrist. She was admitted to the [facility] on 9/29.    Staff reports no behavioral issues.  Her blood pressure has been trending high.  The patient was on losartan 25 mg a day but apparently was taking 100 mg when she was at home.  This was increased to 50 mg.  She is attending groups.  When seen today the patient was alert oriented and cooperative.  She maintained fair to good eye contact.  She remains anxious about discharge and wanting to go to a rehab facility but is concerned that being on the Risperdal might impact her chances of being in a rehab program.  She has agreed to taper off the Risperdal and to reassess if her hallucinations have improved.  She also indicates that generally the voices tend to be worse when she is using drugs and alcohol.  Risperdal was tapered from 2 mg to 1 mg at night with no change in her symptoms.  She would like to hold the Risperdal tonight and see if this no significant change.  She feels that this might give her a better chance of being accepted in a rehab facility.    No signs of TD (Tardive Dyskinesia) or EPS (Extrapyramidal Symptoms) were observed during the assessment, and the patient reports no feelings of stiffness. AIMS score: 0.   Patient was complaining of some right flank pain yesterday.  Urinalysis was done which was noted to be negative.  Patient has not complained of right flank pain today.   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, medical use disorder, alcohol use disorder, cocaine use disorder Prior inpatient treatment: [facility] April 2024, and [facility] May 2024 Current/prior outpatient treatment: Fluoxetine 20 mg daily Prior rehab hx: [facility] rehabilitation in May 2024 Psychotherapy hx: Previously  History of suicide: Prior attempt in April 2024  History of homicide or aggression: Denies Psychiatric medication history: fluoxetine, risperidone  Psychiatric medication compliance history: Neuromodulation history:  Current Psychiatrist: Current therapist:     Past Medical History:  Past Medical History:  Diagnosis Date   Cocaine abuse (HCC)    Depression    HTN (hypertension)    Iron deficiency anemia     Past Surgical History:  Procedure Laterality Date   TUBAL LIGATION     Family History:  Family History  Problem Relation Age of Onset   Hypertension Mother    Diabetes Mother    CVA Mother    Heart attack Mother    Colon cancer Maternal Uncle    Family Psychiatric  History: See H&P  Social History:  Social History   Substance and Sexual Activity  Alcohol Use Yes   Alcohol/week: 42.0 standard drinks of alcohol   Types: 42 Cans of beer per week     Social History   Substance and Sexual Activity  Drug Use Yes  Types: Cocaine, "Crack" cocaine    Social History   Socioeconomic History   Marital status: Single    Spouse name: Not on file   Number of children: Not on file   Years of education: Not on file   Highest education level: Not on file  Occupational History   Not on file  Tobacco Use   Smoking status: Every Day    Current packs/day: 0.25    Average packs/day: 0.3 packs/day for 36.0 years (9.0 ttl pk-yrs)    Types: Cigarettes   Smokeless tobacco: Never   Tobacco comments:    10  cigarettes/day  Vaping Use   Vaping status: Never Used  Substance and Sexual Activity   Alcohol use: Yes    Alcohol/week: 42.0 standard drinks of alcohol    Types: 42 Cans of beer per week   Drug use: Yes    Types: Cocaine, "Crack" cocaine   Sexual activity: Yes  Other Topics Concern   Not on file  Social History Narrative   Not on file   Social Determinants of Health   Financial Resource Strain: Not on File (02/28/2022)   Received from [employer] [facility], [place]   Financial Resource Strain    Financial Resource Strain: 0  Food Insecurity: Food Insecurity Present (08/10/2023)   Hunger Vital Sign    Worried About Running Out of Food in the Last Year: Sometimes true    Ran Out of Food in the Last Year: Sometimes true  Transportation Needs: Unmet Transportation Needs (08/10/2023)   PRAPARE - Administrator, Civil Service (Medical): Yes    Lack of Transportation (Non-Medical): Yes  Physical Activity: Not on File (02/28/2022)   Received from [place], [place]   Physical Activity    Physical Activity: 0  Stress: Not on File (02/28/2022)   Received from [facility], [place]   Stress    Stress: 0  Social Connections: Not on File (07/26/2023)   Received from [facility]   Social Connections    Connectedness: 0   Additional Social History:                           Current Medications: Current Facility-Administered Medications  Medication Dose Route Frequency Provider Last Rate Last Admin   acetaminophen (TYLENOL) tablet 650 mg  650 mg Oral Q6H PRN [name] C, NP   650 mg at 08/11/23 0803   alum & mag hydroxide-simeth (MAALOX/MYLANTA) 200-200-20 MG/5ML suspension 30 mL  30 mL Oral Q4H PRN [name] C, NP       diphenhydrAMINE (BENADRYL) capsule 50 mg  50 mg Oral TID PRN [name] C, NP       Or   diphenhydrAMINE (BENADRYL) injection 50 mg  50 mg Intramuscular TID PRN [name] C, NP       gabapentin (NEURONTIN) capsule 300 mg  300 mg Oral TID [name] C, NP   300 mg at 08/17/23 0743   haloperidol (HALDOL) tablet 5 mg  5 mg Oral TID PRN [name] C, NP       Or   haloperidol lactate (HALDOL) injection 5 mg  5 mg Intramuscular TID PRN [name] C, NP       hydrOXYzine (ATARAX) tablet 25 mg  25 mg Oral TID PRN Massengill, Harrold Donath, MD       ibuprofen (ADVIL) tablet 400 mg  400 mg Oral TID Lauro Franklin, MD   400 mg at 08/16/23 2101  influenza vac split trivalent PF (FLULAVAL) injection 0.5 mL  0.5 mL Intramuscular Tomorrow-1000 Massengill, Harrold Donath, MD       levothyroxine (SYNTHROID) tablet 50 mcg  50 mcg Oral Q0600 [name] C, NP   50 mcg at 08/17/23 0622   LORazepam (ATIVAN) tablet 2 mg  2 mg Oral TID PRN Earney Navy, NP       Or   LORazepam (ATIVAN) injection 2 mg  2 mg Intramuscular TID PRN [name] C, NP       losartan (COZAAR) tablet 50 mg  50 mg Oral Daily Rex Kras, MD   50 mg at 08/17/23 0914   magnesium hydroxide (MILK OF MAGNESIA) suspension 15 mL  15 mL Oral Daily PRN [name] C, NP   15 mL at 08/13/23 0829   melatonin tablet 3 mg  3 mg Oral QHS Massengill, Harrold Donath, MD   3 mg at 08/16/23 2101   nicotine (NICODERM CQ - dosed in mg/24 hours) patch 14 mg  14 mg Transdermal Daily Peterson Ao, MD       nicotine polacrilex (NICORETTE) gum 2 mg  2 mg Oral PRN Massengill, Harrold Donath, MD   2 mg at 08/16/23 0746   pantoprazole (PROTONIX) EC tablet 40 mg  40 mg Oral Daily [name] C, NP   40 mg at 08/17/23 0743   pneumococcal 20-valent conjugate vaccine (PREVNAR 20) injection 0.5 mL  0.5 mL Intramuscular Tomorrow-1000 Massengill, Harrold Donath, MD       risperiDONE (RISPERDAL) tablet 1 mg  1 mg Oral QHS Rex Kras, MD   1 mg at 08/16/23 2101   sertraline (ZOLOFT) tablet 100 mg  100 mg Oral Daily Massengill, Harrold Donath, MD   100 mg at 08/17/23 0743   traZODone (DESYREL) tablet 50 mg  50 mg Oral QHS PRN Peterson Ao, MD       white petrolatum (VASELINE) gel   Topical  PRN Jearld Lesch, NP        Lab Results:  No results found for this or any previous visit (from the past 48 hour(s)).   Blood Alcohol level:  Lab Results  Component Value Date   ETH <10 08/09/2023   ETH <10 06/23/2020    Metabolic Disorder Labs: Lab Results  Component Value Date   HGBA1C 5.3 08/12/2023   MPG 105 08/12/2023   MPG 91 03/05/2023   No results found for: "PROLACTIN" Lab Results  Component Value Date   CHOL 180 08/12/2023   TRIG 78 08/12/2023   HDL 66 08/12/2023   CHOLHDL 2.7 08/12/2023   VLDL 16 08/12/2023   LDLCALC 98 08/12/2023   LDLCALC 98 03/05/2023    Physical Findings: AIMS: Facial and Oral Movements Muscles of Facial Expression: None, normal Lips and Perioral Area: None, normal Jaw: None, normal Tongue: None, normal,Extremity Movements Upper (arms, wrists, hands, fingers): None, normal Lower (legs, knees, ankles, toes): None, normal, Trunk Movements Neck, shoulders, hips: None, normal, Overall Severity Severity of abnormal movements (highest score from questions above): None, normal Incapacitation due to abnormal movements: None, normal Patient's awareness of abnormal movements (rate only patient's report): No Awareness, Dental Status Current problems with teeth and/or dentures?: No Does patient usually wear dentures?: No  CIWA:  CIWA-Ar Total: 0 COWS:     Musculoskeletal: Strength & Muscle Tone: within normal limits Gait & Station: normal Patient leans: N/A  Psychiatric Specialty Exam:  Presentation  General Appearance:  Normal  Eye Contact: Fair  Speech: Clear and Coherent  Speech Volume: Normal  Handedness: Right   Mood and Affect  Mood: Anxious; less depressed  Affect: Less flat, appearing more euthymic   Thought Process  Thought Processes: Coherent  Descriptions of Associations:Intact  Orientation:Full (Time, Place and Person)  Thought Content:Perseveration; Rumination  History of  Schizophrenia/Schizoaffective disorder:No  Duration of Psychotic Symptoms:N/A  Hallucinations:Hallucinations: Auditory  Ideas of Reference:None  Suicidal Thoughts:Suicidal Thoughts: Reports having passive suicidal thoughts without intent or plan  Homicidal Thoughts:Homicidal Thoughts: No   Sensorium  Memory: Immediate Fair; Remote Fair; Recent Fair  Judgment: Fair  Insight: Fair   Chartered certified accountant: Fair  Attention Span: Fair  Recall: [employer] of Knowledge: Fair  Language: Fair   Psychomotor Activity  Psychomotor Activity: Psychomotor Activity: Normal   Assets  Assets: Communication Skills; Desire for Improvement   Sleep  Sleep: Sleep: Fair    Physical Exam: Physical Exam Vitals and nursing note reviewed.  Constitutional:      General: She is not in acute distress. HENT:     Head: Normocephalic.     Nose: Nose normal.  Pulmonary:     Effort: Pulmonary effort is normal. No respiratory distress.  Musculoskeletal:        General: Normal range of motion.     Cervical back: Normal range of motion.  Neurological:     Mental Status: She is alert and oriented to person, place, and time.     Motor: No weakness.     Gait: Gait normal.    Review of Systems  Constitutional:  Negative for fever.  Respiratory:  Negative for wheezing.   Cardiovascular:  Negative for chest pain and palpitations.  Neurological:  Negative for tremors.  Psychiatric/Behavioral:  Positive for depression, substance abuse and suicidal ideas. The patient is nervous/anxious and has insomnia.   All other systems reviewed and are negative.  Blood pressure 139/72, pulse 87, temperature 98.2 F (36.8 C), temperature source Oral, resp. rate 17, height 5\' 4"  (1.626 m), weight 82.4 kg, SpO2 97%. Body mass index is 31.17 kg/m.  Diagnoses / Active Problems:   Major Depressive Disorder, Recurrent, Severe, w psychotic features  GAD Stimulant  Use Disorder   Alcohol Use Disorder  Tobacco Use Disorder    PLAN: Safety and Monitoring:             --  Voluntary admission to inpatient psychiatric unit for safety, stabilization and treatment             -- Daily contact with patient to assess and evaluate symptoms and progress in treatment             -- Patient's case to be discussed in multi-disciplinary team meeting             -- Observation Level : q15 minute checks             -- Vital signs:  q12 hours             -- Precautions: suicide, elopement, and assault   2. Psychiatric Diagnoses and Treatment:  -- Increase sertraline from 75 mg to 100 mg once daily for MDD -- Continue melatonin 3 mg daily at bedtime -- We will hold Risperdal tonight and reassess the need for ongoing antipsychotic.  Patient feels that this might give her a better chance of getting into a rehab facility. -- Continue hydroxyzine 25 mg, three times daily, as needed for anxiety  --Consult Chaplain for emotional and spiritual support      -- The risks/benefits/side-effects/alternatives to  this medication were discussed in detail with the patient and time was given for questions. The patient consents to medication trial.              -- Metabolic profile and EKG monitoring obtained while on an atypical antipsychotic (BMI: Lipid Panel: HbgA1c: QTc:) pending              -- Encouraged patient to participate in unit milieu and in scheduled group therapies             3. Medical Issues Being Addressed:              Neuropathic pain -- Continue gabapentin 300 mg TID  GERD -- Continue Protonix 40 mg daily  Hypothyroidism:  -- Continue synthroid 50 mcg    Tobacco Use Disorder             -- Nicotine patch 14mg /24 hours ordered             -- Smoking cessation encouraged   4. Discharge Planning:              -- Social work and case management to assist with discharge planning and identification of hospital follow-up needs prior to discharge             --  Estimated LOS: Possibly by Monday or Tuesday.  Patient continues to seek a residential facility for rehab.             -- Discharge Concerns: Need to establish a safety plan; Medication compliance and effectiveness             -- Discharge Goals: Return home with outpatient referrals for mental health follow-up including medication management/psychotherapy      Rex Kras, MD 08/17/2023, 10:23 AM  Total Time Spent in Direct Patient Care:  I personally spent 35 minutes on the unit in direct patient care. The direct patient care time included face-to-face time with the patient, reviewing the patient's chart, communicating with other professionals, and coordinating care. Greater than 50% of this time was spent in counseling or coordinating care with the patient regarding goals of hospitalization, psycho-education, and discharge planning needs.  Phineas Inches, MD  Patient ID: Marie Atkins, female   DOB: [birth year], 52 y.o.   MRN: [id number] Patient ID: Marie Atkins, female   DOB: 03-Dec-1970, 52 y.o.   MRN: [id number]

## 2023-08-17 NOTE — Plan of Care (Signed)
Problem: Education: Goal: Knowledge of [facility] Education information/materials will improve Outcome: Progressing Goal: Emotional status will improve Outcome: Progressing Goal: Mental status will improve Outcome: Progressing Goal: Verbalization of understanding the information provided will improve Outcome: Progressing   Problem: Activity: Goal: Interest or engagement in activities will improve Outcome: Progressing Goal: Sleeping patterns will improve Outcome: Progressing   Problem: Coping: Goal: Ability to verbalize frustrations and anger appropriately will improve Outcome: Progressing Goal: Ability to demonstrate self-control will improve Outcome: Progressing   Problem: Health Behavior/Discharge Planning: Goal: Identification of resources available to assist in meeting health care needs will improve Outcome: Progressing Goal: Compliance with treatment plan for underlying cause of condition will improve Outcome: Progressing   Problem: Physical Regulation: Goal: Ability to maintain clinical measurements within normal limits will improve Outcome: Progressing   Problem: Safety: Goal: Periods of time without injury will increase Outcome: Progressing   Problem: Education: Goal: Utilization of techniques to improve thought processes will improve Outcome: Progressing Goal: Knowledge of the prescribed therapeutic regimen will improve Outcome: Progressing   Problem: Activity: Goal: Interest or engagement in leisure activities will improve Outcome: Progressing Goal: Imbalance in normal sleep/wake cycle will improve Outcome: Progressing   Problem: Coping: Goal: Coping ability will improve Outcome: Progressing Goal: Will verbalize feelings Outcome: Progressing   Problem: Health Behavior/Discharge Planning: Goal: Ability to make decisions will improve Outcome: Progressing Goal: Compliance with therapeutic regimen will improve Outcome: Progressing    Problem: Role Relationship: Goal: Will demonstrate positive changes in social behaviors and relationships Outcome: Progressing   Problem: Safety: Goal: Ability to disclose and discuss suicidal ideas will improve Outcome: Progressing Goal: Ability to identify and utilize support systems that promote safety will improve Outcome: Progressing   Problem: Self-Concept: Goal: Will verbalize positive feelings about self Outcome: Progressing Goal: Level of anxiety will decrease Outcome: Progressing   Problem: Education: Goal: Ability to make informed decisions regarding treatment will improve Outcome: Progressing   Problem: Coping: Goal: Coping ability will improve Outcome: Progressing   Problem: Health Behavior/Discharge Planning: Goal: Identification of resources available to assist in meeting health care needs will improve Outcome: Progressing   Problem: Medication: Goal: Compliance with prescribed medication regimen will improve Outcome: Progressing   Problem: Self-Concept: Goal: Ability to disclose and discuss suicidal ideas will improve Outcome: Progressing Goal: Will verbalize positive feelings about self Outcome: Progressing Note:     Problem: Education: Goal: Knowledge of General Education information will improve Description: Including pain rating scale, medication(s)/side effects and non-pharmacologic comfort measures Outcome: Progressing   Problem: Health Behavior/Discharge Planning: Goal: Ability to manage health-related needs will improve Outcome: Progressing   Problem: Clinical Measurements: Goal: Ability to maintain clinical measurements within normal limits will improve Outcome: Progressing Goal: Will remain free from infection Outcome: Progressing Goal: Diagnostic test results will improve Outcome: Progressing Goal: Respiratory complications will improve Outcome: Progressing Goal: Cardiovascular complication will be avoided Outcome: Progressing    Problem: Activity: Goal: Risk for activity intolerance will decrease Outcome: Progressing   Problem: Nutrition: Goal: Adequate nutrition will be maintained Outcome: Progressing   Problem: Coping: Goal: Level of anxiety will decrease Outcome: Progressing   Problem: Elimination: Goal: Will not experience complications related to bowel motility Outcome: Progressing Goal: Will not experience complications related to urinary retention Outcome: Progressing   Problem: Pain Managment: Goal: General experience of comfort will improve Outcome: Progressing   Problem: Safety: Goal: Ability to remain free from injury will improve Outcome: Progressing   Problem: Skin Integrity: Goal: Risk for impaired skin  integrity will decrease Outcome: Progressing

## 2023-08-17 NOTE — Plan of Care (Signed)

## 2023-08-17 NOTE — [facility] Group Notes (Signed)
Type of Therapy and Topic:  Group Therapy:Gratitude  Participation Level:  Did Not Attend   Description of Group:   In this group, patients shared and discussed the importance of acknowledging the elements in their lives for which they are grateful and how this can positively impact their mood.  The group discussed how bringing the positive elements of their lives to the forefront of their minds can help with recovery from any illness, physical or mental.  An exercise was done as a group in which a list was made of gratitude items in order to encourage participants to consider other potential positives in their lives.  Therapeutic Goals: Patients will identify one or more item for which they are grateful in each of 6 categories:  people, experiences, things, places, skills, and other. Patients will discuss how it is possible to seek out gratitude in even bad situations. Patients will explore other possible items of gratitude that they could remember.   Summary of Patient Progress: NA  Therapeutic Modalities:   Solution-Focused Therapy Activity

## 2023-08-17 NOTE — Group Note (Signed)
Date:  08/17/2023 Time:  2:02 PM  Group Topic/Focus:  Goals Group:   The focus of this group is to help patients establish daily goals to achieve during treatment and discuss how the patient can incorporate goal setting into their daily lives to aide in recovery.    Participation Level:  Active  Participation Quality:  Appropriate  Affect:  Appropriate  Cognitive:  Appropriate  Insight: Good  Engagement in Group:  Engaged  Modes of Intervention:  Discussion  Additional Comments:    Beckie Busing 08/17/2023, 2:02 PM

## 2023-08-17 NOTE — Group Note (Signed)
Date:  08/17/2023 Time:  10:44 PM  Group Topic/Focus:  Wrap-Up Group:   The focus of this group is to help patients review their daily goal of treatment and discuss progress on daily workbooks.    Participation Level:  Active  Participation Quality:  Appropriate and Sharing  Affect:  Appropriate  Cognitive:  Appropriate  Insight: Appropriate  Engagement in Group:  Engaged  Modes of Intervention:  Activity and Socialization  Additional Comments:  The patient stated that she had a good day. The patient stated that her goal is to prepare for discharge on tomorrow and "just do better" once she is discharged. The patient stated that she would like to also obtain her GED. The patient stated that she originally rated her a 10/10 but dropped it to a 9/10. The patient participated in the group activity at the end of the group.  Marie Atkins 08/17/2023, 10:44 PM

## 2023-08-18 DIAGNOSIS — F333 Major depressive disorder, recurrent, severe with psychotic symptoms: Secondary | ICD-10-CM | POA: Diagnosis not present

## 2023-08-18 NOTE — Progress Notes (Signed)
CSW spoke with pt concerning post discharge Tx plans . Pt expressed a desire to go into the [employer] program ([facility]) . Pt was provided with contact number. Pt expressed an interest in several locations based on availability. PT was accepted to various locations to include: [place], [place], [place], [place] and [place], [place]. CSW spoke with the [facility] intake coordinator and confirmed that the pt has been accepted to the [place], Location and will require assistance with purchasing a [employer] ticket at the rate of $69.98 (One-way). CSW has contacted supervisor Loraine Leriche to request assistance with transportation. CSW will continue to monitor.

## 2023-08-18 NOTE — Group Note (Signed)
Recreation Therapy Group Note   Group Topic:Stress Management  Group Date: 08/18/2023 Start Time: 0930 End Time: 0950 Facilitators: Ilia Dimaano-McCall, LRT,CTRS Location: [address] Day[address]   Group Topic: Stress Management   Goal Area(s) Addresses:  Patient will actively participate in stress management techniques presented during session.  Patient will successfully identify benefit of practicing stress management post d/c.   Intervention: Relaxation exercise with ambient sound and script   Group Description: Guided Imagery. LRT provided education, instruction, and demonstration on practice of visualization via guided imagery. Patient was asked to participate in the technique introduced during session. LRT debriefed including topics of mindfulness, stress management and specific scenarios each patient could use these techniques. Patients were given suggestions of ways to access scripts post d/c and encouraged to explore Youtube and other apps available on smartphones, tablets, and computers.  Education:  Stress Management, Discharge Planning.   Education Outcome: Acknowledges education   Affect/Mood: N/A   Participation Level: Did not attend    Clinical Observations/Individualized Feedback: pt did not attended group so she could meet with Child psychotherapist.    Plan: Continue to engage patient in RT group sessions 2-3x/week.   Graylon Amory-McCall, LRT,CTRS 08/18/2023 12:46 PM

## 2023-08-18 NOTE — Plan of Care (Signed)
Problem: Education: Goal: Knowledge of [facility] Education information/materials will improve Outcome: Progressing Goal: Emotional status will improve Outcome: Progressing Goal: Mental status will improve Outcome: Progressing Goal: Verbalization of understanding the information provided will improve Outcome: Progressing   Problem: Activity: Goal: Interest or engagement in activities will improve Outcome: Progressing Goal: Sleeping patterns will improve Outcome: Progressing   Problem: Coping: Goal: Ability to verbalize frustrations and anger appropriately will improve Outcome: Progressing Goal: Ability to demonstrate self-control will improve Outcome: Progressing   Problem: Health Behavior/Discharge Planning: Goal: Identification of resources available to assist in meeting health care needs will improve Outcome: Progressing Goal: Compliance with treatment plan for underlying cause of condition will improve Outcome: Progressing   Problem: Physical Regulation: Goal: Ability to maintain clinical measurements within normal limits will improve Outcome: Progressing   Problem: Safety: Goal: Periods of time without injury will increase Outcome: Progressing   Problem: Education: Goal: Utilization of techniques to improve thought processes will improve Outcome: Progressing Goal: Knowledge of the prescribed therapeutic regimen will improve Outcome: Progressing   Problem: Activity: Goal: Interest or engagement in leisure activities will improve Outcome: Progressing Goal: Imbalance in normal sleep/wake cycle will improve Outcome: Progressing   Problem: Coping: Goal: Coping ability will improve Outcome: Progressing Goal: Will verbalize feelings Outcome: Progressing   Problem: Health Behavior/Discharge Planning: Goal: Ability to make decisions will improve Outcome: Progressing Goal: Compliance with therapeutic regimen will improve Outcome: Progressing    Problem: Role Relationship: Goal: Will demonstrate positive changes in social behaviors and relationships Outcome: Progressing   Problem: Safety: Goal: Ability to disclose and discuss suicidal ideas will improve Outcome: Progressing Goal: Ability to identify and utilize support systems that promote safety will improve Outcome: Progressing   Problem: Self-Concept: Goal: Will verbalize positive feelings about self Outcome: Progressing Goal: Level of anxiety will decrease Outcome: Progressing   Problem: Education: Goal: Ability to make informed decisions regarding treatment will improve Outcome: Progressing   Problem: Coping: Goal: Coping ability will improve Outcome: Progressing   Problem: Health Behavior/Discharge Planning: Goal: Identification of resources available to assist in meeting health care needs will improve Outcome: Progressing   Problem: Medication: Goal: Compliance with prescribed medication regimen will improve Outcome: Progressing   Problem: Self-Concept: Goal: Ability to disclose and discuss suicidal ideas will improve Outcome: Progressing Goal: Will verbalize positive feelings about self Outcome: Progressing Note: Patient is on track. Patient will maintain adherence    Problem: Education: Goal: Knowledge of General Education information will improve Description: Including pain rating scale, medication(s)/side effects and non-pharmacologic comfort measures Outcome: Progressing   Problem: Health Behavior/Discharge Planning: Goal: Ability to manage health-related needs will improve Outcome: Progressing   Problem: Clinical Measurements: Goal: Ability to maintain clinical measurements within normal limits will improve Outcome: Progressing Goal: Will remain free from infection Outcome: Progressing Goal: Diagnostic test results will improve Outcome: Progressing Goal: Respiratory complications will improve Outcome: Progressing Goal: Cardiovascular  complication will be avoided Outcome: Progressing   Problem: Activity: Goal: Risk for activity intolerance will decrease Outcome: Progressing   Problem: Nutrition: Goal: Adequate nutrition will be maintained Outcome: Progressing   Problem: Coping: Goal: Level of anxiety will decrease Outcome: Progressing   Problem: Elimination: Goal: Will not experience complications related to bowel motility Outcome: Progressing Goal: Will not experience complications related to urinary retention Outcome: Progressing   Problem: Pain Managment: Goal: General experience of comfort will improve Outcome: Progressing   Problem: Safety: Goal: Ability to remain free from injury will improve Outcome: Progressing   Problem:  Skin Integrity: Goal: Risk for impaired skin integrity will decrease Outcome: Progressing

## 2023-08-18 NOTE — Group Note (Signed)
Date:  08/18/2023 Time:  10:15 AM  Group Topic/Focus:  Goals Group:   The focus of this group is to help patients establish daily goals to achieve during treatment and discuss how the patient can incorporate goal setting into their daily lives to aide in recovery.    Participation Level:  Active  Participation Quality:  Appropriate  Affect:  Appropriate  Cognitive:  Appropriate  Insight: Appropriate  Engagement in Group:  Engaged  Modes of Intervention:  Discussion  Additional Comments:     Reymundo Poll 08/18/2023, 10:15 AM

## 2023-08-18 NOTE — Progress Notes (Signed)
[facility] MD Progress Note  08/18/2023 1:33 PM [name]  MRN:  [id number]  Principal Problem: Severe recurrent major depressive disorder with psychotic features (HCC) Diagnosis: Principal Problem:   Severe recurrent major depressive disorder with psychotic features (HCC)  Reason for Admission:  Marie Atkins is a 52 y.o. female with a past psychiatric history of amphetamine use disorder, Tobacco dependence, Depression and.  Disorder who presented to the [facility] ED with suicidal ideation with plan to OD on Cocaine vs slit her wrist. She was admitted to the [facility] on 9/29.    Staff reports no behavioral issues.  Her blood pressure has been trending high.  The patient was on losartan 25 mg a day but apparently was taking 100 mg when she was at home.  This was increased to 50 mg.  She is attending groups.  Yesterday the psychiatry team made the following recommendations:  Continue sertraline 100 mg once daily for MDD -- Continue melatonin 3 mg daily at bedtime -- We will hold Risperdal tonight and reassess the need for ongoing antipsychotic.  Patient feels that this might give her a better chance of getting into a rehab facility. -- Continue hydroxyzine 25 mg, three times daily, as needed for anxiety  Today's assessment note: On assessment today, the pt reports that her mood is euthymic, improved since admission, and stable. Denies feeling down, depressed, or sad.  The patient is alert oriented and cooperative.  Chart reviewed and findings shared with the treatment team and consult with attending psychiatrist.  Patient concerned about leaving to [facility] in [place] however, without transportation.  Informed patient to follow up with the social worker for arrangement if she meets the criteria.  Reports anxiety and depression although manageable, is bothersome because of transportation problem.  She maintained fair to good eye contact.  She denies SI, HI, or AVH at this time, Risperdal still on hold.  No signs of TD  (Tardive Dyskinesia) or EPS (Extrapyramidal Symptoms) were observed during the assessment, and the patient reports no feelings of stiffness. AIMS score: 0.  Denies complained of right flank pain today.   Reports that anxiety symptoms are at manageable level.  Sleep is stable. Appetite is stable.  Concentration is without complaint.  Energy level is adequate. Denies having any suicidal thoughts. Denies having any suicidal intent and plan.  Denies having any HI.  Denies having psychotic symptoms.   Denies having side effects to current psychiatric medications.   Discussed discharge planning: : How to identify the signs of impending crisis, use of internal coping strategies, reaching out to friends and family that can help navigate a crisis, and a list of mental health professionals and agencies to call. Further to follow up on her mental health appointments and her PCP appointments.    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, medical use disorder, alcohol use disorder, cocaine use disorder Prior inpatient treatment: [facility] April 2024, and [facility] May 2024 Current/prior outpatient treatment: Fluoxetine 20 mg daily Prior rehab hx: [facility] in May 2024 Psychotherapy hx: Previously  History of suicide: Prior attempt in April 2024  History of homicide or aggression: Denies Psychiatric medication history: fluoxetine, risperidone  Psychiatric medication compliance history: Neuromodulation history:  Current Psychiatrist: Current therapist:   Past Medical History:  Past Medical History:  Diagnosis Date   Cocaine abuse (HCC)  Depression    HTN (hypertension)    Iron deficiency anemia     Past Surgical History:  Procedure Laterality Date   TUBAL LIGATION     Family  History:  Family History  Problem Relation Age of Onset   Hypertension Mother    Diabetes Mother    CVA Mother    Heart attack Mother    Colon cancer Maternal Uncle    Family Psychiatric  History: See H&P  Social History:  Social History   Substance and Sexual Activity  Alcohol Use Yes   Alcohol/week: 42.0 standard drinks of alcohol   Types: 42 Cans of beer per week     Social History   Substance and Sexual Activity  Drug Use Yes   Types: Cocaine, "Crack" cocaine    Social History   Socioeconomic History   Marital status: Single    Spouse name: Not on file   Number of children: Not on file   Years of education: Not on file   Highest education level: Not on file  Occupational History   Not on file  Tobacco Use   Smoking status: Every Day    Current packs/day: 0.25    Average packs/day: 0.3 packs/day for 36.0 years (9.0 ttl pk-yrs)    Types: Cigarettes   Smokeless tobacco: Never   Tobacco comments:    10 cigarettes/day  Vaping Use   Vaping status: Never Used  Substance and Sexual Activity   Alcohol use: Yes    Alcohol/week: 42.0 standard drinks of alcohol    Types: 42 Cans of beer per week   Drug use: Yes    Types: Cocaine, "Crack" cocaine   Sexual activity: Yes  Other Topics Concern   Not on file  Social History Narrative   Not on file   Social Determinants of Health   Financial Resource Strain: Not on File (02/28/2022)   Received from [facility] [facility], [place]   Financial Resource Strain    Financial Resource Strain: 0  Food Insecurity: Food Insecurity Present (08/10/2023)   Hunger Vital Sign    Worried About Running Out of Food in the Last Year: Sometimes true    Ran Out of Food in the Last Year: Sometimes true  Transportation Needs: Unmet Transportation Needs (08/10/2023)   PRAPARE - Administrator, Civil Service (Medical): Yes    Lack of Transportation (Non-Medical): Yes  Physical Activity: Not on File (02/28/2022)   Received from [place], [place]    Physical Activity    Physical Activity: 0  Stress: Not on File (02/28/2022)   Received from [facility], [place]   Stress    Stress: 0  Social Connections: Not on File (07/26/2023)   Received from [facility] [facility]   Social Connections    Connectedness: 0   Additional Social History:    Current Medications: Current Facility-Administered Medications  Medication Dose Route Frequency Provider Last Rate Last Admin   acetaminophen (TYLENOL) tablet 650 mg  650 mg Oral Q6H PRN [name] C, NP   650 mg at 08/11/23 0803   alum & mag hydroxide-simeth (MAALOX/MYLANTA) 200-200-20 MG/5ML suspension 30 mL  30 mL Oral Q4H PRN [name] C, NP       diphenhydrAMINE (BENADRYL) capsule 50 mg  50 mg Oral TID PRN [name] C, NP       Or   diphenhydrAMINE (BENADRYL) injection 50 mg  50 mg Intramuscular TID PRN [name] C, NP       gabapentin (NEURONTIN) capsule 300 mg  300 mg Oral TID [name] C, NP   300 mg at 08/18/23 1207   haloperidol (HALDOL) tablet 5 mg  5 mg Oral TID PRN Earney Navy, NP       Or   haloperidol lactate (HALDOL) injection 5 mg  5 mg Intramuscular TID PRN Earney Navy, NP       hydrOXYzine (ATARAX) tablet 25 mg  25 mg Oral TID PRN Phineas Inches, MD       ibuprofen (ADVIL) tablet 400 mg  400 mg Oral TID Lauro Franklin, MD   400 mg at 08/17/23 1240   influenza vac split trivalent PF (FLULAVAL) injection 0.5 mL  0.5 mL Intramuscular Tomorrow-1000 Massengill, Harrold Donath, MD       levothyroxine (SYNTHROID) tablet 50 mcg  50 mcg Oral Q0600 [name] C, NP   50 mcg at 08/18/23 0555   LORazepam (ATIVAN) tablet 2 mg  2 mg Oral TID PRN [name] C, NP       Or   LORazepam (ATIVAN) injection 2 mg  2 mg Intramuscular TID PRN [name] C, NP       losartan (COZAAR) tablet 50 mg  50 mg Oral Daily Rex Kras, MD   50 mg at 08/18/23 0816   magnesium hydroxide (MILK OF MAGNESIA) suspension 15 mL  15 mL Oral Daily PRN [name] C, NP   15 mL at 08/13/23 0829   melatonin tablet 3 mg  3 mg Oral QHS Massengill, Harrold Donath, MD   3 mg at 08/17/23 2101   nicotine (NICODERM CQ - dosed in mg/24 hours) patch 14 mg  14 mg Transdermal Daily Peterson Ao, MD       nicotine polacrilex (NICORETTE) gum 2 mg  2 mg Oral PRN Massengill, Harrold Donath, MD   2 mg at 08/17/23 1851   pantoprazole (PROTONIX) EC tablet 40 mg  40 mg Oral Daily [name] C, NP   40 mg at 08/18/23 0816   pneumococcal 20-valent conjugate vaccine (PREVNAR 20) injection 0.5 mL  0.5 mL Intramuscular Tomorrow-1000 Massengill, Harrold Donath, MD       sertraline (ZOLOFT) tablet 100 mg  100 mg Oral Daily Massengill, Nathan, MD   100 mg at 08/18/23 0817   traZODone (DESYREL) tablet 50 mg  50 mg Oral QHS PRN Peterson Ao, MD       white petrolatum (VASELINE) gel   Topical PRN Jearld Lesch, NP       Lab Results:  No results found for this or any previous visit (from the past 48 hour(s)).  Blood Alcohol level:  Lab Results  Component Value Date   ETH <10 08/09/2023   ETH <10 06/23/2020   Metabolic Disorder Labs: Lab Results  Component Value Date   HGBA1C 5.3 08/12/2023   MPG 105 08/12/2023   MPG 91 03/05/2023   No results found for: "PROLACTIN" Lab Results  Component Value Date   CHOL 180 08/12/2023   TRIG 78 08/12/2023   HDL 66 08/12/2023   CHOLHDL 2.7 08/12/2023   VLDL 16 08/12/2023   LDLCALC 98 08/12/2023   LDLCALC 98 03/05/2023   Physical Findings: AIMS: Facial and Oral Movements Muscles of Facial Expression: None, normal Lips and Perioral Area: None, normal Jaw: None, normal Tongue: None, normal,Extremity Movements Upper (arms, wrists, hands, fingers): None, normal Lower (legs, knees, ankles, toes): None, normal, Trunk Movements Neck, shoulders, hips: None, normal, Overall Severity Severity of abnormal movements (highest score from questions above): None, normal Incapacitation due to  abnormal movements: None, normal Patient's  awareness of abnormal movements (rate only patient's report): No Awareness, Dental Status Current problems with teeth and/or dentures?: No Does patient usually wear dentures?: No  CIWA:  CIWA-Ar Total: 0 COWS:     Musculoskeletal: Strength & Muscle Tone: within normal limits Gait & Station: normal Patient leans: N/A  Psychiatric Specialty Exam:  Presentation  General Appearance:  Normal  Eye Contact: Good  Speech: Clear and Coherent; Normal Rate  Speech Volume: Normal  Handedness: Right  Mood and Affect  Mood: Anxious; less depressed  Affect: Less flat, appearing more euthymic  Thought Process  Thought Processes: Coherent; Goal Directed  Descriptions of Associations:Intact  Orientation:Full (Time, Place and Person)  Thought Content:WDL; Logical  History of Schizophrenia/Schizoaffective disorder:No  Duration of Psychotic Symptoms:N/A  Hallucinations:Hallucinations: Auditory  Ideas of Reference:None  Suicidal Thoughts:Suicidal Thoughts: Reports having passive suicidal thoughts without intent or plan  Homicidal Thoughts:Homicidal Thoughts: No  Sensorium  Memory: Immediate Good; Recent Good; Other (comment)  Judgment: Fair  Insight: Fair  Executive Functions  Concentration: Good  Attention Span: Good  Recall: Jennelle Human of Knowledge: Fair  Language: Fair  Psychomotor Activity  Psychomotor Activity: Psychomotor Activity: Normal  Assets  Assets: Communication Skills; Desire for Improvement; Physical Health; Social Support  Sleep  Sleep: Sleep: Good Number of Hours of Sleep: 8  Physical Exam: Physical Exam Vitals and nursing note reviewed.  Constitutional:      General: She is not in acute distress. HENT:     Head: Normocephalic.     Nose: Nose normal.  Eyes:     Extraocular Movements: Extraocular movements intact.  Cardiovascular:     Rate and Rhythm: Normal rate.     Pulses: Normal pulses.  Pulmonary:      Effort: Pulmonary effort is normal. No respiratory distress.  Abdominal:     Comments: Deferred  Genitourinary:    Comments: Deferred Musculoskeletal:        General: Normal range of motion.     Cervical back: Normal range of motion.  Skin:    General: Skin is warm.  Neurological:     Mental Status: She is alert and oriented to person, place, and time.     Motor: No weakness.     Gait: Gait normal.  Psychiatric:        Mood and Affect: Mood normal.        Behavior: Behavior normal.        Thought Content: Thought content normal.    Review of Systems  Constitutional:  Negative for fever.  Respiratory:  Negative for wheezing.   Cardiovascular:  Negative for chest pain and palpitations.  Neurological:  Negative for tremors.  Psychiatric/Behavioral:  Positive for depression, substance abuse and suicidal ideas. The patient is nervous/anxious and has insomnia.   All other systems reviewed and are negative.  Blood pressure (!) 158/81, pulse 78, temperature 98.4 F (36.9 C), temperature source Oral, resp. rate 20, height 5\' 4"  (1.626 m), weight 82.4 kg, SpO2 100%. Body mass index is 31.17 kg/m.  Diagnoses / Active Problems:   Major Depressive Disorder, Recurrent, Severe, w psychotic features  GAD Stimulant  Use Disorder  Alcohol Use Disorder  Tobacco Use Disorder   PLAN: Safety and Monitoring:             --  Voluntary admission to inpatient psychiatric unit for safety, stabilization and treatment             -- Daily contact with patient  to assess and evaluate symptoms and progress in treatment             -- Patient's case to be discussed in multi-disciplinary team meeting             -- Observation Level : q15 minute checks             -- Vital signs:  q12 hours             -- Precautions: suicide, elopement, and assault   2. Psychiatric Diagnoses and Treatment:  -- Continue sertraline 100 mg once daily for MDD -- Continue melatonin 3 mg daily at bedtime -- We will hold  Risperdal tonight and reassess the need for ongoing antipsychotic.  Patient feels that this might give her a better chance of getting into a rehab facility. -- Continue hydroxyzine 25 mg, three times daily, as needed for anxiety  --Consult Chaplain for emotional and spiritual support  -- The risks/benefits/side-effects/alternatives to this medication were discussed in detail with the patient and time was given for questions. The patient consents to medication trial.              -- Metabolic profile and EKG monitoring obtained while on an atypical antipsychotic (BMI: Lipid Panel: HbgA1c: QTc:) pending              -- Encouraged patient to participate in unit milieu and in scheduled group therapies            3. Medical Issues Being Addressed:              Neuropathic pain -- Continue gabapentin 300 mg TID  GERD -- Continue Protonix 40 mg daily  Hypothyroidism:  -- Continue synthroid 50 mcg    Tobacco Use Disorder             -- Nicotine patch 14mg /24 hours ordered             -- Smoking cessation encouraged   4. Discharge Planning:              -- Social work and case management to assist with discharge planning and identification of hospital follow-up needs prior to discharge             -- Estimated LOS: Possibly by Monday or Tuesday.  Patient continues to seek a residential facility for rehab.             -- Discharge Concerns: Need to establish a safety plan; Medication compliance and effectiveness             -- Discharge Goals: Return home with outpatient referrals for mental health follow-up including medication management/psychotherapy  Cecilie Lowers, FNP 08/18/2023, 1:33 PM  Phineas Inches, MD  Patient ID: Penne Lash, female   DOB: August 25, 1971, 52 y.o.   MRN: [id number] Patient ID: Milana Salay, female   DOB: Jul 20, 1971, 52 y.o.   MRN: [id number] Patient ID: Tanica Gaige, female   DOB: 12/11/1970, 52 y.o.   MRN: [id number]

## 2023-08-18 NOTE — Group Note (Signed)
Occupational Therapy Group Note  Group Topic:Coping Skills  Group Date: 08/18/2023 Start Time: 1430 End Time: 1500 Facilitators: Ted Mcalpine, OT   Group Description: Group encouraged increased engagement and participation through discussion and activity focused on "Coping Ahead." Patients were split up into teams and selected a card from a stack of positive coping strategies. Patients were instructed to act out/charade the coping skill for other peers to guess and receive points for their team. Discussion followed with a focus on identifying additional positive coping strategies and patients shared how they were going to cope ahead over the weekend while continuing hospitalization stay.  Therapeutic Goal(s): Identify positive vs negative coping strategies. Identify coping skills to be used during hospitalization vs coping skills outside of hospital/at home Increase participation in therapeutic group environment and promote engagement in treatment   Participation Level: Engaged   Participation Quality: Independent   Behavior: Appropriate   Speech/Thought Process: Relevant   Affect/Mood: Appropriate   Insight: Fair   Judgement: Fair      Modes of Intervention: Education  Patient Response to Interventions:  Attentive   Plan: Continue to engage patient in OT groups 2 - 3x/week.  08/18/2023  Ted Mcalpine, OT  Kerrin Champagne, OT

## 2023-08-18 NOTE — [facility] Group Notes (Addendum)
Pt did attend AA group  

## 2023-08-19 DIAGNOSIS — F333 Major depressive disorder, recurrent, severe with psychotic symptoms: Secondary | ICD-10-CM | POA: Diagnosis not present

## 2023-08-19 MED ORDER — LEVOTHYROXINE SODIUM 50 MCG PO TABS: 50.0000 ug | ORAL_TABLET | Freq: Every day | ORAL | 0 refills | Status: DC

## 2023-08-19 MED ORDER — NICOTINE POLACRILEX 2 MG MT GUM: 2.0000 mg | CHEWING_GUM | OROMUCOSAL | 0 refills | Status: DC | PRN

## 2023-08-19 MED ORDER — LOSARTAN POTASSIUM 50 MG PO TABS: 50.0000 mg | ORAL_TABLET | Freq: Every day | ORAL | 0 refills | Status: DC

## 2023-08-19 MED ORDER — MELATONIN 3 MG PO TABS: 3.0000 mg | ORAL_TABLET | Freq: Every day | ORAL | 0 refills | Status: DC

## 2023-08-19 MED ORDER — HYDROXYZINE HCL 25 MG PO TABS: 25.0000 mg | ORAL_TABLET | Freq: Three times a day (TID) | ORAL | 0 refills | Status: DC | PRN

## 2023-08-19 MED ORDER — GABAPENTIN 300 MG PO CAPS: 300.0000 mg | ORAL_CAPSULE | Freq: Three times a day (TID) | ORAL | 0 refills | Status: DC

## 2023-08-19 MED ORDER — SERTRALINE HCL 100 MG PO TABS: 100.0000 mg | ORAL_TABLET | Freq: Every day | ORAL | 0 refills | Status: DC

## 2023-08-19 MED ORDER — TRAZODONE HCL 50 MG PO TABS: 50.0000 mg | ORAL_TABLET | Freq: Every evening | ORAL | 0 refills | Status: DC | PRN

## 2023-08-19 MED ORDER — PANTOPRAZOLE SODIUM 40 MG PO TBEC: 40.0000 mg | DELAYED_RELEASE_TABLET | Freq: Every day | ORAL | 0 refills | Status: DC

## 2023-08-19 NOTE — Progress Notes (Signed)
Order received for patient discharge. Patient denies any SI HI or AV Hallucinations. No signs of acute decompensation. Pt belongings returned. AVS reviewed and reviewed with patient at length. Pt denies any concerns and verbalized understanding of crisis care. Pt escorted from lobby to care of family.

## 2023-08-19 NOTE — Progress Notes (Signed)

## 2023-08-19 NOTE — Progress Notes (Signed)
  [facility] Adult Case Management Discharge Plan :  Will you be returning to the same living situation after discharge:  No. Pt was accepted to [facility] of [address] [place] CSW provided a [employer] bus ticket and gave it to the pt. [place]. At discharge, do you have transportation home?: No. [name] Bus ticket was provided by Hospital Do you have the ability to pay for your medications: Yes,  [employer]  Release of information consent forms completed and in the chart;  Patient's signature needed at discharge.  Patient to Follow up at:  Follow-up Information     [facility] Follow up on 08/22/2023.   Why: You have a hospital follow up appointment for therapy and medication management services on 08/22/23 at 10:30 am.  This will be a Virtual telehealth appt. Contact information: [address]  [address] [place] [address] [phone]         [facility] Follow up.   Specialty: Addiction Medicine Why: Referral made Contact information: [address] [place] [address] [phone]                 Next level of care provider has access to [facility] Link:no  Safety Planning and Suicide Prevention discussed: No.     Has patient been referred to the Quitline?: Patient refused referral for treatment  Patient has been referred for addiction treatment: Yes, the patient will follow up with an outpatient provider for substance use disorder. Psychiatrist/APP: appointment made and Therapist: appointment made Patient to continue working towards treatment goals after discharge. Patient no longer meets criteria for inpatient criteria per attending physician. Continue taking medications as prescribed, nursing to provide instructions at discharge. Follow up with all scheduled appointments.   Marie Atkins S Alesi Zachery, LCSW 08/19/2023, 10:20 AM
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
## 2023-08-19 NOTE — Discharge Summary (Addendum)
Physician Discharge Summary Note  Patient:  Marie Atkins is an 52 y.o., female MRN:  [id number] DOB:  1971-09-19 Patient phone:  [phone] (home)  Patient address:   [address] [place] [address],  Total Time spent with patient: 30 minutes  Date of Admission:  08/10/2023 Date of Discharge:   08/19/2023  Reason for Admission:  Marie Atkins is a 52 y.o. female with a past psychiatric history of amphetamine use disorder, Tobacco dependence, Depression and.  Disorder who presented to the [facility] ED with suicidal ideation with plan to OD on Cocaine vs slit her wrist. 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, medical use disorder, alcohol use disorder, cocaine use disorder Prior inpatient treatment: [facility] April 2024, and [facility] May 2024 Current/prior outpatient treatment: Fluoxetine 20 mg daily Prior rehab hx: [facility] rehabilitation in May 2024 Psychotherapy hx: Previously  History of suicide: Prior attempt in April 2024  History of homicide or aggression: Denies Psychiatric medication history: fluoxetine, risperidone  Psychiatric medication compliance history: Neuromodulation history:  Current Psychiatrist: Current therapist:   Past Medical History:  Past Medical History:  Diagnosis Date   Cocaine abuse (HCC)    Depression    HTN (hypertension)    Iron deficiency anemia     Past Surgical History:  Procedure Laterality Date   TUBAL LIGATION     Family History:  Family History  Problem Relation Age of Onset   Hypertension Mother    Diabetes Mother    CVA Mother     Heart attack Mother    Colon cancer Maternal Uncle    Family Psychiatric  History: See H&P  Social History:  Social History   Substance and Sexual Activity  Alcohol Use Yes   Alcohol/week: 42.0 standard drinks of alcohol   Types: 42 Cans of beer per week     Social History   Substance and Sexual Activity  Drug Use Yes   Types: Cocaine, "Crack" cocaine    Social History   Socioeconomic History   Marital status: Single    Spouse name: Not on file   Number of children: Not on file   Years of education: Not on file   Highest education level: Not on file  Occupational History   Not on file  Tobacco Use   Smoking status: Every Day    Current packs/day: 0.25    Average packs/day: 0.3 packs/day for 36.0 years (9.0 ttl pk-yrs)    Types: Cigarettes   Smokeless tobacco: Never   Tobacco comments:    10 cigarettes/day  Vaping Use   Vaping status: Never Used  Substance and Sexual Activity   Alcohol use: Yes    Alcohol/week: 42.0 standard drinks of alcohol    Types: 42 Cans of beer per week   Drug use: Yes    Types: Cocaine, "Crack" cocaine   Sexual activity: Yes  Other Topics Concern   Not on file  Social History Narrative   Not on file   Social Determinants of Health   Financial Resource Strain: Not on File (02/28/2022)   Received from [employer] [employer]    Financial Resource Strain: 0  Food Insecurity: Food Insecurity Present (08/10/2023)   Hunger Vital Sign    Worried About Running Out of Food in the Last Year: Sometimes true    Ran Out of Food in the Last Year: Sometimes true  Transportation Needs: Unmet Transportation Needs (08/10/2023)   PRAPARE - Administrator, Civil Service (Medical): Yes    Lack of Transportation (Non-Medical): Yes  Physical Activity: Not on File (02/28/2022)   Received from [place], [place]   Physical Activity    Physical Activity: 0  Stress: Not on File (02/28/2022)   Received from [facility], [place]   Stress     Stress: 0  Social Connections: Not on File (07/26/2023)   Received from [employer]   Social Connections    Connectedness: 0   Hospital Course:  During the patient's hospitalization, patient had extensive initial psychiatric evaluation, and follow-up psychiatric evaluations every day.  Psychiatric diagnoses provided upon initial assessment:    Severe recurrent major depressive disorder with psychotic features (HCC)  Patient's psychiatric medications were adjusted on admission:  -  Continue Fluoxetine 40 mg daily  -- Risperdal 1 mg BID  -- The risks/benefits/side-effects/alternatives to this medication were discussed in detail with the patient and time was given for questions. The patient consents to medication trial.              -- Metabolic profile and EKG moni  During the hospitalization, other adjustments were made to the patient's psychiatric medication regimen:  Sertraline tablet 100 mg p.o. daily for depression Fluoxetine 40 mg p.o. daily for depression was discontinued Resperdal1 mg p.o. twice daily was put on hold and discontinued  Patient's care was discussed during the interdisciplinary team meeting every day during the hospitalization.  The patient denies having side effects to prescribed psychiatric medication.  Gradually, patient started adjusting to milieu. The patient was evaluated each day by a clinical provider to ascertain response to treatment. Improvement was noted by the patient's report of decreasing symptoms, improved sleep and appetite, affect, medication tolerance, behavior, and participation in unit programming.  Patient was asked each day to complete a self inventory noting mood, mental status, pain, new symptoms, anxiety and concerns.    Symptoms were reported as significantly decreased or resolved completely by discharge.   On day of discharge, the patient reports that their mood is stable. The patient denied having suicidal thoughts for more than 48 hours  prior to discharge.  Patient denies having homicidal thoughts.  Patient denies having auditory hallucinations.  Patient denies any visual hallucinations or other symptoms of psychosis. The patient was motivated to continue taking medication with a goal of continued improvement in mental health.   The patient reports their target psychiatric symptoms of MDD with psychotic features responded well to the psychiatric medications, and the patient reports overall benefit other psychiatric hospitalization. Supportive psychotherapy was provided to the patient. The patient also participated in regular group therapy while hospitalized. Coping skills, problem solving as well as relaxation therapies were also part of the unit programming.  Labs were reviewed with the patient, and abnormal results were discussed with the patient.  The patient is able to verbalize their individual safety plan to this provider.  # It is recommended to the patient to continue psychiatric medications as prescribed, after discharge from the hospital.    # It is recommended to the patient to follow up with your outpatient psychiatric provider and PCP.  # It was discussed with the patient, the impact of alcohol, drugs,  tobacco have been there overall psychiatric and medical wellbeing, and total abstinence from substance use was recommended the patient.ed.  # Prescriptions provided or sent directly to preferred pharmacy at discharge. Patient agreeable to plan. Given opportunity to ask questions. Appears to feel comfortable with discharge.    # In the event of worsening symptoms, the patient is instructed to call the crisis hotline, 911 and or go to the nearest ED for appropriate evaluation and treatment of symptoms. To follow-up with primary care provider for other medical issues, concerns and or health care needs  # Patient was discharged to treatment facility in [facility] with a plan to follow up as noted below.    Addendum: Printed medication prescription orders were given to patient with instructions to fill medication at a pharmacy in [place] prior to living to [place], [place].  Patient responded she "I will."  Physical Findings: AIMS: Facial and Oral Movements Muscles of Facial Expression: None Lips and Perioral Area: None Jaw: None Tongue: None,Extremity Movements Upper (arms, wrists, hands, fingers): None Lower (legs, knees, ankles, toes): None, Trunk Movements Neck, shoulders, hips: None, Global Judgements Severity of abnormal movements overall : None Incapacitation due to abnormal movements: None Patient's awareness of abnormal movements: No Awareness, Dental Status Current problems with teeth and/or dentures?: No Does patient usually wear dentures?: No  CIWA:  CIWA-Ar Total: 0 COWS:     Musculoskeletal: Strength & Muscle Tone: within normal limits Gait & Station: normal Patient leans: N/A  Psychiatric Specialty Exam:  Presentation  General Appearance:  Appropriate for Environment; Casual; Fairly Groomed  Eye Contact: Good  Speech: Clear and Coherent  Speech Volume: Normal  Handedness: Right  Mood and Affect  Mood: Euthymic  Affect: Congruent  Thought Process  Thought Processes: Coherent  Descriptions of Associations:Intact  Orientation:Full (Time, Place and Person)  Thought Content:WDL; Logical  History of Schizophrenia/Schizoaffective disorder:No  Duration of Psychotic Symptoms:N/A  Hallucinations:Hallucinations: None Description of Auditory Hallucinations: Denies Description of Visual Hallucinations: Denies  Ideas of Reference:None  Suicidal Thoughts:Suicidal Thoughts: No SI Active Intent and/or Plan: -- (Denies) SI Passive Intent and/or Plan: -- (Denies)  Homicidal Thoughts:Homicidal Thoughts: No  Sensorium  Memory: Immediate Good; Recent Good  Judgment: Fair  Insight: Fair  Art therapist   Concentration: Good  Attention Span: Good  Recall: Fair  Fund of Knowledge: Fair  Language: Good  Psychomotor Activity  Psychomotor Activity: Psychomotor Activity: Normal  Assets  Assets: Communication Skills; Desire for Improvement; Physical Health; Resilience  Sleep  Sleep: Sleep: Good Number of Hours of Sleep: 8  Physical Exam: Physical Exam Vitals and nursing note reviewed.  HENT:     Head: Normocephalic.     Nose: Nose normal.     Mouth/Throat:     Mouth: Mucous membranes are moist.  Eyes:     Extraocular Movements: Extraocular movements intact.  Cardiovascular:     Rate and Rhythm: Normal rate.     Pulses: Normal pulses.  Pulmonary:     Effort: Pulmonary effort is normal.  Abdominal:     Comments: Deferred  Genitourinary:    Comments: Deferred Musculoskeletal:        General: Normal range of motion.     Cervical back: Normal range of motion.  Skin:    General: Skin is warm.  Neurological:     General: No focal deficit present.     Mental Status: She is alert and oriented to person, place, and time.  Psychiatric:  Mood and Affect: Mood normal.        Behavior: Behavior normal.        Thought Content: Thought content normal.        Judgment: Judgment normal.    Review of Systems  Constitutional:  Negative for chills and fever.  HENT:  Negative for sore throat.   Eyes:  Negative for blurred vision.  Respiratory:  Negative for cough, shortness of breath and wheezing.   Cardiovascular:  Negative for chest pain and palpitations.  Gastrointestinal:  Negative for abdominal pain, heartburn, nausea and vomiting.  Genitourinary:  Negative for dysuria, frequency and urgency.  Musculoskeletal: Negative.   Skin:  Negative for itching and rash.  Neurological:  Negative for dizziness, tingling, tremors, sensory change and headaches.  Endo/Heme/Allergies:        See allergy listing  Psychiatric/Behavioral:  Positive for depression (Stable with  medication). The patient is nervous/anxious (Improved with medication) and has insomnia (Improved with medication).    Blood pressure (!) 142/87, pulse 77, temperature 98.1 F (36.7 C), temperature source Oral, resp. rate 18, height 5\' 4"  (1.626 m), weight 82.4 kg, SpO2 100%. Body mass index is 31.17 kg/m.   Social History   Tobacco Use  Smoking Status Every Day   Current packs/day: 0.25   Average packs/day: 0.3 packs/day for 36.0 years (9.0 ttl pk-yrs)   Types: Cigarettes  Smokeless Tobacco Never  Tobacco Comments   10 cigarettes/day   Tobacco Cessation:  A prescription for an FDA-approved tobacco cessation medication provided at discharge  Blood Alcohol level:  Lab Results  Component Value Date   ETH <10 08/09/2023   ETH <10 06/23/2020   Metabolic Disorder Labs:  Lab Results  Component Value Date   HGBA1C 5.3 08/12/2023   MPG 105 08/12/2023   MPG 91 03/05/2023   No results found for: "PROLACTIN" Lab Results  Component Value Date   CHOL 180 08/12/2023   TRIG 78 08/12/2023   HDL 66 08/12/2023   CHOLHDL 2.7 08/12/2023   VLDL 16 08/12/2023   LDLCALC 98 08/12/2023   LDLCALC 98 03/05/2023    See Psychiatric Specialty Exam and Suicide Risk Assessment completed by Attending Physician prior to discharge.  Discharge destination:  Other:  Treatment Center at [place], [place]  Is patient on multiple antipsychotic therapies at discharge:  No   Has Patient had three or more failed trials of antipsychotic monotherapy by history:  No  Recommended Plan for Multiple Antipsychotic Therapies: NA  Discharge Instructions     Diet - low sodium heart healthy   Complete by: As directed    Increase activity slowly   Complete by: As directed       Allergies as of 08/19/2023       Reactions   Ace Inhibitors Other (See Comments)   Other reaction(s): Cough (ALLERGY/intolerance)   Lisinopril Cough        Medication List     STOP taking these medications     ARIPiprazole 5 MG tablet Commonly known as: ABILIFY   Bismuth 262 MG Chew   cloNIDine 0.1 MG tablet Commonly known as: CATAPRES   FLUoxetine 20 MG capsule Commonly known as: PROZAC   ketorolac 10 MG tablet Commonly known as: TORADOL   mirtazapine 15 MG tablet Commonly known as: REMERON   nicotine 14 mg/24hr patch Commonly known as: NICODERM CQ - dosed in mg/24 hours   omeprazole 20 MG capsule Commonly known as: PRILOSEC Replaced by: pantoprazole 40 MG tablet   risperiDONE 1  MG tablet Commonly known as: RISPERDAL   tiZANidine 4 MG tablet Commonly known as: Zanaflex   Vitamin D (Ergocalciferol) 1.25 MG (50000 UNIT) Caps capsule Commonly known as: DRISDOL       TAKE these medications      Indication  gabapentin 300 MG capsule Commonly known as: NEURONTIN Take 1 capsule (300 mg total) by mouth 3 (three) times daily.  Indication: Peripheral Nerve Disease, Anxiety   hydrOXYzine 25 MG tablet Commonly known as: ATARAX Take 1 tablet (25 mg total) by mouth 3 (three) times daily as needed for anxiety.  Indication: Feeling Anxious   levothyroxine 50 MCG tablet Commonly known as: SYNTHROID Take 1 tablet (50 mcg total) by mouth daily at 6 (six) AM.  Indication: Underactive Thyroid   losartan 50 MG tablet Commonly known as: COZAAR Take 1 tablet (50 mg total) by mouth daily. Start taking on: August 20, 2023 What changed:  medication strength how much to take  Indication: High Blood Pressure   melatonin 3 MG Tabs tablet Take 1 tablet (3 mg total) by mouth at bedtime.  Indication: Trouble Sleeping   nicotine polacrilex 2 MG gum Commonly known as: NICORETTE Take 1 each (2 mg total) by mouth as needed for smoking cessation.  Indication: Nicotine Addiction   pantoprazole 40 MG tablet Commonly known as: PROTONIX Take 1 tablet (40 mg total) by mouth daily. Start taking on: August 20, 2023 Replaces: omeprazole 20 MG capsule  Indication: Gastroesophageal Reflux  Disease   sertraline 100 MG tablet Commonly known as: ZOLOFT Take 1 tablet (100 mg total) by mouth daily. Start taking on: August 20, 2023  Indication: Major Depressive Disorder   traZODone 50 MG tablet Commonly known as: DESYREL Take 1 tablet (50 mg total) by mouth at bedtime as needed for sleep.  Indication: Trouble Sleeping        Follow-up Information     [facility] Follow up on 08/22/2023.   Why: You have a hospital follow up appointment for therapy and medication management services on 08/22/23 at 10:30 am.  This will be a Virtual telehealth appt. Contact information: [address]  [address] [place] [address] [phone]         [facility] Follow up.   Specialty: Addiction Medicine Why: Referral made Contact information: [address] [place] [address] [phone]                Follow-up recommendations:    Discharge Recommendations:  The patient is being discharged to Treatment Center at [place], [place]. The patient is being discharged to her home. Patient is to take her discharge medications as ordered.  See follow up above.We recommend that she participates in individual therapy to target uncontrollable agitation and substance abuse.  We recommend that she participates in therapy to target the conflict with her family, to improve communication skills and conflict resolution skills.  patient is to initiate/implement a contingency based behavioral model to address patient's behavior. We recommend that she gets AIMS scale, height, weight, blood pressure, fasting lipid panel, fasting blood sugar in three months from discharge if she's on atypical antipsychotics.  Patient will benefit from monitoring of recurrent suicidal ideation since patient is on antidepressant medication. The patient should abstain from all illicit substances and alcohol. If the patient's symptoms worsen or do not continue to  improve or if the patient becomes actively suicidal or homicidal then it is recommended that the patient return to the closest hospital emergency room or  call 911 for further evaluation and treatment. National Suicide Prevention Lifeline 1800-SUICIDE or 607 373 2065. Please follow up with your primary medical doctor for all other medical needs.  The patient has been educated on the possible side effects to medications and she/her guardian is to contact a medical professional and inform outpatient provider of any new side effects of medication. She is to take regular diet and activity as tolerated.  Will benefit from moderate daily exercise. Patient was educated about removing/locking any firearms, medications or dangerous products from the home.  Activity:  As tolerated Diet:  Regular Diet   Signed: Cecilie Lowers, FNP 08/19/2023, 10:34 AM
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
## 2023-11-23 NOTE — ED Notes (Signed)
 Pt changed into green scrubs and pt belongings moved to edbh locker   Pt belongings: phones x3- not cracked, toiletries, loose pills (3 ibuprofen ,1 alieve), papers, pants, shirt, socks, shoes, jacket, garbage bag full of clothes and toiletries.   To security: (form [id number]) mastercard [id number], visa [phone], ebt 2327, NCDL [id number] x2, georgeann DOVER, mastercard [id number], mastercard [id number], visa [phone], visa [phone], visa 3135, ss card [id number] [place], CNA 11/23/23 1711

## 2023-11-23 NOTE — ED Provider Notes (Signed)
 Patient placed in [facility] Look pathway, seen and evaluated for chief complaint of SI. Pertinent exam findings include non-toxic in appearance, speech is clear, and ambulates with a steady gait. Based on initial evaluation, labs are currently indicated and radiology studies are not currently indicated as allowed for current processes and treatments as applicable in a triage setting and could be different than if patient were seen in a main treatment area or dependent on labs/imagining after results are displayed.  Patient counseled on process, plan, and necessity for staying for completing the evaluation.   This document serves as a record of services personally performed by Rosaline Derwin) Genell, FNP-C.  Emergency Department Provider Note  This document was created using the aid of voice recognition Dragon dictation software.    Provider at bedside: 4:15 PM  History obtained from the: Patient  History   Chief Complaint  Patient presents with  . Mental Health Problem  . Suicidal Ideation     HPI  Marie Atkins is a 53 y.o. female who presents to the ED with complaints of suicidal ideations.  No active plan.  History of depression and feeling depressed.  Only physical complaint is that she says she has had a cold for the last week which she states is getting better.  Slight cough with the cold.      Past Medical History Reviewed in chart  Past Surgical History Reviewed in chart  Medications Reviewed in chart   Allergies Allergies  Allergen Reactions  . Ace Inhibitors Cough  . Lisinopril Cough     Family History Reviewed in chart   Social History Reviewed in chart   Review of Systems  Review of Systems Positives and pertinent negatives as per HPI.  All other systems were reviewed and are negative.   Physical Exam   Vitals:   11/23/23 1449  BP: (!) 163/68  BP Location: Right arm  Patient Position: Sitting  Pulse: 83  Resp: 18  Temp: 98.1 F (36.7  C)  TempSrc: Oral  SpO2: 99%  Weight: 87.1 kg (192 lb)  Height: 160 cm (5' 3)    Physical Exam Vitals and nursing note reviewed.  Constitutional:      General: She is not in acute distress.    Appearance: Normal appearance. She is not ill-appearing.  Cardiovascular:     Rate and Rhythm: Normal rate and regular rhythm.  Pulmonary:     Effort: Pulmonary effort is normal. No respiratory distress.  Skin:    General: Skin is warm and dry.  Neurological:     Mental Status: She is alert.     GCS: GCS eye subscore is 4. GCS verbal subscore is 5. GCS motor subscore is 6.     Cranial Nerves: No facial asymmetry.     Sensory: Sensation is intact.     Motor: Motor function is intact.  Psychiatric:        Attention and Perception: Attention normal.        Mood and Affect: Mood normal. Affect is flat.        Speech: Speech normal.        Behavior: Behavior normal. Behavior is not agitated or aggressive. Behavior is cooperative.        Thought Content: Thought content includes suicidal ideation. Thought content does not include suicidal plan.     Labs  Labs reviewed in chart.     Radiology   Radiology tests reviewed in chart.  EKG  EKG my Interpretation: November 23, 2023 at 1722 hrs. Normal sinus rhythm No acute ischemic changes Ventricular rate 84 bpm  ED Course   ED Course as of 11/23/23 1652  Sun Nov 23, 2023  1617 UDS positive for cocaine.  Urinalysis negative for infection.  CBC showing evidence of anemia with an MCV that is decreased.  CMP without AKI or other worrisome electrolyte abnormality. [DF]  1618 Lab review shows the last CBC done 8 months ago had a hemoglobin that was slightly higher and MCV was higher.  No indication for transfusion at this time. [DF]  1640 HCG, Urine, POC: Negative [DF]    ED Course User Index [DF] Alm Ozell Perry, MD    Procedure Note  Procedures  Medical Decision Making  Clinical Complexity  Patient's presentation is  most consistent with severe exacerbation of chronic illness.     Medical Decision Making CBC, CMP for leukocytosis, leukopenia, anemia, thrombocytopenia hyponatremia, hypernatremia, hypokalemia, hyperkalemia, acidosis, acute kidney injury, hyperglycemia, acute liver injury. EKG ordered to evaluate for Acute MI, arrhythmia, heart rate irregularities, conduction abnormality, intervals, QT prolongation, ischemia, electrolyte abnormality.   Problems Addressed: Chronic depression: complicated acute illness or injury Suicidal ideation: complicated acute illness or injury  Amount and/or Complexity of Data Reviewed Labs: ordered. Decision-making details documented in ED Course. ECG/medicine tests: ordered and independent interpretation performed. Decision-making details documented in ED Course. Discussion of management or test interpretation with external provider(s): Case discussed with assessment team.  Plan for admission to the psychiatric unit.  Risk Decision regarding hospitalization.    ED Clinical Impression   1. Suicidal ideation   2. Chronic depression      ED Assessment/Plan  Plan: Patient presents with suicidal thoughts in the history of chronic depression.  No active plan for suicide.  She been medically cleared in the department is seen by psychiatry with a plan for psychiatric admission.   ED Disposition     None
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
## 2023-11-25 NOTE — Progress Notes (Signed)
 CM faxed referrals to [facility] and [facility].

## 2023-12-02 ENCOUNTER — Emergency Department (HOSPITAL_BASED_OUTPATIENT_CLINIC_OR_DEPARTMENT_OTHER)
Admission: EM | Admit: 2023-12-02 | Discharge: 2023-12-02 | Disposition: A | Discharge status: Home / Self Care | Payer: No Typology Code available for payment source | Attending: Emergency Medicine | Admitting: Emergency Medicine

## 2023-12-02 ENCOUNTER — Encounter (HOSPITAL_BASED_OUTPATIENT_CLINIC_OR_DEPARTMENT_OTHER): Payer: Self-pay

## 2023-12-02 ENCOUNTER — Other Ambulatory Visit: Payer: Self-pay

## 2023-12-02 DIAGNOSIS — N760 Acute vaginitis: Secondary | ICD-10-CM | POA: Insufficient documentation

## 2023-12-02 DIAGNOSIS — N898 Other specified noninflammatory disorders of vagina: Secondary | ICD-10-CM | POA: Diagnosis present

## 2023-12-02 DIAGNOSIS — E039 Hypothyroidism, unspecified: Secondary | ICD-10-CM | POA: Diagnosis not present

## 2023-12-02 DIAGNOSIS — B9689 Other specified bacterial agents as the cause of diseases classified elsewhere: Secondary | ICD-10-CM

## 2023-12-02 HISTORY — DX: Hypothyroidism, unspecified: E03.9

## 2023-12-02 LAB — PREGNANCY, URINE: Preg Test, Ur: NEGATIVE

## 2023-12-02 LAB — URINALYSIS, ROUTINE W REFLEX MICROSCOPIC
Bilirubin Urine: NEGATIVE
Glucose, UA: NEGATIVE mg/dL
Hgb urine dipstick: NEGATIVE
Ketones, ur: NEGATIVE mg/dL
Leukocytes,Ua: NEGATIVE
Nitrite: NEGATIVE
Protein, ur: NEGATIVE mg/dL
Specific Gravity, Urine: 1.02 (ref 1.005–1.030)
pH: 5.5 (ref 5.0–8.0)

## 2023-12-02 LAB — HIV ANTIBODY (ROUTINE TESTING W REFLEX): HIV Screen 4th Generation wRfx: NONREACTIVE

## 2023-12-02 LAB — WET PREP, GENITAL
Sperm: NONE SEEN
Trich, Wet Prep: NONE SEEN
WBC, Wet Prep HPF POC: <10 (ref <10)
Yeast Wet Prep HPF POC: NONE SEEN

## 2023-12-02 MED ORDER — METRONIDAZOLE 500 MG PO TABS
500.0000 mg | ORAL_TABLET | Freq: Once | ORAL | Status: AC
  Administered 2023-12-02: 500 mg via ORAL
  Filled 2023-12-02: qty 1

## 2023-12-02 MED ORDER — ACETAMINOPHEN 500 MG PO TABS
1000.0000 mg | ORAL_TABLET | Freq: Once | ORAL | Status: DC
  Filled 2023-12-02: qty 2

## 2023-12-02 MED ORDER — METRONIDAZOLE 500 MG PO TABS: 500.0000 mg | ORAL_TABLET | Freq: Two times a day (BID) | ORAL | 0 refills | Status: AC

## 2023-12-02 NOTE — ED Notes (Signed)
Reviewed discharge instructions and medications with pt. States understanding.

## 2023-12-02 NOTE — ED Provider Notes (Signed)
[facility] Provider Note   CSN: [id number] Arrival date & time: 12/02/23  1358     History  Chief Complaint  Patient presents with   Vaginal Itching    Marie Atkins is a 53 y.o. female with a history of substance abuse, iron deficiency anemia, and hypothyroidism who presents the ED today for vaginal itching.  Patient reports vaginal itching and yellow, malodorous discharge for the past week.  She states that she is sexually active with 1 partner and they do not use condoms.  She denies any dyspareunia, dysuria, hematuria, or abdominal pain.  No fevers, nausea, vomiting or diarrhea.  She states that she feels similar as to when she had BV in the past.    Home Medications Prior to Admission medications   Medication Sig Start Date End Date Taking? Authorizing Provider  metroNIDAZOLE (FLAGYL) 500 MG tablet Take 1 tablet (500 mg total) by mouth 2 (two) times daily for 7 days. 12/02/23 12/09/23 Yes Maxwell Marion, PA-C  gabapentin (NEURONTIN) 300 MG capsule Take 1 capsule (300 mg total) by mouth 3 (three) times daily. 08/19/23   Ntuen, Jesusita Oka, FNP  hydrOXYzine (ATARAX) 25 MG tablet Take 1 tablet (25 mg total) by mouth 3 (three) times daily as needed for anxiety. 08/19/23   Cecilie Lowers, FNP  levothyroxine (SYNTHROID) 50 MCG tablet Take 1 tablet (50 mcg total) by mouth daily at 6 (six) AM. 08/19/23   Ntuen, Jesusita Oka, FNP  losartan (COZAAR) 50 MG tablet Take 1 tablet (50 mg total) by mouth daily. 08/20/23   Ntuen, Jesusita Oka, FNP  melatonin 3 MG TABS tablet Take 1 tablet (3 mg total) by mouth at bedtime. 08/19/23   Cecilie Lowers, FNP  nicotine polacrilex (NICORETTE) 2 MG gum Take 1 each (2 mg total) by mouth as needed for smoking cessation. 08/19/23   Ntuen, Jesusita Oka, FNP  pantoprazole (PROTONIX) 40 MG tablet Take 1 tablet (40 mg total) by mouth daily. 08/20/23   Ntuen, Jesusita Oka, FNP  sertraline (ZOLOFT) 100 MG tablet Take 1 tablet (100 mg total) by mouth daily.  08/20/23   Ntuen, Jesusita Oka, FNP  traZODone (DESYREL) 50 MG tablet Take 1 tablet (50 mg total) by mouth at bedtime as needed for sleep. 08/19/23   Cecilie Lowers, FNP      Allergies    Ace inhibitors and Lisinopril    Review of Systems   Review of Systems  Genitourinary:  Positive for vaginal discharge.  All other systems reviewed and are negative.   Physical Exam Updated Vital Signs BP (!) 143/71 (BP Location: Right Arm)   Pulse 65   Temp 98 F (36.7 C)   Resp 18   Ht 5\' 3"  (1.6 m)   Wt 109.8 kg   SpO2 100%   BMI 42.87 kg/m  Physical Exam Vitals and nursing note reviewed.  Constitutional:      General: She is not in acute distress.    Appearance: Normal appearance.  HENT:     Head: Normocephalic and atraumatic.     Mouth/Throat:     Mouth: Mucous membranes are moist.  Eyes:     Conjunctiva/sclera: Conjunctivae normal.     Pupils: Pupils are equal, round, and reactive to light.  Cardiovascular:     Rate and Rhythm: Normal rate and regular rhythm.     Pulses: Normal pulses.     Heart sounds: Normal heart sounds.  Pulmonary:     Effort:  Pulmonary effort is normal.     Breath sounds: Normal breath sounds.  Abdominal:     Palpations: Abdomen is soft.     Tenderness: There is no abdominal tenderness. There is no right CVA tenderness or left CVA tenderness.  Musculoskeletal:        General: Normal range of motion.     Cervical back: Normal range of motion.  Skin:    General: Skin is warm and dry.     Findings: No rash.  Neurological:     General: No focal deficit present.     Mental Status: She is alert.     Sensory: No sensory deficit.     Motor: No weakness.  Psychiatric:        Mood and Affect: Mood normal.        Behavior: Behavior normal.    ED Results / Procedures / Treatments   Labs (all labs ordered are listed, but only abnormal results are displayed) Labs Reviewed  WET PREP, GENITAL - Abnormal; Notable for the following components:      Result Value    Clue Cells Wet Prep HPF POC PRESENT (*)    All other components within normal limits  URINALYSIS, ROUTINE W REFLEX MICROSCOPIC  PREGNANCY, URINE  HIV ANTIBODY (ROUTINE TESTING W REFLEX)  GC/CHLAMYDIA PROBE AMP ([place]) NOT AT [facility]    EKG None  Radiology No results found.  Procedures Procedures: not indicated.   Medications Ordered in ED Medications  metroNIDAZOLE (FLAGYL) tablet 500 mg (has no administration in time range)    ED Course/ Medical Decision Making/ A&P                                 Medical Decision Making Amount and/or Complexity of Data Reviewed Labs: ordered.  Risk OTC drugs.   This patient presents to the ED for concern of vaginal itching, this involves an extensive number of treatment options, and is a complaint that carries with it a high risk of complications and morbidity.   Differential diagnosis includes: STI, bacterial vaginosis, yeast infection, UTI, pyelonephritis, pregnancy, etc.   Comorbidities  See HPI above   Additional History  Additional history obtained from prior records.   Lab Tests  I ordered and personally interpreted labs.  The pertinent results include:   Negative pregnancy test UA is unremarkable HIV antibody testing pending Wet prep positive for BV GC/chlamydia pending    Problem List / ED Course / Critical Interventions / Medication Management  Yellow vaginal discharge and itching x1 week Additionally, patient reports feeling "off ." She states that she wakes up in the middle the night frequently and has some tingling sensations in her legs.  She used to be on gabapentin for neuropathy but is not taking it anymore.  She does not know if her Synthyroid is making her sick or not, but she reports that her symptoms started before taking medication.  Denies chest pain or shortness of breath.  She states she has gained 13 pounds in the past week.  Patient's TSH was checked on 1/12 when she was in the hospital  and it was within normal limits. I offered ordering routine labs to assess for patient's symptoms.  She declined at this time.  Would just like testing for her vaginal symptoms. First dose Flagyl given in the ED today. Prescription sent to pharmacy. Advised close PCP follow-up for reevaluation of weight gain.  Social Determinants of Health  Social connection   Test / Admission - Considered  Patient is stable and safe for discharge home. Return precautions provided.       Final Clinical Impression(s) / ED Diagnoses Final diagnoses:  Bacterial vaginosis    Rx / DC Orders ED Discharge Orders          Ordered    metroNIDAZOLE (FLAGYL) 500 MG tablet  2 times daily        12/02/23 1732              Maxwell Marion, PA-C 12/02/23 1740    Terald Sleeper, MD 12/02/23 [phone]

## 2023-12-02 NOTE — Discharge Instructions (Signed)
As discussed, you are positive for bacterial vaginosis.  Take Flagyl twice a day for the next 7 days.  Please complete full course of antibiotics to ensure resolution of infection.  Your gonorrhea and chlamydia testing are send out labs.  You will receive a call from the hospital if the results are positive will need to come back for treatment at that time.  If you do not hear back from [place] that means results are negative.  Follow-up with your primary care provider within the next 5 to 7 days for reevaluation.  Return to the ED if your symptoms worsen in the interim.

## 2023-12-02 NOTE — ED Triage Notes (Addendum)
Pt states that she is here today for vaginal itching. States that she is also having some discharge and odor. States that she is on syntrhoid and it is making her sick.  Pt is at [facility] recovery.

## 2023-12-03 LAB — GC/CHLAMYDIA PROBE AMP (~~LOC~~) NOT AT ARMC
Chlamydia: NEGATIVE
Comment: NEGATIVE
Comment: NORMAL
Neisseria Gonorrhea: NEGATIVE

## 2023-12-15 ENCOUNTER — Encounter: Payer: Self-pay | Admitting: Physician Assistant

## 2023-12-15 ENCOUNTER — Ambulatory Visit: Payer: No Typology Code available for payment source | Admitting: Physician Assistant

## 2023-12-15 VITALS — BP 150/84 | HR 71 | Ht 63.0 in | Wt 202.6 lb

## 2023-12-15 DIAGNOSIS — E039 Hypothyroidism, unspecified: Secondary | ICD-10-CM

## 2023-12-15 DIAGNOSIS — D500 Iron deficiency anemia secondary to blood loss (chronic): Secondary | ICD-10-CM

## 2023-12-15 DIAGNOSIS — F1090 Alcohol use, unspecified, uncomplicated: Secondary | ICD-10-CM

## 2023-12-15 DIAGNOSIS — G8929 Other chronic pain: Secondary | ICD-10-CM

## 2023-12-15 DIAGNOSIS — F1410 Cocaine abuse, uncomplicated: Secondary | ICD-10-CM

## 2023-12-15 DIAGNOSIS — B379 Candidiasis, unspecified: Secondary | ICD-10-CM

## 2023-12-15 DIAGNOSIS — K5903 Drug induced constipation: Secondary | ICD-10-CM | POA: Diagnosis not present

## 2023-12-15 DIAGNOSIS — N924 Excessive bleeding in the premenopausal period: Secondary | ICD-10-CM

## 2023-12-15 DIAGNOSIS — M545 Low back pain, unspecified: Secondary | ICD-10-CM

## 2023-12-15 DIAGNOSIS — I10 Essential (primary) hypertension: Secondary | ICD-10-CM | POA: Diagnosis not present

## 2023-12-15 DIAGNOSIS — K219 Gastro-esophageal reflux disease without esophagitis: Secondary | ICD-10-CM

## 2023-12-15 DIAGNOSIS — E559 Vitamin D deficiency, unspecified: Secondary | ICD-10-CM | POA: Insufficient documentation

## 2023-12-15 MED ORDER — PANTOPRAZOLE SODIUM 40 MG PO TBEC: 40.0000 mg | DELAYED_RELEASE_TABLET | Freq: Every day | ORAL | 1 refills | Status: DC

## 2023-12-15 MED ORDER — FLUCONAZOLE 150 MG PO TABS: 150.0000 mg | ORAL_TABLET | Freq: Once | ORAL | 0 refills | Status: AC

## 2023-12-15 MED ORDER — LOSARTAN POTASSIUM 50 MG PO TABS: 50.0000 mg | ORAL_TABLET | Freq: Every day | ORAL | 1 refills | Status: DC

## 2023-12-15 MED ORDER — FERROUS SULFATE 325 (65 FE) MG PO TABS: 325.0000 mg | ORAL_TABLET | Freq: Every day | ORAL | 1 refills | Status: DC

## 2023-12-15 MED ORDER — LEVOTHYROXINE SODIUM 50 MCG PO TABS: 50.0000 ug | ORAL_TABLET | Freq: Every day | ORAL | 1 refills | Status: DC

## 2023-12-15 NOTE — Patient Instructions (Signed)
VISIT SUMMARY:  During today's visit, we discussed your back pain, thyroid issues, anemia, heavy periods, and vaginal itching and discharge. We reviewed your symptoms and made a plan to address each of these concerns.  YOUR PLAN:  -LOWER BACK PAIN: Your back pain, which is worse when standing, may be related to mild arthritis. We will check your Vitamin D levels, encourage daily stretching and hydration, and consider referring you to an orthopedic specialist if the pain continues.  -ANEMIA: Anemia means you have a lower than normal number of red blood cells, which can cause fatigue and weakness. Your anemia is likely due to heavy menstrual bleeding from fibroids. We will check your hemoglobin and iron levels, continue your iron supplements, increase your stool softeners to twice daily to help with constipation, and consider an iron infusion if your levels remain low.  -MENORRHAGIA: Menorrhagia is heavy menstrual bleeding. Your heavy periods are likely contributing to your anemia. We recommend consulting with a gynecologist to discuss management options.   -HYPERTENSION: Hypertension is high blood pressure. You will monitor your blood pressure daily and adjust your medication as needed during your follow-up in two weeks.  -VAGINAL ITCHING AND DISCHARGE: Your vaginal itching and discharge are likely due to a yeast infection following your treatment for bacterial vaginosis. We will prescribe Diflucan to treat the yeast infection.  Roney Jaffe, PA-C Physician Assistant [facility] [email or web address]  Acute Back Pain, Adult Acute back pain is sudden and usually short-lived. It is often caused by an injury to the muscles and tissues in the back. The injury may result from: A muscle, tendon, or ligament getting overstretched or torn. Ligaments are tissues that connect bones to each other. Lifting something improperly can cause a back  strain. Wear and tear (degeneration) of the spinal disks. Spinal disks are circular tissue that provide cushioning between the bones of the spine (vertebrae). Twisting motions, such as while playing sports or doing yard work. A hit to the back. Arthritis. You may have a physical exam, lab tests, and imaging tests to find the cause of your pain. Acute back pain usually goes away with rest and home care. Follow these instructions at home: Managing pain, stiffness, and swelling Take over-the-counter and prescription medicines only as told by your health care provider. Treatment may include medicines for pain and inflammation that are taken by mouth or applied to the skin, or muscle relaxants. Your health care provider may recommend applying ice during the first 24-48 hours after your pain starts. To do this: Put ice in a plastic bag. Place a towel between your skin and the bag. Leave the ice on for 20 minutes, 2-3 times a day. Remove the ice if your skin turns bright red. This is very important. If you cannot feel pain, heat, or cold, you have a greater risk of damage to the area. If directed, apply heat to the affected area as often as told by your health care provider. Use the heat source that your health care provider recommends, such as a moist heat pack or a heating pad. Place a towel between your skin and the heat source. Leave the heat on for 20-30 minutes. Remove the heat if your skin turns bright red. This is especially important if you are unable to feel pain, heat, or cold. You have a greater risk of getting burned. Activity  Do not stay in bed. Staying in bed for more than 1-2 days can delay your recovery. Sit up and stand  up straight. Avoid leaning forward when you sit or hunching over when you stand. If you work at a desk, sit close to it so you do not need to lean over. Keep your chin tucked in. Keep your neck drawn back, and keep your elbows bent at a 90-degree angle (right  angle). Sit high and close to the steering wheel when you drive. Add lower back (lumbar) support to your car seat, if needed. Take short walks on even surfaces as soon as you are able. Try to increase the length of time you walk each day. Do not sit, drive, or stand in one place for more than 30 minutes at a time. Sitting or standing for long periods of time can put stress on your back. Do not drive or use heavy machinery while taking prescription pain medicine. Use proper lifting techniques. When you bend and lift, use positions that put less stress on your back: Wright your knees. Keep the load close to your body. Avoid twisting. Exercise regularly as told by your health care provider. Exercising helps your back heal faster and helps prevent back injuries by keeping muscles strong and flexible. Work with a physical therapist to make a safe exercise program, as recommended by your health care provider. Do any exercises as told by your physical therapist. Lifestyle Maintain a healthy weight. Extra weight puts stress on your back and makes it difficult to have good posture. Avoid activities or situations that make you feel anxious or stressed. Stress and anxiety increase muscle tension and can make back pain worse. Learn ways to manage anxiety and stress, such as through exercise. General instructions Sleep on a firm mattress in a comfortable position. Try lying on your side with your knees slightly bent. If you lie on your back, put a pillow under your knees. Keep your head and neck in a straight line with your spine (neutral position) when using electronic equipment like smartphones or pads. To do this: Raise your smartphone or pad to look at it instead of bending your head or neck to look down. Put the smartphone or pad at the level of your face while looking at the screen. Follow your treatment plan as told by your health care provider. This may include: Cognitive or behavioral  therapy. Acupuncture or massage therapy. Meditation or yoga. Contact a health care provider if: You have pain that is not relieved with rest or medicine. You have increasing pain going down into your legs or buttocks. Your pain does not improve after 2 weeks. You have pain at night. You lose weight without trying. You have a fever or chills. You develop nausea or vomiting. You develop abdominal pain. Get help right away if: You develop new bowel or bladder control problems. You have unusual weakness or numbness in your arms or legs. You feel faint. These symptoms may represent a serious problem that is an emergency. Do not wait to see if the symptoms will go away. Get medical help right away. Call your local emergency services (911 in the [place] Do not drive yourself to the hospital. Summary Acute back pain is sudden and usually short-lived. Use proper lifting techniques. When you bend and lift, use positions that put less stress on your back. Take over-the-counter and prescription medicines only as told by your health care provider, and apply heat or ice as told. This information is not intended to replace advice given to you by your health care provider. Make sure you discuss any questions you have  with your health care provider. Document Revised: 01/19/2021 Document Reviewed: 01/19/2021 Elsevier Patient Education  2024 [employer].
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
## 2023-12-17 LAB — CBC WITH DIFFERENTIAL/PLATELET
Basophils Absolute: 0.1 10*3/uL (ref 0.0–0.2)
Basos: 1 %
EOS (ABSOLUTE): 0.1 10*3/uL (ref 0.0–0.4)
Eos: 1 %
Hematocrit: 35.1 % (ref 34.0–46.6)
Hemoglobin: 9.6 g/dL — ABNORMAL LOW (ref 11.1–15.9)
Immature Grans (Abs): 0 10*3/uL (ref 0.0–0.1)
Immature Granulocytes: 0 %
Lymphocytes Absolute: 2.3 10*3/uL (ref 0.7–3.1)
Lymphs: 28 %
MCH: 21.4 pg — ABNORMAL LOW (ref 26.6–33.0)
MCHC: 27.4 g/dL — ABNORMAL LOW (ref 31.5–35.7)
MCV: 78 fL — ABNORMAL LOW (ref 79–97)
Monocytes Absolute: 0.8 10*3/uL (ref 0.1–0.9)
Monocytes: 10 %
Neutrophils Absolute: 5 10*3/uL (ref 1.4–7.0)
Neutrophils: 60 %
Platelets: 435 10*3/uL (ref 150–450)
RBC: 4.49 x10E6/uL (ref 3.77–5.28)
RDW: 24.8 % — ABNORMAL HIGH (ref 11.7–15.4)
WBC: 8.2 10*3/uL (ref 3.4–10.8)

## 2023-12-17 LAB — IRON,TIBC AND FERRITIN PANEL
Ferritin: 58 ng/mL (ref 15–150)
Iron Saturation: 91 % (ref 15–55)
Iron: 335 ug/dL (ref 27–159)
Total Iron Binding Capacity: 369 ug/dL (ref 250–450)
UIBC: 34 ug/dL — ABNORMAL LOW (ref 131–425)

## 2023-12-17 LAB — VITAMIN D 25 HYDROXY (VIT D DEFICIENCY, FRACTURES): Vit D, 25-Hydroxy: 11.2 ng/mL — ABNORMAL LOW (ref 30.0–100.0)

## 2023-12-17 MED ORDER — VITAMIN D (ERGOCALCIFEROL) 1.25 MG (50000 UNIT) PO CAPS: 50000.0000 [IU] | ORAL_CAPSULE | ORAL | 2 refills | Status: DC

## 2023-12-29 ENCOUNTER — Encounter: Payer: Self-pay | Admitting: Physician Assistant

## 2023-12-29 ENCOUNTER — Ambulatory Visit: Payer: No Typology Code available for payment source | Admitting: Physician Assistant

## 2023-12-29 VITALS — BP 155/98 | HR 86 | Ht 63.5 in | Wt 203.8 lb

## 2023-12-29 DIAGNOSIS — J069 Acute upper respiratory infection, unspecified: Secondary | ICD-10-CM

## 2023-12-29 DIAGNOSIS — F1410 Cocaine abuse, uncomplicated: Secondary | ICD-10-CM

## 2023-12-29 DIAGNOSIS — I10 Essential (primary) hypertension: Secondary | ICD-10-CM | POA: Diagnosis not present

## 2023-12-29 DIAGNOSIS — F17210 Nicotine dependence, cigarettes, uncomplicated: Secondary | ICD-10-CM

## 2023-12-29 DIAGNOSIS — K5903 Drug induced constipation: Secondary | ICD-10-CM | POA: Diagnosis not present

## 2023-12-29 DIAGNOSIS — M62838 Other muscle spasm: Secondary | ICD-10-CM

## 2023-12-29 DIAGNOSIS — F1090 Alcohol use, unspecified, uncomplicated: Secondary | ICD-10-CM

## 2023-12-29 MED ORDER — FLUTICASONE PROPIONATE 50 MCG/ACT NA SUSP: 2.0000 | Freq: Every day | NASAL | 6 refills | Status: DC

## 2023-12-29 MED ORDER — POLYETHYLENE GLYCOL 3350 17 GM/SCOOP PO POWD
ORAL | 1 refills | Status: DC
Start: 2023-12-29 — End: 2024-07-17

## 2023-12-29 MED ORDER — BENZONATATE 100 MG PO CAPS: ORAL_CAPSULE | ORAL | 0 refills | Status: DC

## 2023-12-29 MED ORDER — LOSARTAN POTASSIUM 100 MG PO TABS: 50.0000 mg | ORAL_TABLET | Freq: Every day | ORAL | 1 refills | Status: DC

## 2023-12-29 MED ORDER — CETIRIZINE HCL 10 MG PO TABS: 10.0000 mg | ORAL_TABLET | Freq: Every day | ORAL | 11 refills | Status: DC

## 2023-12-29 MED ORDER — CYCLOBENZAPRINE HCL 10 MG PO TABS: 10.0000 mg | ORAL_TABLET | Freq: Three times a day (TID) | ORAL | 0 refills | Status: DC | PRN

## 2023-12-29 NOTE — Patient Instructions (Signed)
VISIT SUMMARY:  During today's visit, we addressed several of your health concerns, including elevated blood pressure, muscle spasms, symptoms of a viral illness, constipation, and ongoing issues with anemia and vitamin D deficiency. We have made some adjustments to your medications and provided recommendations to help manage your symptoms.  YOUR PLAN:  -HYPERTENSION: Hypertension means high blood pressure, which can increase the risk of heart disease and stroke. We have increased your Losartan dose to 100mg  daily and requested that the facility monitor your blood pressure daily.  -MUSCLE SPASMS: Muscle spasms are sudden, involuntary contractions of a muscle or group of muscles. We have prescribed Methocarbamol to help relieve your leg and foot spasms, especially at night.  -UPPER RESPIRATORY INFECTION: An upper respiratory infection is a viral infection that affects the nose, throat, and airways. We have prescribed symptomatic treatment to help manage your cough and sinus symptoms.  -CONSTIPATION: Constipation is when you have infrequent bowel movements or difficulty passing stools. We have prescribed Miralax to be taken as needed to help relieve your constipation.  -ANEMIA AND VITAMIN D DEFICIENCY: Anemia is a condition where you don't have enough healthy red blood cells to carry adequate oxygen to your body's tissues, and vitamin D deficiency can affect bone health. Continue your current iron supplementation and monitor your symptoms.  INSTRUCTIONS:  Please follow up in 2 weeks to reassess your symptoms and blood pressure control. Make sure the facility monitors your blood pressure daily and take your medications as prescribed.
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
## 2023-12-30 ENCOUNTER — Encounter: Payer: Self-pay | Admitting: Physician Assistant

## 2023-12-30 LAB — POC COVID19 BINAXNOW: SARS Coronavirus 2 Ag: NEGATIVE

## 2024-01-12 ENCOUNTER — Encounter: Payer: Self-pay | Admitting: Physician Assistant

## 2024-01-12 ENCOUNTER — Ambulatory Visit: Admitting: Physician Assistant

## 2024-01-12 ENCOUNTER — Other Ambulatory Visit (HOSPITAL_COMMUNITY)
Admission: RE | Admit: 2024-01-12 | Discharge: 2024-01-12 | Disposition: A | Discharge status: Home / Self Care | Source: Ambulatory Visit | Attending: Physician Assistant | Admitting: Physician Assistant

## 2024-01-12 VITALS — BP 145/93 | HR 96 | Ht 63.5 in | Wt 216.0 lb

## 2024-01-12 DIAGNOSIS — I10 Essential (primary) hypertension: Secondary | ICD-10-CM

## 2024-01-12 DIAGNOSIS — F1090 Alcohol use, unspecified, uncomplicated: Secondary | ICD-10-CM

## 2024-01-12 DIAGNOSIS — E039 Hypothyroidism, unspecified: Secondary | ICD-10-CM

## 2024-01-12 DIAGNOSIS — N760 Acute vaginitis: Secondary | ICD-10-CM

## 2024-01-12 DIAGNOSIS — M25552 Pain in left hip: Secondary | ICD-10-CM

## 2024-01-12 DIAGNOSIS — K219 Gastro-esophageal reflux disease without esophagitis: Secondary | ICD-10-CM

## 2024-01-12 DIAGNOSIS — B9689 Other specified bacterial agents as the cause of diseases classified elsewhere: Secondary | ICD-10-CM | POA: Insufficient documentation

## 2024-01-12 DIAGNOSIS — M62838 Other muscle spasm: Secondary | ICD-10-CM

## 2024-01-12 DIAGNOSIS — M25551 Pain in right hip: Secondary | ICD-10-CM

## 2024-01-12 DIAGNOSIS — E559 Vitamin D deficiency, unspecified: Secondary | ICD-10-CM

## 2024-01-12 DIAGNOSIS — F1410 Cocaine abuse, uncomplicated: Secondary | ICD-10-CM

## 2024-01-12 DIAGNOSIS — D500 Iron deficiency anemia secondary to blood loss (chronic): Secondary | ICD-10-CM

## 2024-01-12 MED ORDER — PANTOPRAZOLE SODIUM 40 MG PO TBEC: 40.0000 mg | DELAYED_RELEASE_TABLET | Freq: Every day | ORAL | 1 refills | Status: DC

## 2024-01-12 MED ORDER — LOSARTAN POTASSIUM 100 MG PO TABS: 100.0000 mg | ORAL_TABLET | Freq: Every day | ORAL | 1 refills | Status: DC

## 2024-01-12 MED ORDER — MELOXICAM 7.5 MG PO TABS
7.5000 mg | ORAL_TABLET | Freq: Every day | ORAL | 0 refills | Status: DC
Start: 2024-01-12 — End: 2024-07-15

## 2024-01-12 MED ORDER — LEVOTHYROXINE SODIUM 50 MCG PO TABS: 50.0000 ug | ORAL_TABLET | Freq: Every day | ORAL | 1 refills | Status: DC

## 2024-01-12 MED ORDER — METRONIDAZOLE 0.75 % VA GEL: 1.0000 | Freq: Every day | VAGINAL | 0 refills | Status: AC

## 2024-01-12 MED ORDER — CYCLOBENZAPRINE HCL 10 MG PO TABS: 10.0000 mg | ORAL_TABLET | Freq: Three times a day (TID) | ORAL | 0 refills | Status: DC | PRN

## 2024-01-12 MED ORDER — METRONIDAZOLE 500 MG PO TABS: 500.0000 mg | ORAL_TABLET | Freq: Two times a day (BID) | ORAL | 0 refills | Status: DC

## 2024-01-12 NOTE — Patient Instructions (Signed)
 VISIT SUMMARY:  During your visit, we discussed several health concerns including high blood pressure, muscle spasms, hip pain, constipation, iron overload, and vaginal discharge. We have made adjustments to your treatment plan to address these issues and improve your overall well-being.  YOUR PLAN:  -HYPERTENSION: Hypertension, or high blood pressure, is when the force of the blood against your artery walls is too high. Your blood pressure reading was 145/93. We will continue to monitor your blood pressure closely.  -MUSCLE SPASMS: Muscle spasms are sudden, involuntary contractions of a muscle. You have been experiencing these in your legs at night. Continue taking Flexeril as needed and start daily stretching exercises to help alleviate the muscle tightness.  -HIP PAIN: Hip pain can be caused by various conditions affecting the hip joint. You have been experiencing severe hip pain that extends to your lower back. We are starting you on Mobic (Meloxicam) daily with lunch to reduce inflammation and recommending stretching exercises. If the pain persists, we may refer you to an orthopedic specialist.  -CONSTIPATION: Constipation is when you have infrequent bowel movements or difficulty passing stool. You have been experiencing this and straining during bowel movements. Continue taking Miralax as needed, stay well hydrated, and consume a high-fiber diet.  -IRON OVERLOAD: Iron overload occurs when there is too much iron in the body, often due to excessive supplementation. Your iron levels are elevated, so we are reducing your iron supplementation to every other day.  -VAGINAL DISCHARGE: Vaginal discharge can be a sign of infection or other conditions. You have reported yellowish discharge and itching. We are starting you on Metronidazole daily for suspected bacterial vaginosis and will obtain a vaginal swab for further testing.  INSTRUCTIONS:  Please follow up with [place] if you experience any new  symptoms or if your current symptoms worsen. Continue monitoring your blood pressure and keep a record of your readings. Ensure you are taking your medications as prescribed and follow the recommended lifestyle changes, such as daily stretching exercises and a high-fiber diet. If your hip pain does not improve, we may need to refer you to an orthopedic specialist.
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
## 2024-01-14 LAB — CERVICOVAGINAL ANCILLARY ONLY
Bacterial Vaginitis (gardnerella): POSITIVE — AB
Candida Glabrata: NEGATIVE
Candida Vaginitis: NEGATIVE
Chlamydia: NEGATIVE
Comment: NEGATIVE
Comment: NEGATIVE
Comment: NEGATIVE
Comment: NEGATIVE
Comment: NEGATIVE
Comment: NORMAL
Neisseria Gonorrhea: NEGATIVE
Trichomonas: NEGATIVE

## 2024-07-14 ENCOUNTER — Other Ambulatory Visit: Payer: Self-pay

## 2024-07-14 ENCOUNTER — Encounter (HOSPITAL_COMMUNITY): Payer: Self-pay | Admitting: *Deleted

## 2024-07-14 ENCOUNTER — Emergency Department (HOSPITAL_COMMUNITY)
Admission: EM | Admit: 2024-07-14 | Discharge: 2024-07-15 | Disposition: A | Discharge status: Other Acute Inpatient Hospital | Attending: Emergency Medicine | Admitting: Emergency Medicine

## 2024-07-14 DIAGNOSIS — Z59811 Housing instability, housed, with risk of homelessness: Secondary | ICD-10-CM | POA: Diagnosis not present

## 2024-07-14 DIAGNOSIS — F411 Generalized anxiety disorder: Secondary | ICD-10-CM | POA: Diagnosis not present

## 2024-07-14 DIAGNOSIS — R45851 Suicidal ideations: Secondary | ICD-10-CM | POA: Insufficient documentation

## 2024-07-14 DIAGNOSIS — F17210 Nicotine dependence, cigarettes, uncomplicated: Secondary | ICD-10-CM | POA: Diagnosis not present

## 2024-07-14 DIAGNOSIS — F1410 Cocaine abuse, uncomplicated: Secondary | ICD-10-CM | POA: Diagnosis not present

## 2024-07-14 DIAGNOSIS — F1420 Cocaine dependence, uncomplicated: Secondary | ICD-10-CM | POA: Diagnosis not present

## 2024-07-14 DIAGNOSIS — Z79899 Other long term (current) drug therapy: Secondary | ICD-10-CM | POA: Diagnosis not present

## 2024-07-14 DIAGNOSIS — F1090 Alcohol use, unspecified, uncomplicated: Secondary | ICD-10-CM | POA: Insufficient documentation

## 2024-07-14 DIAGNOSIS — Z9151 Personal history of suicidal behavior: Secondary | ICD-10-CM | POA: Diagnosis not present

## 2024-07-14 DIAGNOSIS — E039 Hypothyroidism, unspecified: Secondary | ICD-10-CM | POA: Diagnosis not present

## 2024-07-14 DIAGNOSIS — F1490 Cocaine use, unspecified, uncomplicated: Secondary | ICD-10-CM | POA: Diagnosis not present

## 2024-07-14 DIAGNOSIS — F1010 Alcohol abuse, uncomplicated: Secondary | ICD-10-CM | POA: Diagnosis not present

## 2024-07-14 DIAGNOSIS — Z91128 Patient's intentional underdosing of medication regimen for other reason: Secondary | ICD-10-CM | POA: Diagnosis not present

## 2024-07-14 DIAGNOSIS — T43506A Underdosing of unspecified antipsychotics and neuroleptics, initial encounter: Secondary | ICD-10-CM | POA: Diagnosis not present

## 2024-07-14 DIAGNOSIS — I10 Essential (primary) hypertension: Secondary | ICD-10-CM | POA: Insufficient documentation

## 2024-07-14 DIAGNOSIS — F1020 Alcohol dependence, uncomplicated: Secondary | ICD-10-CM | POA: Diagnosis present

## 2024-07-14 DIAGNOSIS — F333 Major depressive disorder, recurrent, severe with psychotic symptoms: Secondary | ICD-10-CM | POA: Insufficient documentation

## 2024-07-14 DIAGNOSIS — R440 Auditory hallucinations: Secondary | ICD-10-CM | POA: Diagnosis present

## 2024-07-14 LAB — CBC
HCT: 33.7 % — ABNORMAL LOW (ref 36.0–46.0)
Hemoglobin: 10.3 g/dL — ABNORMAL LOW (ref 12.0–15.0)
MCH: 23.8 pg — ABNORMAL LOW (ref 26.0–34.0)
MCHC: 30.6 g/dL (ref 30.0–36.0)
MCV: 78 fL — ABNORMAL LOW (ref 80.0–100.0)
Platelets: 380 K/uL (ref 150–400)
RBC: 4.32 MIL/uL (ref 3.87–5.11)
RDW: 19.3 % — ABNORMAL HIGH (ref 11.5–15.5)
WBC: 13.7 K/uL — ABNORMAL HIGH (ref 4.0–10.5)
nRBC: 0 % (ref 0.0–0.2)

## 2024-07-14 LAB — COMPREHENSIVE METABOLIC PANEL WITH GFR
ALT: 25 U/L (ref 0–44)
AST: 29 U/L (ref 15–41)
Albumin: 4.1 g/dL (ref 3.5–5.0)
Alkaline Phosphatase: 119 U/L (ref 38–126)
Anion gap: 13 (ref 5–15)
BUN: 10 mg/dL (ref 6–20)
CO2: 22 mmol/L (ref 22–32)
Calcium: 9.6 mg/dL (ref 8.9–10.3)
Chloride: 105 mmol/L (ref 98–111)
Creatinine, Ser: 0.64 mg/dL (ref 0.44–1.00)
GFR, Estimated: >60 mL/min (ref >60)
Glucose, Bld: 72 mg/dL (ref 70–99)
Potassium: 4.3 mmol/L (ref 3.5–5.1)
Sodium: 139 mmol/L (ref 135–145)
Total Bilirubin: 0.4 mg/dL (ref 0.0–1.2)
Total Protein: 7.6 g/dL (ref 6.5–8.1)

## 2024-07-14 LAB — URINE DRUG SCREEN
Amphetamines: NEGATIVE
Barbiturates: NEGATIVE
Benzodiazepines: NEGATIVE
Cocaine: POSITIVE — AB
Fentanyl: NEGATIVE
Methadone Scn, Ur: NEGATIVE
Opiates: NEGATIVE
Tetrahydrocannabinol: NEGATIVE

## 2024-07-14 LAB — ACETAMINOPHEN LEVEL: Acetaminophen (Tylenol), Serum: <10 ug/mL — ABNORMAL LOW (ref 10–30)

## 2024-07-14 LAB — ETHANOL: Alcohol, Ethyl (B): <15 mg/dL (ref <15)

## 2024-07-14 NOTE — ED Provider Notes (Signed)
 [facility] Provider Note   CSN: [id number] Arrival date & time: 07/14/24  1510     Patient presents with: Suicidal   Marie Atkins is a 53 y.o. female.  {Add pertinent medical, surgical, social history, OB history to HPI:32947} HPI     53 year old female with a history of hypertension, hypothyroidism, depression, anemia, GERD, insomnia, presents to concern for suicidal ideation.  She was dropped off by a friend with concern for suicidal thoughts, drug use, alcohol use.  Reports that she has been having hallucinations telling her to hurt herself.  Also notes hip pain with a recent fall.  She relapsed on crack and alcohol 3 days ago with last crack use last night, last alcohol use today.  Past Medical History:  Diagnosis Date   Cocaine abuse (HCC)    Depression    HTN (hypertension)    Hypothyroid    Iron deficiency anemia      Prior to Admission medications   Medication Sig Start Date End Date Taking? Authorizing Provider  benzonatate  (TESSALON ) 100 MG capsule Take 1-2 caps PO TID PRN Patient not taking: Reported on 01/12/2024 12/29/23   Mayers, Cari S, PA-C  cetirizine  (ZYRTEC  ALLERGY) 10 MG tablet Take 1 tablet (10 mg total) by mouth daily. 12/29/23   Mayers, Cari S, PA-C  cyclobenzaprine  (FLEXERIL ) 10 MG tablet Take 1 tablet (10 mg total) by mouth 3 (three) times daily as needed for muscle spasms. 01/12/24   Mayers, Cari S, PA-C  ferrous sulfate  325 (65 FE) MG tablet Take 1 tablet (325 mg total) by mouth daily. 12/15/23   Mayers, Cari S, PA-C  fluticasone  (FLONASE ) 50 MCG/ACT nasal spray Place 2 sprays into both nostrils daily. 12/29/23   Mayers, Cari S, PA-C  levothyroxine  (SYNTHROID ) 50 MCG tablet Take 1 tablet (50 mcg total) by mouth daily at 6 (six) AM. 01/12/24   Mayers, Cari S, PA-C  losartan  (COZAAR ) 100 MG tablet Take 1 tablet (100 mg total) by mouth daily. 01/12/24   Mayers, Cari S, PA-C  meloxicam  (MOBIC ) 7.5 MG tablet Take 1 tablet  (7.5 mg total) by mouth daily. With lunch 01/12/24   Mayers, Cari S, PA-C  pantoprazole  (PROTONIX ) 40 MG tablet Take 1 tablet (40 mg total) by mouth daily. 01/12/24   Mayers, Cari S, PA-C  polyethylene glycol powder (GLYCOLAX /MIRALAX ) 17 GM/SCOOP powder Take 1/2 -1 capful PO once daily PRN Patient not taking: Reported on 01/12/2024 12/29/23   Mayers, Cari S, PA-C  Vitamin D , Ergocalciferol , (DRISDOL ) 1.25 MG (50000 UNIT) CAPS capsule Take 1 capsule (50,000 Units total) by mouth every 7 (seven) days. 12/17/23   Mayers, Cari S, PA-C    Allergies: Ace inhibitors and Lisinopril    Review of Systems  Updated Vital Signs BP (!) 179/93 (BP Location: Left Arm)   Pulse (!) 103   Temp 98.2 F (36.8 C) (Oral)   Resp 18   Wt 98 kg   LMP  (LMP Unknown)   SpO2 96%   BMI 37.66 kg/m   Physical Exam  (all labs ordered are listed, but only abnormal results are displayed) Labs Reviewed  CBC - Abnormal; Notable for the following components:      Result Value   WBC 13.7 (*)    Hemoglobin 10.3 (*)    HCT 33.7 (*)    MCV 78.0 (*)    MCH 23.8 (*)    RDW 19.3 (*)    All other components within normal limits  COMPREHENSIVE METABOLIC PANEL WITH GFR  ETHANOL  URINE DRUG SCREEN  ACETAMINOPHEN  LEVEL    EKG: None  Radiology: No results found.  {Document cardiac monitor, telemetry assessment procedure when appropriate:32947} Procedures   Medications Ordered in the ED - No data to display    {Click here for ABCD2, HEART and other calculators REFRESH Note before signing:1}                              Medical Decision Making Amount and/or Complexity of Data Reviewed Labs: ordered.   ***  {Document critical care time when appropriate  Document review of labs and clinical decision tools ie CHADS2VASC2, etc  Document your independent review of radiology images and any outside records  Document your discussion with family members, caretakers and with consultants  Document social determinants of  health affecting pt's care  Document your decision making why or why not admission, treatments were needed:32947:::1}   Final diagnoses:  None    ED Discharge Orders     None

## 2024-07-14 NOTE — BH Assessment (Signed)
 Patient was deferred to [facility] for a telepsych assessment. The assigned care coordinator will provide updates regarding the scheduling of the assessment. [facility]IS coordinator can be reached at [phone] for further information on the timing of the telepsych evaluation.

## 2024-07-14 NOTE — ED Triage Notes (Signed)
 Dropped off by friend to ED for medical clearance for SI. Endorses: suicidal, drug use, ETOH, command hallucinations to hurt self, hip pain, recent fall, medication non-adherence. Relapsed on crack and ETOH 3 days ago. Last crack last night. Last ETOH today. Hearing people talk low and soft verbalizing decreased worth and telling her to hurt/ kill self. Alert, NAD, calm, interactive.

## 2024-07-14 NOTE — ED Notes (Signed)
 Attempted triage. Unable to find pt.

## 2024-07-15 ENCOUNTER — Other Ambulatory Visit (HOSPITAL_COMMUNITY)
Admission: EM | Admit: 2024-07-15 | Discharge: 2024-07-15 | Disposition: A | Discharge status: Other Acute Inpatient Hospital | Source: Intra-hospital

## 2024-07-15 ENCOUNTER — Other Ambulatory Visit (INDEPENDENT_AMBULATORY_CARE_PROVIDER_SITE_OTHER)
Admission: EM | Admit: 2024-07-15 | Discharge: 2024-07-17 | Disposition: A | Discharge status: Home / Self Care | Source: Intra-hospital | Attending: Psychiatry | Admitting: Psychiatry

## 2024-07-15 ENCOUNTER — Encounter (HOSPITAL_COMMUNITY): Payer: Self-pay | Admitting: Psychiatry

## 2024-07-15 DIAGNOSIS — F1010 Alcohol abuse, uncomplicated: Secondary | ICD-10-CM | POA: Insufficient documentation

## 2024-07-15 DIAGNOSIS — F333 Major depressive disorder, recurrent, severe with psychotic symptoms: Secondary | ICD-10-CM

## 2024-07-15 DIAGNOSIS — R45851 Suicidal ideations: Secondary | ICD-10-CM | POA: Diagnosis not present

## 2024-07-15 DIAGNOSIS — Z59819 Housing instability, housed unspecified: Secondary | ICD-10-CM

## 2024-07-15 DIAGNOSIS — Z9151 Personal history of suicidal behavior: Secondary | ICD-10-CM | POA: Insufficient documentation

## 2024-07-15 DIAGNOSIS — F1410 Cocaine abuse, uncomplicated: Secondary | ICD-10-CM | POA: Insufficient documentation

## 2024-07-15 DIAGNOSIS — F1910 Other psychoactive substance abuse, uncomplicated: Secondary | ICD-10-CM

## 2024-07-15 DIAGNOSIS — Z91128 Patient's intentional underdosing of medication regimen for other reason: Secondary | ICD-10-CM | POA: Insufficient documentation

## 2024-07-15 DIAGNOSIS — F1490 Cocaine use, unspecified, uncomplicated: Secondary | ICD-10-CM

## 2024-07-15 DIAGNOSIS — F17210 Nicotine dependence, cigarettes, uncomplicated: Secondary | ICD-10-CM | POA: Insufficient documentation

## 2024-07-15 DIAGNOSIS — F411 Generalized anxiety disorder: Secondary | ICD-10-CM

## 2024-07-15 DIAGNOSIS — E039 Hypothyroidism, unspecified: Secondary | ICD-10-CM

## 2024-07-15 DIAGNOSIS — F1420 Cocaine dependence, uncomplicated: Secondary | ICD-10-CM

## 2024-07-15 DIAGNOSIS — T43506A Underdosing of unspecified antipsychotics and neuroleptics, initial encounter: Secondary | ICD-10-CM | POA: Insufficient documentation

## 2024-07-15 DIAGNOSIS — I10 Essential (primary) hypertension: Secondary | ICD-10-CM

## 2024-07-15 DIAGNOSIS — Z59811 Housing instability, housed, with risk of homelessness: Secondary | ICD-10-CM | POA: Insufficient documentation

## 2024-07-15 LAB — POC URINE PREG, ED: Preg Test, Ur: NEGATIVE

## 2024-07-15 LAB — TSH: TSH: 0.727 u[IU]/mL (ref 0.350–4.500)

## 2024-07-15 MED ORDER — LORAZEPAM 2 MG/ML IJ SOLN: 2.0000 mg | Freq: Three times a day (TID) | INTRAMUSCULAR | Status: DC | PRN

## 2024-07-15 MED ORDER — METHOCARBAMOL 500 MG PO TABS: 500.0000 mg | ORAL_TABLET | Freq: Three times a day (TID) | ORAL | Status: DC | PRN

## 2024-07-15 MED ORDER — ACETAMINOPHEN 325 MG PO TABS: 650.0000 mg | ORAL_TABLET | Freq: Four times a day (QID) | ORAL | Status: DC | PRN

## 2024-07-15 MED ORDER — HYDROXYZINE HCL 25 MG PO TABS: 25.0000 mg | ORAL_TABLET | Freq: Four times a day (QID) | ORAL | Status: DC | PRN

## 2024-07-15 MED ORDER — DIPHENHYDRAMINE HCL 50 MG PO CAPS: 50.0000 mg | ORAL_CAPSULE | Freq: Three times a day (TID) | ORAL | Status: DC | PRN

## 2024-07-15 MED ORDER — NICOTINE POLACRILEX 2 MG MT GUM
2.0000 mg | CHEWING_GUM | OROMUCOSAL | Status: DC | PRN
  Filled 2024-07-15: qty 1

## 2024-07-15 MED ORDER — DIPHENHYDRAMINE HCL 50 MG/ML IJ SOLN: 50.0000 mg | Freq: Three times a day (TID) | INTRAMUSCULAR | Status: DC | PRN

## 2024-07-15 MED ORDER — MAGNESIUM HYDROXIDE 400 MG/5ML PO SUSP: 30.0000 mL | Freq: Every day | ORAL | Status: DC | PRN

## 2024-07-15 MED ORDER — FLUOXETINE HCL 10 MG PO CAPS: 10.0000 mg | ORAL_CAPSULE | Freq: Every day | ORAL | Status: DC

## 2024-07-15 MED ORDER — BUSPIRONE HCL 10 MG PO TABS: 5.0000 mg | ORAL_TABLET | Freq: Three times a day (TID) | ORAL | Status: DC

## 2024-07-15 MED ORDER — HALOPERIDOL LACTATE 5 MG/ML IJ SOLN: 10.0000 mg | Freq: Three times a day (TID) | INTRAMUSCULAR | Status: DC | PRN

## 2024-07-15 MED ORDER — HALOPERIDOL 5 MG PO TABS: 5.0000 mg | ORAL_TABLET | Freq: Three times a day (TID) | ORAL | Status: DC | PRN

## 2024-07-15 MED ORDER — DICYCLOMINE HCL 20 MG PO TABS: 20.0000 mg | ORAL_TABLET | Freq: Four times a day (QID) | ORAL | Status: DC | PRN

## 2024-07-15 MED ORDER — ALUM & MAG HYDROXIDE-SIMETH 200-200-20 MG/5ML PO SUSP: 30.0000 mL | ORAL | Status: DC | PRN

## 2024-07-15 MED ORDER — ARIPIPRAZOLE 2 MG PO TABS: 2.0000 mg | ORAL_TABLET | Freq: Every day | ORAL | Status: DC

## 2024-07-15 MED ORDER — ONDANSETRON 4 MG PO TBDP: 4.0000 mg | ORAL_TABLET | Freq: Four times a day (QID) | ORAL | Status: DC | PRN

## 2024-07-15 MED ORDER — LOSARTAN POTASSIUM 50 MG PO TABS
25.0000 mg | ORAL_TABLET | Freq: Every day | ORAL | Status: DC
  Administered 2024-07-15 – 2024-07-16 (×2): 25 mg via ORAL
  Filled 2024-07-15 (×2): qty 1

## 2024-07-15 MED ORDER — NALTREXONE HCL 50 MG PO TABS
50.0000 mg | ORAL_TABLET | Freq: Every day | ORAL | Status: DC
  Filled 2024-07-15: qty 1

## 2024-07-15 MED ORDER — LOPERAMIDE HCL 2 MG PO CAPS: 2.0000 mg | ORAL_CAPSULE | ORAL | Status: DC | PRN

## 2024-07-15 MED ORDER — HALOPERIDOL LACTATE 5 MG/ML IJ SOLN: 5.0000 mg | Freq: Three times a day (TID) | INTRAMUSCULAR | Status: DC | PRN

## 2024-07-15 MED ORDER — NAPROXEN 500 MG PO TABS: 500.0000 mg | ORAL_TABLET | Freq: Two times a day (BID) | ORAL | Status: DC | PRN

## 2024-07-15 NOTE — Group Note (Signed)
 Group Topic: Feelings about Diagnosis  Group Date: 07/15/2024 Start Time: 0800 End Time: 0845 Facilitators: Lonzell Dwayne RAMAN, NT  Department: [facility]  Number of Participants: 5  Group Focus: chemical dependency issues Treatment Modality:  Psychoeducation Interventions utilized were patient education Purpose: enhance coping skills  Name: Marie Atkins Date of Birth: September 30, 1971  MR: [id number]    Level of Participation: Patient did attend groupactive Quality of Participation: attentive Interactions with others: gave feedback Mood/Affect: positive Triggers (if applicable): N/A Cognition: coherent/clear Progress: Gaining insight Response: Appropriate  Plan: patient will be encouraged to continue to work on trying to relax more and continue with her rehab, stay positive.  Patients Problems:  Patient Active Problem List   Diagnosis Date Noted   GAD (generalized anxiety disorder) 07/15/2024   Cocaine use 07/15/2024   Suicidal ideation 07/15/2024   Vitamin D  deficiency 12/15/2023   Severe recurrent major depressive disorder with psychotic features (HCC) 08/10/2023   Hypothyroidism 03/27/2023   Uncomplicated alcohol dependence (HCC) 03/03/2023   Cocaine use disorder (HCC) 03/03/2023   Tobacco use disorder 03/03/2023   MDD (major depressive disorder), recurrent, severe, with psychosis (HCC) 03/02/2023   Severe recurrent major depression without psychotic features (HCC) 06/24/2020   Menorrhagia 06/16/2020   Iron deficiency anemia due to chronic blood loss 06/16/2020   Proteinuria 06/16/2020   Depression 06/16/2020   Tobacco dependence due to cigarettes 06/16/2020   ASCUS with positive high risk HPV cervical 06/16/2020   Amphetamine and psychostimulant-induced psychosis with delusions (HCC)    Benign essential HTN 02/16/2016   History of bilateral tubal ligation 08/27/2014

## 2024-07-15 NOTE — Group Note (Signed)
 Group Topic: Wellness  Group Date: 07/15/2024 Start Time: 1700 End Time: 1730 Facilitators: Daved Tinnie HERO, RN  Department: [facility]  Number of Participants: 3  Group Focus: psychiatric education Treatment Modality:  Psychoeducation Interventions utilized were patient education Purpose: increase insight  Name: Marie Atkins Date of Birth: 1971/04/14  MR: [id number]    Level of Participation: active Quality of Participation: attentive and cooperative Interactions with others: gave feedback Mood/Affect: appropriate Triggers (if applicable): n/a Cognition: coherent/clear Progress: Gaining insight Response: pt identifies the loss of her children, says she will navigate grief and loss by staying clean and loving herself  Plan: patient will be encouraged to attend future RN education groups  Patients Problems:  Patient Active Problem List   Diagnosis Date Noted   GAD (generalized anxiety disorder) 07/15/2024   Cocaine use 07/15/2024   Suicidal ideation 07/15/2024   Vitamin D  deficiency 12/15/2023   Severe recurrent major depressive disorder with psychotic features (HCC) 08/10/2023   Hypothyroidism 03/27/2023   Uncomplicated alcohol dependence (HCC) 03/03/2023   Cocaine use disorder (HCC) 03/03/2023   Tobacco use disorder 03/03/2023   MDD (major depressive disorder), recurrent, severe, with psychosis (HCC) 03/02/2023   Severe recurrent major depression without psychotic features (HCC) 06/24/2020   Menorrhagia 06/16/2020   Iron deficiency anemia due to chronic blood loss 06/16/2020   Proteinuria 06/16/2020   Depression 06/16/2020   Tobacco dependence due to cigarettes 06/16/2020   ASCUS with positive high risk HPV cervical 06/16/2020   Amphetamine and psychostimulant-induced psychosis with delusions (HCC)    Benign essential HTN 02/16/2016   History of bilateral tubal ligation 08/27/2014

## 2024-07-15 NOTE — ED Notes (Signed)
 Pt presented to [facility] from [facility] as a direct admit to the [facility] for mental health and substance abuse(cocaine and alcohol) treatment. Patient reports she presented to the ED yesterday due to suicidal ideations with a plan to slit her wrist, auditory hallucinations and relapsing on crack cocaine and alcohol.  Patient reports ongoing suicidal ideations and worsening depression for a couple of weeks.Patient identifies her stressors as relationship problems, missing her children, housing challenges, unemployment and polysubstance abuse, and not able to live the way she used to live.Admission process completed.

## 2024-07-15 NOTE — ED Provider Notes (Signed)
 Facility Based Crisis Admission H&P  Date: 07/15/24 Patient Name: Marie Atkins MRN: [id number] Chief Complaint:  I'm suicidal with a plan to slit my wrist  Diagnoses:  Final diagnoses:  MDD (major depressive disorder), recurrent, severe, with psychosis (HCC)  Suicidal ideations  Substance abuse (HCC)  Housing instability    HPI: Marie Atkins is a 53 year old female with psychiatric history of MDD, GAD, and polysubstance abuse-cocaine, tobacco, alcohol, who presented to [facility] from [facility] as a direct admit to the [facility] for mental health and substance abuse treatment.  Patient was seen face-to-face by this provider and chart reviewed.  Patient reports she presented to the ED yesterday due to suicidal ideations with a plan to slit her wrist, auditory hallucinations and relapsing on crack cocaine and alcohol.   Patient reports ongoing suicidal ideations and worsening depression for a couple of weeks.  Patient identifies her stressors as relationship problems, missing her children, housing challenges, unemployment and polysubstance abuse.  Patient endorsed using crack cocaine and alcohol. .  Patient reports she last drank alcohol yesterday.  She is unsure of the amount but said that she drank beer and wine. Patient reports she also smoked crack cocaine yesterday same time as the alcohol.  Patient endorses a history of suicide attempt where she tried to slit her wrist and overdose on pills last year.  Patient endorses a history of inpatient psychiatric admission-multiple last in Jan. Previous Detox/Residential treatments:[facility] in [place] recently this year   Patient reports she is not established with an outpatient psychiatrist or therapist.  Patient reports she is prescribed about 10 medications from her last inpatient admission, but she is not medication adherent. Previous psychotropic medication trials include: aripiprazole , fluoxetine , gabapentin  melatoinin, mirtazapine  sertraline   trazodone  risperidone  naltrexone .    Patient endorses depressive symptoms, including low mood, sleep alteration, loss of interest in pleasurable activities, feelings of guilt/worthlessness/hopelessness, problems with energy, problems with concentration, appetite disturbance, and suicidal ideations.     Patient endorses auditory hallucinations with voices she cannot make out what they are saying. Per chart review at the ED, she endorsed Hearing people talk low and soft verbalizing decreased worth and telling her to hurt/ kill self. Patient endorses a history of auditory hallucinations over the years.  Patient completed the PHQ-9 questionnaire and obtained a total score of 27, indicating severe depression.  On evaluation, patient is alert, oriented x 3, and cooperative. Speech is clear and coherent. Pt appears appropriately dressed. Eye contact is good. Mood is depressed, affect is congruent with mood. Thought process is coherent and thought content is WDL. Pt endorses SI with plan, denies HI/VH, endorses AH.  There is no objective indication that the patient is responding to internal stimuli. No delusions elicited during this assessment.  Discussed admission to the [facility] for substance abuse and mental health treatment.  She is provided with opportunity for questions.  She verbalized understanding and is in agreement.  PHQ 2-9:  Flowsheet Row ED from 07/15/2024 in [facility] Office Visit from 06/16/2020 in [facility] [place]. [facility] ED from 06/15/2020 in [facility] Emergency Department at [facility]  Thoughts that you would be better off dead, or of hurting yourself in some way Nearly every day Several days Not at all  PHQ-9 Total Score 27 17 9     Flowsheet Row ED from 07/14/2024 in [facility] Emergency Department at [facility] ED from 12/02/2023 in [facility]  [facility]  C-SSRS RISK CATEGORY High Risk No Risk High Risk      Total Time spent with patient: 30 minutes  Musculoskeletal  Strength & Muscle Tone: within normal limits Gait & Station: normal Patient leans: N/A  Psychiatric Specialty Exam  Presentation General Appearance:  Appropriate for Environment  Eye Contact: Good  Speech: Clear and Coherent  Speech Volume: Normal  Handedness: Right   Mood and Affect  Mood: Depressed  Affect: Congruent   Thought Process  Thought Processes: Coherent  Descriptions of Associations:Intact  Orientation:Full (Time, Place and Person)  Thought Content:WDL  Diagnosis of Schizophrenia or Schizoaffective disorder in past: No   Hallucinations:Hallucinations: Auditory Description of Auditory Hallucinations: Pt reportss indistinct voices  Ideas of Reference:None  Suicidal Thoughts:Suicidal Thoughts: Yes, Active SI Active Intent and/or Plan: With Plan  Homicidal Thoughts:Homicidal Thoughts: No   Sensorium  Memory: Immediate Fair  Judgment: Intact  Insight: Present   Executive Functions  Concentration: Good  Attention Span: Good  Recall: Fair  Fund of Knowledge: Good  Language: Good   Psychomotor Activity  Psychomotor Activity: Psychomotor Activity: Normal   Assets  Assets: Communication Skills; Desire for Improvement   Sleep  Sleep: Sleep: Poor   Nutritional Assessment (For OBS and [facility]BC admissions only) Has the patient had a weight loss or gain of 10 pounds or more in the last 3 months?: No Has the patient had a decrease in food intake/or appetite?: No Does the patient have dental problems?: No Does the patient have eating habits or behaviors that may be indicators of an eating disorder including binging or inducing vomiting?: No Has the patient recently lost weight without trying?: 0 Has the patient been  eating poorly because of a decreased appetite?: 0 Malnutrition Screening Tool Score: 0    Physical Exam Constitutional:      General: She is not in acute distress.    Appearance: She is not diaphoretic.  HENT:     Nose: No congestion.  Cardiovascular:     Rate and Rhythm: Normal rate.  Pulmonary:     Effort: No respiratory distress.  Chest:     Chest wall: No tenderness.  Neurological:     Mental Status: She is alert and oriented to person, place, and time.  Psychiatric:        Attention and Perception: She perceives auditory hallucinations.        Mood and Affect: Mood is depressed.        Speech: Speech normal.        Behavior: Behavior is actively hallucinating. Behavior is cooperative.        Thought Content: Thought content includes suicidal ideation. Thought content includes suicidal plan.    Review of Systems  Constitutional:  Negative for chills, diaphoresis and fever.  HENT:  Negative for congestion.   Eyes:  Negative for discharge.  Respiratory:  Negative for cough, shortness of breath and wheezing.   Cardiovascular:  Negative for chest pain and palpitations.  Gastrointestinal:  Negative for diarrhea, nausea and vomiting.  Neurological:  Negative for dizziness, seizures, loss of consciousness and headaches.  Psychiatric/Behavioral:  Positive for depression, hallucinations, substance abuse and suicidal ideas.     Blood pressure (!) 141/96, pulse 99, temperature 98.7 F (37.1 C), temperature source Oral, resp. rate 20, SpO2 98%. There is no height or weight on file to calculate BMI.  Past Psychiatric History: See H &  P   Is the patient at risk to self? Yes  Has the patient been a risk to self in the past 6 months? Yes .    Has the patient been a risk to self within the distant past? Yes   Is the patient a risk to others? No   Has the patient been a risk to others in the past 6 months? No   Has the patient been a risk to others within the distant past? No    Past Medical History: See Chart Family History: N/A Social History: N/A  Last Labs:  Admission on 07/15/2024  Component Date Value Ref Range Status   Preg Test, Ur 07/15/2024 Negative  Negative Final  Admission on 07/14/2024, Discharged on 07/15/2024  Component Date Value Ref Range Status   Sodium 07/14/2024 139  135 - 145 mmol/L Final   Potassium 07/14/2024 4.3  3.5 - 5.1 mmol/L Final   Chloride 07/14/2024 105  98 - 111 mmol/L Final   CO2 07/14/2024 22  22 - 32 mmol/L Final   Glucose, Bld 07/14/2024 72  70 - 99 mg/dL Final   Glucose reference range applies only to samples taken after fasting for at least 8 hours.   BUN 07/14/2024 10  6 - 20 mg/dL Final   Creatinine, Ser 07/14/2024 0.64  0.44 - 1.00 mg/dL Final   Calcium 90/96/7974 9.6  8.9 - 10.3 mg/dL Final   Total Protein 90/96/7974 7.6  6.5 - 8.1 g/dL Final   Albumin 90/96/7974 4.1  3.5 - 5.0 g/dL Final   AST 90/96/7974 29  15 - 41 U/L Final   ALT 07/14/2024 25  0 - 44 U/L Final   Alkaline Phosphatase 07/14/2024 119  38 - 126 U/L Final   Total Bilirubin 07/14/2024 0.4  0.0 - 1.2 mg/dL Final   GFR, Estimated 07/14/2024 >60  >60 mL/min Final   Comment: (NOTE) Calculated using the CKD-EPI Creatinine Equation (2021)    Anion gap 07/14/2024 13  5 - 15 Final   Performed at [facility], [address] [place], [place] [address]   Alcohol, Ethyl (B) 07/14/2024 <15  <15 mg/dL Final   Comment: (NOTE) For medical purposes only. Performed at [facility], [address], [place], [place] [address]    WBC 07/14/2024 13.7 (H)  4.0 - 10.5 K/uL Final   RBC 07/14/2024 4.32  3.87 - 5.11 MIL/uL Final   Hemoglobin 07/14/2024 10.3 (L)  12.0 - 15.0 g/dL Final   HCT 90/96/7974 33.7 (L)  36.0 - 46.0 % Final   MCV 07/14/2024 78.0 (L)  80.0 - 100.0 fL Final   MCH 07/14/2024 23.8 (L)  26.0 - 34.0 pg Final   MCHC 07/14/2024 30.6  30.0 - 36.0 g/dL Final   RDW 90/96/7974 19.3 (H)  11.5 - 15.5 % Final    Platelets 07/14/2024 380  150 - 400 K/uL Final   nRBC 07/14/2024 0.0  0.0 - 0.2 % Final   Performed at [facility], [address] [place], [place] [address]   Opiates 07/14/2024 NEGATIVE  NEGATIVE Final   Cocaine 07/14/2024 POSITIVE (A)  NEGATIVE Final   Benzodiazepines 07/14/2024 NEGATIVE  NEGATIVE Final   Amphetamines 07/14/2024 NEGATIVE  NEGATIVE Final   Tetrahydrocannabinol 07/14/2024 NEGATIVE  NEGATIVE Final   Barbiturates 07/14/2024 NEGATIVE  NEGATIVE Final   Methadone Scn, Ur 07/14/2024 NEGATIVE  NEGATIVE Final   Fentanyl 07/14/2024 NEGATIVE  NEGATIVE Final   Comment: (NOTE) Drug screen is for  Medical Purposes only. Positive results are preliminary only. If confirmation is needed, notify lab within 5 days.  Drug Class                 Cutoff (ng/mL) Amphetamine and metabolites 1000 Barbiturate and metabolites 200 Benzodiazepine              200 Opiates and metabolites     300 Cocaine and metabolites     300 THC                         50 Fentanyl                    5 Methadone                   300  Trazodone  is metabolized in vivo to several metabolites,  including pharmacologically active m-CPP, which is excreted in the  urine.  Immunoassay screens for amphetamines and MDMA have potential  cross-reactivity with these compounds and may provide false positive  result.  Performed at [facility], [address] [place], [place] [address]    Acetaminophen  (Tylenol ), Serum 07/14/2024 <10 (L)  10 - 30 ug/mL Final   Comment: (NOTE) Toxic concentrations can be more effectively related to post dose interval; > 200, > 100, and > 50 ug/mL serum concentrations correspond to toxic concentrations at 4, 8, and 12 hours post dose, respectively.  Performed at [facility], [address], [place], [place] [address]     Allergies: Ace inhibitors and Lisinopril  Medications:  Facility Ordered Medications  Medication    alum & mag hydroxide-simeth (MAALOX/MYLANTA) 200-200-20 MG/5ML suspension 30 mL   magnesium  hydroxide (MILK OF MAGNESIA) suspension 30 mL   dicyclomine  (BENTYL ) tablet 20 mg   hydrOXYzine  (ATARAX ) tablet 25 mg   loperamide  (IMODIUM ) capsule 2-4 mg   methocarbamol  (ROBAXIN ) tablet 500 mg   naproxen  (NAPROSYN ) tablet 500 mg   ondansetron  (ZOFRAN -ODT) disintegrating tablet 4 mg   haloperidol  (HALDOL ) tablet 5 mg   And   diphenhydrAMINE  (BENADRYL ) capsule 50 mg   haloperidol  lactate (HALDOL ) injection 5 mg   And   diphenhydrAMINE  (BENADRYL ) injection 50 mg   And   LORazepam  (ATIVAN ) injection 2 mg   haloperidol  lactate (HALDOL ) injection 10 mg   And   diphenhydrAMINE  (BENADRYL ) injection 50 mg   And   LORazepam  (ATIVAN ) injection 2 mg   losartan  (COZAAR ) tablet 25 mg   PTA Medications  Medication Sig   ferrous sulfate  325 (65 FE) MG tablet Take 1 tablet (325 mg total) by mouth daily.   Vitamin D , Ergocalciferol , (DRISDOL ) 1.25 MG (50000 UNIT) CAPS capsule Take 1 capsule (50,000 Units total) by mouth every 7 (seven) days.   polyethylene glycol powder (GLYCOLAX /MIRALAX ) 17 GM/SCOOP powder Take 1/2 -1 capful PO once daily PRN (Patient not taking: Reported on 01/12/2024)   benzonatate  (TESSALON ) 100 MG capsule Take 1-2 caps PO TID PRN (Patient not taking: Reported on 01/12/2024)   fluticasone  (FLONASE ) 50 MCG/ACT nasal spray Place 2 sprays into both nostrils daily.   cetirizine  (ZYRTEC  ALLERGY) 10 MG tablet Take 1 tablet (10 mg total) by mouth daily.   meloxicam  (MOBIC ) 7.5 MG tablet Take 1 tablet (7.5 mg total) by mouth daily. With lunch   cyclobenzaprine  (FLEXERIL ) 10 MG tablet Take 1 tablet (10 mg total) by mouth 3 (three) times daily as needed for muscle spasms.   levothyroxine  (SYNTHROID ) 50  MCG tablet Take 1 tablet (50 mcg total) by mouth daily at 6 (six) AM.   losartan  (COZAAR ) 100 MG tablet Take 1 tablet (100 mg total) by mouth daily.   pantoprazole  (PROTONIX ) 40 MG tablet Take 1  tablet (40 mg total) by mouth daily.    Long Term Goals: Improvement in symptoms so as ready for discharge  Short Term Goals: Patient will verbalize feelings in meetings with treatment team members., Patient will attend at least of 50% of the groups daily., Pt will complete the PHQ9 on admission, day 3 and discharge., Patient will participate in completing the [place] Suicide Severity Rating Scale, Patient will score a low risk of violence for 24 hours prior to discharge, and Patient will take medications as prescribed daily.  Medical Decision Making  Patient is a direct admit to the [facility] from [facility] for mental health and substance abuse treatment.  I have reviewed the labs and medications administered at the ED: BAL WNL.   Lab Orders         TSH         POC urine preg, ED     EKG  Recommend COWS protocol  Home medication continued - Cozaar  25 mg p.o. daily for hypertension  Other PRNs -Agitation protocol medications   Recommendations  Based on my evaluation the patient does not appear to have an emergency medical condition.  Patient is a direct admit to the [facility] from [facility] for mental health and substance abuse treatment.   Thurman LULLA Ivans, NP 07/15/24  6:13 AM

## 2024-07-15 NOTE — ED Notes (Signed)
 Pt generally isolative in room, however, is polite and cooperative. Pt attended nursing group, providing feedback. Pt ate dinner, no reports of concerns or complaints.
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
## 2024-07-15 NOTE — Group Note (Signed)
 Group Topic: Emotional Regulation  Group Date: 07/15/2024 Start Time: 2030 End Time: 2100 Facilitators: Verdon Jacqualyn BRAVO, NT  Department: [facility]  Number of Participants: 10  Group Focus: depression Treatment Modality:  Individual Therapy Interventions utilized were group exercise Purpose: express feelings  Name: Marie Atkins Date of Birth: June 02, 1971  MR: [id number]    Level of Participation: active Quality of Participation: cooperative Interactions with others: gave feedback Mood/Affect: appropriate Triggers (if applicable): n/a Cognition: coherent/clear Progress: Gaining insight Response: group exercise Plan: follow-up needed  Patients Problems:  Patient Active Problem List   Diagnosis Date Noted   GAD (generalized anxiety disorder) 07/15/2024   Cocaine use 07/15/2024   Suicidal ideation 07/15/2024   Vitamin D  deficiency 12/15/2023   Severe recurrent major depressive disorder with psychotic features (HCC) 08/10/2023   Hypothyroidism 03/27/2023   Uncomplicated alcohol dependence (HCC) 03/03/2023   Cocaine use disorder (HCC) 03/03/2023   Tobacco use disorder 03/03/2023   MDD (major depressive disorder), recurrent, severe, with psychosis (HCC) 03/02/2023   Severe recurrent major depression without psychotic features (HCC) 06/24/2020   Menorrhagia 06/16/2020   Iron deficiency anemia due to chronic blood loss 06/16/2020   Proteinuria 06/16/2020   Depression 06/16/2020   Tobacco dependence due to cigarettes 06/16/2020   ASCUS with positive high risk HPV cervical 06/16/2020   Amphetamine and psychostimulant-induced psychosis with delusions (HCC)    Benign essential HTN 02/16/2016   History of bilateral tubal ligation 08/27/2014

## 2024-07-15 NOTE — ED Notes (Signed)
 Pt presents as depressed , but cooperative. Pt reports passive si with no plan or intent. Pt declined breakfast, was provided with comb for hygiene needs as requested. Pt denies physical pain and discomforts.

## 2024-07-15 NOTE — Care Management (Signed)
 [facility] Care Management   Writer has been referred to the [facility] CD-IOP program.

## 2024-07-15 NOTE — ED Notes (Signed)
 PT IS NOT ON THE UNIT

## 2024-07-16 MED ORDER — LOSARTAN POTASSIUM 50 MG PO TABS
100.0000 mg | ORAL_TABLET | Freq: Every day | ORAL | Status: DC
  Administered 2024-07-17: 100 mg via ORAL
  Filled 2024-07-16: qty 2

## 2024-07-16 MED ORDER — SERTRALINE HCL 50 MG PO TABS
50.0000 mg | ORAL_TABLET | Freq: Every day | ORAL | Status: DC
  Administered 2024-07-16 – 2024-07-17 (×2): 50 mg via ORAL
  Filled 2024-07-16 (×2): qty 1

## 2024-07-16 MED ORDER — WHITE PETROLATUM EX OINT
TOPICAL_OINTMENT | CUTANEOUS | Status: AC
  Filled 2024-07-16: qty 5

## 2024-07-16 MED ORDER — LEVOTHYROXINE SODIUM 25 MCG PO TABS
50.0000 ug | ORAL_TABLET | Freq: Every day | ORAL | Status: DC
  Administered 2024-07-17: 50 ug via ORAL
  Filled 2024-07-16: qty 2

## 2024-07-16 NOTE — Discharge Instructions (Addendum)
 Based on the information you have provided and the presenting issue, outpatient services and resources for have been recommended.  It is imperative that you follow through with treatment recommendations within 5-7 days from the of discharge to mitigate further risk to your safety and mental well-being. A list of referrals has been provided below to get you started.  You are not limited to the list provided.  In case of an urgent crisis, you may contact the Mobile Crisis Unit with [facility] at [phone].                      [facility] CD[facility]   Specialized Group Therapy for Substance Abuse If you have a substance abuse disorder (with or without a mental health condition), you may benefit from specialized substance abuse therapy in a group setting. According to discharge survey data, 70 percent of patients completing our chemical dependency intensive outpatient program report fewer symptoms of substance abuse and incidents of relapse.  Under the guidance of a licensed mental health professional, meet with your peers every Monday, Wednesday and Friday from 9 a.m. to 12 p.m. for 6 to 8 weeks to:  Learn about chemical dependency, mental illness and co-occurring disorders. Develop relapse-prevention skills. Set personalized goals with your treatment team. To build on the skills you gain, you can attend Alcoholics Anonymous or Narcotics Anonymous meetings in the evenings and access follow-up care through weekly group meetings with peers. Ongoing support promotes wellness and recovery.  For more information, call Zell Maier, LCSW at [phone]. We work directly with employers and families to ensure you receive the care you need.    You are scheduled for your follow up SAIOP assessment upstairs on the second floor on Wednesday, September 10th @ 1:00 PM. Please arrive 20 minutes early for your appointment and bring your photo ID and insurance card. The initial  assessment will be approximately 1 1/2 hours.     Non-Emergent / Urgent   [facility] [address] SECOND FLOOR [place], [place] [address] [phone]  [facility] CD-IOP [facility]   Specialized Group Therapy for Substance Abuse If you have a substance abuse disorder (with or without a mental health condition), you may benefit from specialized substance abuse therapy in a group setting. According to discharge survey data, 70 percent of patients completing our chemical dependency intensive outpatient program report fewer symptoms of substance abuse and incidents of relapse.  Under the guidance of a licensed mental health professional, meet with your peers every Monday, Wednesday and Friday from 9 a.m. to 12 p.m. for 6 to 8 weeks to:  Learn about chemical dependency, mental illness and co-occurring disorders. Develop relapse-prevention skills. Set personalized goals with your treatment team. To build on the skills you gain, you can attend Alcoholics Anonymous or Narcotics Anonymous meetings in the evenings and access follow-up care through weekly group meetings with peers. Ongoing support promotes wellness and recovery.  For more information, call Zell Maier, LCSW at [phone]. We work directly with employers and families to ensure you receive the care you need.

## 2024-07-16 NOTE — ED Notes (Signed)
 Pt sleeping at the moment. No acute distress noted.

## 2024-07-16 NOTE — Discharge Planning (Signed)
 SW spoke with patient and she stated she was going to be discharging to her niece's home tomorrow at 11:00 and would have transportation arranged. She has a follow up scheduled for IOP next week in her AVS and will need scripts for her medications.

## 2024-07-16 NOTE — ED Notes (Signed)
 Pt sleeping currently, no acute distress noted. Routine facility checks in place.

## 2024-07-16 NOTE — [facility] Group Notes (Signed)
 SPIRITUALITY GROUP NOTE   Spirituality group facilitated by Chaplain Trigger Frasier, MDiv, [facility].   Group Description:  Group focused on topic of hope.  Patients participated in facilitated discussion around topic, connecting with one another around experiences and definitions for hope.  Group members engaged with visual explorer photos, reflecting on what hope looks like for them today.  Group engaged in discussion around how their definitions of hope are present today in hospital.    Modalities: Psycho-social ed, Adlerian, Narrative, MI  Patient Progress:  Marie Atkins was present throughout group.  Engaged in conversation - Noted connection to her faith and remembering stories of her mother who was a Armed forces training and education officer.

## 2024-07-16 NOTE — ED Notes (Signed)
 Pt was observed and assessed in her room. Pt reports feeling weak and weird because she hasn't been given her home medications since she got here. She also reports taking losartan  100mg  but she was given 25mg  all the while she has been here. Writer informed NP and synthroid  has been ordered. Pt has been informed to speak with a.m MD in the morning about the other medications.

## 2024-07-16 NOTE — ED Notes (Signed)
 Pt observed lying in bed. Eyes closed respirations even and non labored. NAD q 15 minute observations continue for safety.
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
## 2024-07-16 NOTE — ED Provider Notes (Signed)
 Behavioral Health Progress Note  Date and Time: 07/16/2024 2:23 PM Name: Marie Atkins MRN:  [id number]  Subjective:  Marie Atkins was seen this morning in her room on rounds. She feels that she is doing okay. She slept okay but her appetite is still pretty low. She is anxious about housing, and has not worked out any solid solutions. Yet. She says that she usually takes losartan  100mg  PO daily, and that her blood pressure has been running higher than usual here due to the decreased dose.   Diagnosis:  Final diagnoses:  MDD (major depressive disorder), recurrent, severe, with psychosis (HCC)  Suicidal ideations  Substance abuse (HCC)  Housing instability    Total Time spent with patient: 20 minutes  Past Psychiatric History: See H & P  Past Medical History: See Chart Family History: N/A Social History: N/A  Sleep: Good  Appetite:  Poor  Current Medications:  Current Facility-Administered Medications  Medication Dose Route Frequency Provider Last Rate Last Admin   alum & mag hydroxide-simeth (MAALOX/MYLANTA) 200-200-20 MG/5ML suspension 30 mL  30 mL Oral Q4H PRN Atkins, Marie V, NP       dicyclomine  (BENTYL ) tablet 20 mg  20 mg Oral Q6H PRN Atkins, Marie V, NP       haloperidol  (HALDOL ) tablet 5 mg  5 mg Oral TID PRN Atkins, Marie V, NP       And   diphenhydrAMINE  (BENADRYL ) capsule 50 mg  50 mg Oral TID PRN Atkins, Marie V, NP       haloperidol  lactate (HALDOL ) injection 5 mg  5 mg Intramuscular TID PRN Atkins, Marie V, NP       And   diphenhydrAMINE  (BENADRYL ) injection 50 mg  50 mg Intramuscular TID PRN Atkins, Marie V, NP       And   LORazepam  (ATIVAN ) injection 2 mg  2 mg Intramuscular TID PRN Atkins, Marie V, NP       haloperidol  lactate (HALDOL ) injection 10 mg  10 mg Intramuscular TID PRN Atkins, Marie V, NP       And   diphenhydrAMINE  (BENADRYL ) injection 50 mg  50 mg Intramuscular TID PRN Atkins, Marie V, NP       And   LORazepam  (ATIVAN )  injection 2 mg  2 mg Intramuscular TID PRN Atkins, Marie V, NP       hydrOXYzine  (ATARAX ) tablet 25 mg  25 mg Oral Q6H PRN Atkins, Marie V, NP       loperamide  (IMODIUM ) capsule 2-4 mg  2-4 mg Oral PRN Atkins, Marie V, NP       [START ON 07/17/2024] losartan  (COZAAR ) tablet 100 mg  100 mg Oral Daily Marie Atkins, Marie Massa, MD       magnesium  hydroxide (MILK OF MAGNESIA) suspension 30 mL  30 mL Oral Daily PRN Atkins, Marie V, NP       methocarbamol  (ROBAXIN ) tablet 500 mg  500 mg Oral Q8H PRN Atkins, Marie V, NP       naproxen  (NAPROSYN ) tablet 500 mg  500 mg Oral BID PRN Atkins, Marie V, NP       nicotine  polacrilex (NICORETTE ) gum 2 mg  2 mg Oral PRN Marie Atkins, Marie Massa, MD       ondansetron  (ZOFRAN -ODT) disintegrating tablet 4 mg  4 mg Oral Q6H PRN Atkins, Marie V, NP       sertraline  (ZOLOFT ) tablet 50 mg  50 mg Oral Daily Marie Atkins, Marie Massa, MD   50 mg at 07/16/24 1152   Current  Outpatient Medications  Medication Sig Dispense Refill   amLODipine  (NORVASC ) 10 MG tablet Take 10 mg by mouth at bedtime.     busPIRone  (BUSPAR ) 5 MG tablet Take 5 mg by mouth 3 (three) times daily.     FLUoxetine  (PROZAC ) 20 MG capsule Take 60 mg by mouth daily.     polyethylene glycol powder (GLYCOLAX /MIRALAX ) 17 GM/SCOOP powder Take 1/2 -1 capful PO once daily PRN (Patient taking differently: Take 8.5-17 g by mouth daily as needed for mild constipation or moderate constipation. Take 1/2 -1 capful PO once daily PRN) 3350 g 1   cetirizine  (ZYRTEC  ALLERGY) 10 MG tablet Take 1 tablet (10 mg total) by mouth daily. 30 tablet 11   cyclobenzaprine  (FLEXERIL ) 10 MG tablet Take 1 tablet (10 mg total) by mouth 3 (three) times daily as needed for muscle spasms. 30 tablet 0   ferrous sulfate  325 (65 FE) MG tablet Take 1 tablet (325 mg total) by mouth daily. 30 tablet 1   fluticasone  (FLONASE ) 50 MCG/ACT nasal spray Place 2 sprays into both nostrils daily. (Patient taking differently: Place 2  sprays into both nostrils daily as needed for allergies.) 16 g 6   levothyroxine  (SYNTHROID ) 50 MCG tablet Take 1 tablet (50 mcg total) by mouth daily at 6 (six) AM. 30 tablet 1   losartan  (COZAAR ) 100 MG tablet Take 1 tablet (100 mg total) by mouth daily. 30 tablet 1   meloxicam  (MOBIC ) 15 MG tablet Take 15 mg by mouth daily.     mirtazapine  (REMERON ) 15 MG tablet Take 15 mg by mouth at bedtime.     pantoprazole  (PROTONIX ) 40 MG tablet Take 1 tablet (40 mg total) by mouth daily. 30 tablet 1   Vitamin D , Ergocalciferol , (DRISDOL ) 1.25 MG (50000 UNIT) CAPS capsule Take 1 capsule (50,000 Units total) by mouth every 7 (seven) days. 4 capsule 2    Labs  Lab Results:  Admission on 07/15/2024  Component Date Value Ref Range Status   Preg Test, Ur 07/15/2024 Negative  Negative Final   TSH 07/15/2024 0.727  0.350 - 4.500 uIU/mL Final   Comment: Performed by a 3rd Generation assay with a functional sensitivity of <=0.01 uIU/mL. Performed at [facility], [address], [place], [place] [address]   Admission on 07/14/2024, Discharged on 07/15/2024  Component Date Value Ref Range Status   Sodium 07/14/2024 139  135 - 145 mmol/L Final   Potassium 07/14/2024 4.3  3.5 - 5.1 mmol/L Final   Chloride 07/14/2024 105  98 - 111 mmol/L Final   CO2 07/14/2024 22  22 - 32 mmol/L Final   Glucose, Bld 07/14/2024 72  70 - 99 mg/dL Final   Glucose reference range applies only to samples taken after fasting for at least 8 hours.   BUN 07/14/2024 10  6 - 20 mg/dL Final   Creatinine, Ser 07/14/2024 0.64  0.44 - 1.00 mg/dL Final   Calcium 90/96/7974 9.6  8.9 - 10.3 mg/dL Final   Total Protein 90/96/7974 7.6  6.5 - 8.1 g/dL Final   Albumin 90/96/7974 4.1  3.5 - 5.0 g/dL Final   AST 90/96/7974 29  15 - 41 U/L Final   ALT 07/14/2024 25  0 - 44 U/L Final   Alkaline Phosphatase 07/14/2024 119  38 - 126 U/L Final   Total Bilirubin 07/14/2024 0.4  0.0 - 1.2 mg/dL Final   GFR, Estimated 07/14/2024 >60  >60 mL/min  Final   Comment: (NOTE) Calculated using the CKD-EPI Creatinine  Equation (2021)    Anion gap 07/14/2024 13  5 - 15 Final   Performed at [facility], [address] [place], [place] [address]   Alcohol, Ethyl (B) 07/14/2024 <15  <15 mg/dL Final   Comment: (NOTE) For medical purposes only. Performed at [facility], [address] [place], [place] [address]    WBC 07/14/2024 13.7 (H)  4.0 - 10.5 K/uL Final   RBC 07/14/2024 4.32  3.87 - 5.11 MIL/uL Final   Hemoglobin 07/14/2024 10.3 (L)  12.0 - 15.0 g/dL Final   HCT 90/96/7974 33.7 (L)  36.0 - 46.0 % Final   MCV 07/14/2024 78.0 (L)  80.0 - 100.0 fL Final   MCH 07/14/2024 23.8 (L)  26.0 - 34.0 pg Final   MCHC 07/14/2024 30.6  30.0 - 36.0 g/dL Final   RDW 90/96/7974 19.3 (H)  11.5 - 15.5 % Final   Platelets 07/14/2024 380  150 - 400 K/uL Final   nRBC 07/14/2024 0.0  0.0 - 0.2 % Final   Performed at [facility], [address] [place], [place] [address]   Opiates 07/14/2024 NEGATIVE  NEGATIVE Final   Cocaine 07/14/2024 POSITIVE (A)  NEGATIVE Final   Benzodiazepines 07/14/2024 NEGATIVE  NEGATIVE Final   Amphetamines 07/14/2024 NEGATIVE  NEGATIVE Final   Tetrahydrocannabinol 07/14/2024 NEGATIVE  NEGATIVE Final   Barbiturates 07/14/2024 NEGATIVE  NEGATIVE Final   Methadone Scn, Ur 07/14/2024 NEGATIVE  NEGATIVE Final   Fentanyl 07/14/2024 NEGATIVE  NEGATIVE Final   Comment: (NOTE) Drug screen is for Medical Purposes only. Positive results are preliminary only. If confirmation is needed, notify lab within 5 days.  Drug Class                 Cutoff (ng/mL) Amphetamine and metabolites 1000 Barbiturate and metabolites 200 Benzodiazepine              200 Opiates and metabolites     300 Cocaine and metabolites     300 THC                         50 Fentanyl                    5 Methadone                   300  Trazodone  is metabolized in vivo to several metabolites,   including pharmacologically active m-CPP, which is excreted in the  urine.  Immunoassay screens for amphetamines and MDMA have potential  cross-reactivity with these compounds and may provide false positive  result.  Performed at [facility], [address] [place], [place] [address]    Acetaminophen  (Tylenol ), Serum 07/14/2024 <10 (L)  10 - 30 ug/mL Final   Comment: (NOTE) Toxic concentrations can be more effectively related to post dose interval; > 200, > 100, and > 50 ug/mL serum concentrations correspond to toxic concentrations at 4, 8, and 12 hours post dose, respectively.  Performed at [facility], [address] [place], [place] [address]     Blood Alcohol level:  Lab Results  Component Value Date   [facility] <15 07/14/2024   ETH <10 08/09/2023    Metabolic Disorder Labs: Lab Results  Component Value Date   HGBA1C 5.3 08/12/2023   MPG 105 08/12/2023   MPG 91 03/05/2023   No results found for: PROLACTIN Lab Results  Component Value Date  CHOL 180 08/12/2023   TRIG 78 08/12/2023   HDL 66 08/12/2023   CHOLHDL 2.7 08/12/2023   VLDL 16 08/12/2023   LDLCALC 98 08/12/2023   LDLCALC 98 03/05/2023    Therapeutic Lab Levels: No results found for: LITHIUM No results found for: VALPROATE No results found for: CBMZ  Physical Findings   AIMS    Flowsheet Row [facility]) [facility] Ad[facility]) [facility]  AIMS Total Score 0 0   AUDIT    Flowsheet Row [facility]) [facility] Ad[facility] (Discharged[facility] Ad[facility] (Discharged) [facility]  Alcohol Use Disorder Identification Test Final Score (AUDIT) 25 34 5   PHQ2-9     Flowsheet Row ED from 07/15/2024 in [facility] Office Visit from 06/16/2020 in [facility] [place] ED from 06/15/2020 in [facility] Department at [facility]  PHQ-2 Total Score 6 4 2   PHQ-9 Total Score 27 17 9    Flowsheet Row ED from 07/15/2024 in [facility] ED from 07/14/2024 in [facility] Emergency Department at [facility] ED from 12/02/2023 in [facility] at [facility]  C-SSRS RISK CATEGORY High Risk High Risk No Risk     Musculoskeletal  Strength & Muscle Tone: within normal limits Gait & Station: normal Patient leans: N/A  Psychiatric Specialty Exam  Presentation  General Appearance:  Casual  Eye Contact: Fair  Speech: Normal Rate  Speech Volume: Normal  Handedness: Right   Mood and Affect  Mood: Depressed; Anxious  Affect: Congruent   Thought Process  Thought Processes: Linear  Descriptions of Associations:Intact  Orientation:Full (Time, Place and Person)  Thought Content:Logical  Diagnosis of Schizophrenia or Schizoaffective disorder in past: No    Hallucinations:Hallucinations: None Description of Auditory Hallucinations: Pt reportss indistinct voices  Ideas of Reference:None  Suicidal Thoughts:Suicidal Thoughts: No SI Active Intent and/or Plan: With Plan SI Passive Intent and/or Plan: Without Intent; Without Plan  Homicidal Thoughts:Homicidal Thoughts: No   Sensorium  Memory: Immediate Good; Recent Fair  Judgment: Fair  Insight: Fair   Executive Functions  Concentration: Good  Attention Span: Good  Recall: Good  Fund of Knowledge: Good  Language: Good   Psychomotor Activity  Psychomotor Activity: Psychomotor Activity: Normal   Assets  Assets: Communication Skills; Desire for Improvement; Resilience   Sleep  Sleep: Sleep: Fair  Estimated  Sleeping Duration (Last 24 Hours): 15.00-19.75 hours  Nutritional Assessment (For OBS and [facility] admissions only) Has the patient had a weight loss or gain of 10 pounds or more in the last 3 months?: No Has the patient had a decrease in food intake/or appetite?: No Does the patient have dental problems?: No Does the patient have eating habits or behaviors that may be indicators of an eating disorder including binging or inducing vomiting?: No Has the patient recently lost weight without trying?: 0 Has the patient been eating poorly because of a decreased appetite?: 0 Malnutrition Screening Tool Score: 0    Physical Exam  Physical Exam Vitals and nursing note reviewed.  Constitutional:      Appearance: Normal appearance.  HENT:     Head: Normocephalic.  Eyes:     Extraocular Movements: Extraocular movements intact.  Pulmonary:     Effort: Pulmonary effort is normal.  Musculoskeletal:        General: Normal range of motion.     Cervical back: Normal range of motion.  Neurological:     General: No focal deficit present.     Mental Status: She is alert and oriented to person, place, and time.  Psychiatric:        Behavior: Behavior normal.    Review of Systems  Constitutional:  Negative for chills and fever.  Gastrointestinal:  Negative for constipation and diarrhea.  Musculoskeletal:  Negative for myalgias.  Neurological:  Negative for dizziness and headaches.  Psychiatric/Behavioral:  The patient has insomnia.    Blood pressure 139/89, pulse 99, temperature 98.1 F (36.7 C), temperature source Oral, resp. rate 16, SpO2 100%. There is no height or weight on file to calculate BMI.  Treatment Plan Summary: Long Term Goals: Improvement in symptoms so as ready for discharge   Short Term Goals: Patient will verbalize feelings in meetings with treatment team members., Patient will attend at least of 50% of the groups daily., Pt will complete the PHQ9 on admission, day 3 and  discharge., and Patient will take medications as prescribed daily.  Medications: Mood/anxiety: continue group therapy, milieu therapy, 1:1 evaluation with provider.  Medication management: started zoloft  50mg  PO daily for anxiety  Substance Abuse:  stimulant use: encourage patient to maintain PO intake. Coverage PRN for secondary psychosis.  Nicotine  and tobacco use: Nicotine  replacement therapy provided.  counseling on cessation of nicotine  use provided Medical: PRNs for pain, constipation, indigestion available.  Labs/studies: UDS + cocaine. WBC 13.7 H/H 10.3/33.7 Increased losartan  to home dose 100mg  PO daily per patient request.  Safety and Monitoring: voluntarily admission to [facility]/Facility based care unit [facility]) unit for safety, stabilization and treatment Daily contact with patient to assess and evaluate symptoms and progress in treatment Patient's case to be discussed in multi-disciplinary team meeting Observation Level : q15 minute checks Vital signs: q12 hours Precautions: Routine and withdrawal  Based on my evaluation the patient does not appear to have an emergency medical condition.   Marie Anette Potters, MD 07/16/2024 2:23 PM

## 2024-07-16 NOTE — Group Note (Signed)
 Group Topic: Recovery Basics  Group Date: 07/16/2024 Start Time: 1230 End Time: 1300 Facilitators: Carletha Iha, RN  Department: [facility]  Number of Participants: 9  Group Focus: coping skills Treatment Modality:  Psychoeducation Interventions utilized were patient education Purpose: enhance coping skills  Name: Samon Suit Date of Birth: [birth year]  MR: [id number]    Level of Participation: did not attend Quality of Participation:  Interactions with others:  Mood/Affect:  Triggers (if applicable):  Cognition:  Progress:  Response:  Plan:   Patients Problems:  Patient Active Problem List   Diagnosis Date Noted   GAD (generalized anxiety disorder) 07/15/2024   Cocaine use 07/15/2024   Suicidal ideation 07/15/2024   Vitamin D  deficiency 12/15/2023   Severe recurrent major depressive disorder with psychotic features (HCC) 08/10/2023   Hypothyroidism 03/27/2023   Uncomplicated alcohol dependence (HCC) 03/03/2023   Cocaine use disorder (HCC) 03/03/2023   Tobacco use disorder 03/03/2023   MDD (major depressive disorder), recurrent, severe, with psychosis (HCC) 03/02/2023   Severe recurrent major depression without psychotic features (HCC) 06/24/2020   Menorrhagia 06/16/2020   Iron deficiency anemia due to chronic blood loss 06/16/2020   Proteinuria 06/16/2020   Depression 06/16/2020   Tobacco dependence due to cigarettes 06/16/2020   ASCUS with positive high risk HPV cervical 06/16/2020   Amphetamine and psychostimulant-induced psychosis with delusions (HCC)    Benign essential HTN 02/16/2016   History of bilateral tubal ligation 08/27/2014

## 2024-07-16 NOTE — ED Notes (Signed)
 Pt observed lying in bed,  Calm upon approach.   Pt focused on medications she has not received yet.  Provider at bedside at that time Q 15 minutes for safety continue

## 2024-07-16 NOTE — ED Notes (Signed)
 Pt is sleeping at the moment. No acute distress noted.

## 2024-07-16 NOTE — Group Note (Signed)
 Group Topic: Change and Accountability  Group Date: 07/16/2024 Start Time: 1930 End Time: 2000 Facilitators: Anice Benton LABOR, NT  Department: [facility]  Number of Participants: 5  Group Focus: acceptance and feeling awareness/expression Treatment Modality:  Individual Therapy Interventions utilized were assignment and reality testing Purpose: explore maladaptive thinking and express feelings  Name: Marie Atkins Date of Birth: [birth year]  MR: [id number]    Level of Participation: minimal Quality of Participation: distractible Interactions with others: N/A Mood/Affect: appropriate Triggers (if applicable): N/A Cognition: not focused Progress: None Response: N/A Plan: follow-up needed  Patients Problems:  Patient Active Problem List   Diagnosis Date Noted   GAD (generalized anxiety disorder) 07/15/2024   Cocaine use 07/15/2024   Suicidal ideation 07/15/2024   Vitamin D  deficiency 12/15/2023   Severe recurrent major depressive disorder with psychotic features (HCC) 08/10/2023   Hypothyroidism 03/27/2023   Uncomplicated alcohol dependence (HCC) 03/03/2023   Cocaine use disorder (HCC) 03/03/2023   Tobacco use disorder 03/03/2023   MDD (major depressive disorder), recurrent, severe, with psychosis (HCC) 03/02/2023   Severe recurrent major depression without psychotic features (HCC) 06/24/2020   Menorrhagia 06/16/2020   Iron deficiency anemia due to chronic blood loss 06/16/2020   Proteinuria 06/16/2020   Depression 06/16/2020   Tobacco dependence due to cigarettes 06/16/2020   ASCUS with positive high risk HPV cervical 06/16/2020   Amphetamine and psychostimulant-induced psychosis with delusions (HCC)    Benign essential HTN 02/16/2016   History of bilateral tubal ligation 08/27/2014

## 2024-07-17 DIAGNOSIS — F333 Major depressive disorder, recurrent, severe with psychotic symptoms: Secondary | ICD-10-CM | POA: Diagnosis not present

## 2024-07-17 DIAGNOSIS — R45851 Suicidal ideations: Secondary | ICD-10-CM | POA: Diagnosis not present

## 2024-07-17 DIAGNOSIS — F1910 Other psychoactive substance abuse, uncomplicated: Secondary | ICD-10-CM

## 2024-07-17 DIAGNOSIS — Z59819 Housing instability, housed unspecified: Secondary | ICD-10-CM

## 2024-07-17 MED ORDER — LOSARTAN POTASSIUM 100 MG PO TABS: 100.0000 mg | ORAL_TABLET | Freq: Every day | ORAL | 0 refills | Status: DC

## 2024-07-17 MED ORDER — HYDROXYZINE HCL 25 MG PO TABS: 25.0000 mg | ORAL_TABLET | Freq: Four times a day (QID) | ORAL | 0 refills | Status: DC | PRN

## 2024-07-17 MED ORDER — SERTRALINE HCL 50 MG PO TABS: 50.0000 mg | ORAL_TABLET | Freq: Every day | ORAL | 0 refills | Status: DC

## 2024-07-17 MED ORDER — LEVOTHYROXINE SODIUM 50 MCG PO TABS: 50.0000 ug | ORAL_TABLET | Freq: Every day | ORAL | 0 refills | Status: DC

## 2024-07-17 NOTE — ED Notes (Signed)
Pt is sleeping at this moment.

## 2024-07-17 NOTE — ED Notes (Signed)

## 2024-07-17 NOTE — ED Notes (Signed)
 Patient A&Ox4. Denies intent to harm self/others when asked. Denies A/VH. Patient denies any physical complaints when asked. No acute distress noted. Support and encouragement provided. Routine safety checks conducted according to facility protocol. Encouraged patient to notify staff if thoughts of harm toward self or others arise. Patient verbalize understanding and agreement. Will continue to monitor for safety.

## 2024-07-17 NOTE — ED Provider Notes (Signed)
 FBC/OBS ASAP Discharge Summary  Date and Time: 07/17/2024 10:06 AM  Name: Marie Atkins  MRN:  [id number]   Discharge Diagnoses:  Final diagnoses:  MDD (major depressive disorder), recurrent, severe, with psychosis (HCC)  Suicidal ideations  Substance abuse (HCC)  Housing instability    Subjective: patient was seen in her room the morning of discharge. She asks appropriate questions regarding her medications. She is sleeping and eating okay. No current issues with hallucinations, paranoia or delusions. She convincingly denies thoughts of harm to self or others. Future orientation is good.   Stay Summary: During the course of patient's hospitalization, the 15-minute checks were adequate to ensure patient's safety. Patient did not exhibit erratic or aggressive behavior and was compliant with scheduled medication. Patient was recommended for outpatient psychiatry follow-up.  At the time of discharge patient is not reporting any acute suicidal/homicidal ideations/AVH, delusional thoughts or paranoia. Patient did not appear to be responding to any internal stimuli. Patient feels more confident about self-care & in managing their mental health problems. Patient currently denies any new issues or concerns. Education and supportive counseling provided throughout patient's hospital stay & upon discharge.  patient was safely detoxed fom stimulants during the course of her stay, and any symptoms of withdrawal were addressed and have since resolved. At time of discharge the patient is not experiencing clinical syptoms of withdrawal and denies having any subjective symptoms. At time of discharge CIWA was 0.   Today upon discharge evaluation, the patient gives a mood of appropriate to circumstances. Patient denies any specific concerns and has no new physical complaints. Patient slept well, appetite good, regular bowel movements. Patient feels that the medications have been helpful & is in agreement to continue  current treatment regimen as recommended. Patient was able to engage in safety planning including plan to return to [facility] the nearest emergency room or contact emergency services if patient feels unable to maintain their own safety or the safety of others. Patient had no further questions, comments, or concerns. Patient left [facility]) with all personal belongings in no apparent distress. Transportation per safe transport to home was arranged for patient.  Total Time spent with patient: 20 minutes  Past Psychiatric History: See H & P  Past Medical History: See Chart Family History: N/A Social History: N/A Tobacco Cessation:  A prescription for an FDA-approved tobacco cessation medication provided at discharge  Current Medications:  Current Facility-Administered Medications  Medication Dose Route Frequency Provider Last Rate Last Admin   alum & mag hydroxide-simeth (MAALOX/MYLANTA) 200-200-20 MG/5ML suspension 30 mL  30 mL Oral Q4H PRN Onuoha, Chinwendu V, NP       dicyclomine  (BENTYL ) tablet 20 mg  20 mg Oral Q6H PRN Onuoha, Chinwendu V, NP       haloperidol  (HALDOL ) tablet 5 mg  5 mg Oral TID PRN Onuoha, Chinwendu V, NP       And   diphenhydrAMINE  (BENADRYL ) capsule 50 mg  50 mg Oral TID PRN Onuoha, Chinwendu V, NP       haloperidol  lactate (HALDOL ) injection 5 mg  5 mg Intramuscular TID PRN Onuoha, Chinwendu V, NP       And   diphenhydrAMINE  (BENADRYL ) injection 50 mg  50 mg Intramuscular TID PRN Onuoha, Chinwendu V, NP       And   LORazepam  (ATIVAN ) injection 2 mg  2 mg Intramuscular TID PRN Onuoha, Chinwendu V, NP       haloperidol  lactate (HALDOL )  injection 10 mg  10 mg Intramuscular TID PRN Onuoha, Chinwendu V, NP       And   diphenhydrAMINE  (BENADRYL ) injection 50 mg  50 mg Intramuscular TID PRN Onuoha, Chinwendu V, NP       And   LORazepam  (ATIVAN ) injection 2 mg  2 mg Intramuscular TID PRN Onuoha, Chinwendu V, NP        hydrOXYzine  (ATARAX ) tablet 25 mg  25 mg Oral Q6H PRN Onuoha, Chinwendu V, NP       levothyroxine  (SYNTHROID ) tablet 50 mcg  50 mcg Oral Q0600 Ajibola, Ene A, NP   50 mcg at 07/17/24 0647   loperamide  (IMODIUM ) capsule 2-4 mg  2-4 mg Oral PRN Onuoha, Chinwendu V, NP       losartan  (COZAAR ) tablet 100 mg  100 mg Oral Daily Tamarion Haymond, Corean Massa, MD   100 mg at 07/17/24 0940   magnesium  hydroxide (MILK OF MAGNESIA) suspension 30 mL  30 mL Oral Daily PRN Onuoha, Chinwendu V, NP       methocarbamol  (ROBAXIN ) tablet 500 mg  500 mg Oral Q8H PRN Onuoha, Chinwendu V, NP       naproxen  (NAPROSYN ) tablet 500 mg  500 mg Oral BID PRN Onuoha, Chinwendu V, NP       nicotine  polacrilex (NICORETTE ) gum 2 mg  2 mg Oral PRN Gimena Buick, Corean Massa, MD       ondansetron  (ZOFRAN -ODT) disintegrating tablet 4 mg  4 mg Oral Q6H PRN Onuoha, Chinwendu V, NP       sertraline  (ZOLOFT ) tablet 50 mg  50 mg Oral Daily Laymon Stockert, Corean Massa, MD   50 mg at 07/17/24 0940   Current Outpatient Medications  Medication Sig Dispense Refill   cetirizine  (ZYRTEC  ALLERGY) 10 MG tablet Take 1 tablet (10 mg total) by mouth daily. 30 tablet 11   ferrous sulfate  325 (65 FE) MG tablet Take 1 tablet (325 mg total) by mouth daily. 30 tablet 1   fluticasone  (FLONASE ) 50 MCG/ACT nasal spray Place 2 sprays into both nostrils daily. (Patient taking differently: Place 2 sprays into both nostrils daily as needed for allergies.) 16 g 6   hydrOXYzine  (ATARAX ) 25 MG tablet Take 1 tablet (25 mg total) by mouth every 6 (six) hours as needed for anxiety. 30 tablet 0   levothyroxine  (SYNTHROID ) 50 MCG tablet Take 1 tablet (50 mcg total) by mouth daily at 6 (six) AM. 30 tablet 0   losartan  (COZAAR ) 100 MG tablet Take 1 tablet (100 mg total) by mouth daily. 30 tablet 0   meloxicam  (MOBIC ) 15 MG tablet Take 15 mg by mouth daily.     pantoprazole  (PROTONIX ) 40 MG tablet Take 1 tablet (40 mg total) by mouth daily. 30 tablet 1   [START ON 07/18/2024] sertraline   (ZOLOFT ) 50 MG tablet Take 1 tablet (50 mg total) by mouth daily. 30 tablet 0   Vitamin D , Ergocalciferol , (DRISDOL ) 1.25 MG (50000 UNIT) CAPS capsule Take 1 capsule (50,000 Units total) by mouth every 7 (seven) days. 4 capsule 2    PTA Medications:  Facility Ordered Medications  Medication   alum & mag hydroxide-simeth (MAALOX/MYLANTA) 200-200-20 MG/5ML suspension 30 mL   magnesium  hydroxide (MILK OF MAGNESIA) suspension 30 mL   dicyclomine  (BENTYL ) tablet 20 mg   hydrOXYzine  (ATARAX ) tablet 25 mg   loperamide  (IMODIUM ) capsule 2-4 mg   methocarbamol  (ROBAXIN ) tablet 500 mg   naproxen  (NAPROSYN ) tablet 500 mg   ondansetron  (ZOFRAN -ODT) disintegrating tablet 4 mg   haloperidol  (HALDOL )  tablet 5 mg   And   diphenhydrAMINE  (BENADRYL ) capsule 50 mg   haloperidol  lactate (HALDOL ) injection 5 mg   And   diphenhydrAMINE  (BENADRYL ) injection 50 mg   And   LORazepam  (ATIVAN ) injection 2 mg   haloperidol  lactate (HALDOL ) injection 10 mg   And   diphenhydrAMINE  (BENADRYL ) injection 50 mg   And   LORazepam  (ATIVAN ) injection 2 mg   nicotine  polacrilex (NICORETTE ) gum 2 mg   losartan  (COZAAR ) tablet 100 mg   sertraline  (ZOLOFT ) tablet 50 mg   [COMPLETED] white petrolatum  (VASELINE) gel   levothyroxine  (SYNTHROID ) tablet 50 mcg   PTA Medications  Medication Sig   ferrous sulfate  325 (65 FE) MG tablet Take 1 tablet (325 mg total) by mouth daily.   Vitamin D , Ergocalciferol , (DRISDOL ) 1.25 MG (50000 UNIT) CAPS capsule Take 1 capsule (50,000 Units total) by mouth every 7 (seven) days.   fluticasone  (FLONASE ) 50 MCG/ACT nasal spray Place 2 sprays into both nostrils daily. (Patient taking differently: Place 2 sprays into both nostrils daily as needed for allergies.)   cetirizine  (ZYRTEC  ALLERGY) 10 MG tablet Take 1 tablet (10 mg total) by mouth daily.   pantoprazole  (PROTONIX ) 40 MG tablet Take 1 tablet (40 mg total) by mouth daily.   losartan  (COZAAR ) 100 MG tablet Take 1 tablet (100 mg  total) by mouth daily.   hydrOXYzine  (ATARAX ) 25 MG tablet Take 1 tablet (25 mg total) by mouth every 6 (six) hours as needed for anxiety.   [START ON 07/18/2024] sertraline  (ZOLOFT ) 50 MG tablet Take 1 tablet (50 mg total) by mouth daily.   levothyroxine  (SYNTHROID ) 50 MCG tablet Take 1 tablet (50 mcg total) by mouth daily at 6 (six) AM.       07/15/2024    5:25 AM 06/16/2020    4:08 PM 06/15/2020    9:56 AM  Depression screen PHQ 2/9  Decreased Interest 3 2 1   Down, Depressed, Hopeless 3 2 1   PHQ - 2 Score 6 4 2   Altered sleeping 3 2 1   Tired, decreased energy 3 2 1   Change in appetite 3 2 1   Feeling bad or failure about yourself  3 2 2   Trouble concentrating 3 2 1   Moving slowly or fidgety/restless 3 2 1   Suicidal thoughts 3 1 0  PHQ-9 Score 27 17 9   Difficult doing work/chores Extremely dIfficult Somewhat difficult Somewhat difficult    Flowsheet Row ED from 07/15/2024 in [facility] ED from 07/14/2024 in [facility] Emergency Department at [facility] ED from 12/02/2023 in [facility] Emergency Department at [facility]  C-SSRS RISK CATEGORY High Risk High Risk No Risk    Musculoskeletal  Strength & Muscle Tone: within normal limits Gait & Station: normal Patient leans: N/A  Psychiatric Specialty Exam  Presentation  General Appearance:  Casual  Eye Contact: Good  Speech: Normal Rate  Speech Volume: Normal  Handedness: Right   Mood and Affect  Mood: Euthymic  Affect: Congruent   Thought Process  Thought Processes: Coherent; Linear  Descriptions of Associations:Intact  Orientation:Full (Time, Place and Person)  Thought Content:Logical  Diagnosis of Schizophrenia or Schizoaffective disorder in past: No    Hallucinations:Hallucinations: None  Ideas of Reference:None  Suicidal Thoughts:Suicidal Thoughts: No  Homicidal Thoughts:Homicidal Thoughts: No   Sensorium  Memory: Immediate Good; Remote  Fair  Judgment: Fair  Insight: Fair   Art therapist  Concentration: Fair  Attention Span: Fair  Recall: [employer] of  Knowledge: Good  Language: Good   Psychomotor Activity  Psychomotor Activity: Psychomotor Activity: Normal   Assets  Assets: Communication Skills; Desire for Improvement; Resilience; Leisure Time   Sleep  Sleep: Sleep: Good  Estimated Sleeping Duration (Last 24 Hours): 14.75-15.50 hours  No data recorded  Physical Exam  Physical Exam Vitals and nursing note reviewed.  Constitutional:      Appearance: Normal appearance.  HENT:     Head: Normocephalic.  Eyes:     Extraocular Movements: Extraocular movements intact.  Pulmonary:     Effort: Pulmonary effort is normal.  Musculoskeletal:        General: Normal range of motion.     Cervical back: Normal range of motion.  Neurological:     General: No focal deficit present.     Mental Status: She is alert and oriented to person, place, and time.  Psychiatric:        Behavior: Behavior normal.    Review of Systems  Constitutional:  Negative for fever.  Respiratory:  Negative for cough.   Cardiovascular:  Negative for chest pain.  Gastrointestinal:  Negative for constipation, diarrhea, nausea and vomiting.  Genitourinary:  Negative for dysuria.  Musculoskeletal:  Negative for myalgias.  Neurological:  Negative for tremors.  Psychiatric/Behavioral:  Negative for hallucinations and suicidal ideas.    Blood pressure 138/75, pulse 73, temperature 98.3 F (36.8 C), resp. rate 18, SpO2 100%. There is no height or weight on file to calculate BMI.  Demographic Factors:  NA  Loss Factors: Financial problems/change in socioeconomic status  Historical Factors: NA  Risk Reduction Factors:   Positive coping skills or problem solving skills  Continued Clinical Symptoms:  Alcohol/Substance Abuse/Dependencies  Cognitive Features That Contribute To Risk:  None    Suicide  Risk:  Minimal: No identifiable suicidal ideation.  Patients presenting with no risk factors but with morbid ruminations; may be classified as minimal risk based on the severity of the depressive symptoms  Plan Of Care/Follow-up recommendations:  Activity:  as tolerated Diet:  low sodium, low fat  Disposition: to home with follow up and community resources  Corean Anette Potters, MD 07/17/2024, 10:06 AM

## 2024-07-21 ENCOUNTER — Telehealth (HOSPITAL_COMMUNITY): Payer: Self-pay

## 2024-07-21 ENCOUNTER — Ambulatory Visit (HOSPITAL_COMMUNITY)

## 2024-07-21 NOTE — Telephone Encounter (Signed)
 Therapist outreaches Marie Atkins as she missed her 1 pm [facility] appointment for [facility] IOP. Female answers the phone and therapist asks to speak to Marie Atkins.  Marie Atkins acknowleges this is her. Therapist confirms her identify by obtaining two verifiers. Therapist explains she missed her appointment at 1pm today. Marie Atkins says she is working and cannot talk.  She says she will call this therapist back and disconnects the call.  Darice Simpler, MS, LMFT, LCAS

## 2024-10-05 ENCOUNTER — Encounter

## 2024-11-03 ENCOUNTER — Other Ambulatory Visit (HOSPITAL_COMMUNITY)
Admission: EM | Admit: 2024-11-03 | Discharge: 2024-11-03 | Disposition: A | Discharge status: Other Acute Inpatient Hospital | Payer: MEDICAID

## 2024-11-03 ENCOUNTER — Other Ambulatory Visit (HOSPITAL_COMMUNITY)
Admission: EM | Admit: 2024-11-03 | Discharge: 2024-11-08 | Disposition: A | Discharge status: Other Acute Inpatient Hospital | Payer: MEDICAID | Source: Intra-hospital | Attending: Psychiatry | Admitting: Psychiatry

## 2024-11-03 DIAGNOSIS — F411 Generalized anxiety disorder: Secondary | ICD-10-CM | POA: Insufficient documentation

## 2024-11-03 DIAGNOSIS — Z79890 Hormone replacement therapy (postmenopausal): Secondary | ICD-10-CM | POA: Insufficient documentation

## 2024-11-03 DIAGNOSIS — F1994 Other psychoactive substance use, unspecified with psychoactive substance-induced mood disorder: Secondary | ICD-10-CM

## 2024-11-03 DIAGNOSIS — G629 Polyneuropathy, unspecified: Secondary | ICD-10-CM | POA: Insufficient documentation

## 2024-11-03 DIAGNOSIS — F321 Major depressive disorder, single episode, moderate: Secondary | ICD-10-CM | POA: Insufficient documentation

## 2024-11-03 DIAGNOSIS — N390 Urinary tract infection, site not specified: Secondary | ICD-10-CM | POA: Insufficient documentation

## 2024-11-03 DIAGNOSIS — F1010 Alcohol abuse, uncomplicated: Secondary | ICD-10-CM | POA: Insufficient documentation

## 2024-11-03 DIAGNOSIS — Z5941 Food insecurity: Secondary | ICD-10-CM | POA: Insufficient documentation

## 2024-11-03 DIAGNOSIS — F1090 Alcohol use, unspecified, uncomplicated: Secondary | ICD-10-CM | POA: Insufficient documentation

## 2024-11-03 DIAGNOSIS — F17210 Nicotine dependence, cigarettes, uncomplicated: Secondary | ICD-10-CM | POA: Insufficient documentation

## 2024-11-03 DIAGNOSIS — G8929 Other chronic pain: Secondary | ICD-10-CM | POA: Insufficient documentation

## 2024-11-03 DIAGNOSIS — F1410 Cocaine abuse, uncomplicated: Secondary | ICD-10-CM | POA: Diagnosis not present

## 2024-11-03 DIAGNOSIS — Z59868 Other specified financial insecurity: Secondary | ICD-10-CM | POA: Insufficient documentation

## 2024-11-03 DIAGNOSIS — F1423 Cocaine dependence with withdrawal: Secondary | ICD-10-CM | POA: Insufficient documentation

## 2024-11-03 DIAGNOSIS — R45851 Suicidal ideations: Secondary | ICD-10-CM | POA: Insufficient documentation

## 2024-11-03 DIAGNOSIS — F32A Depression, unspecified: Secondary | ICD-10-CM | POA: Insufficient documentation

## 2024-11-03 DIAGNOSIS — I10 Essential (primary) hypertension: Secondary | ICD-10-CM | POA: Insufficient documentation

## 2024-11-03 DIAGNOSIS — Z91411 Personal history of adult psychological abuse: Secondary | ICD-10-CM | POA: Insufficient documentation

## 2024-11-03 DIAGNOSIS — G4700 Insomnia, unspecified: Secondary | ICD-10-CM | POA: Insufficient documentation

## 2024-11-03 DIAGNOSIS — Z79899 Other long term (current) drug therapy: Secondary | ICD-10-CM | POA: Insufficient documentation

## 2024-11-03 DIAGNOSIS — Z91410 Personal history of adult physical and sexual abuse: Secondary | ICD-10-CM | POA: Insufficient documentation

## 2024-11-03 DIAGNOSIS — Z791 Long term (current) use of non-steroidal anti-inflammatories (NSAID): Secondary | ICD-10-CM | POA: Insufficient documentation

## 2024-11-03 DIAGNOSIS — E039 Hypothyroidism, unspecified: Secondary | ICD-10-CM | POA: Insufficient documentation

## 2024-11-03 DIAGNOSIS — Z5982 Transportation insecurity: Secondary | ICD-10-CM | POA: Insufficient documentation

## 2024-11-03 DIAGNOSIS — F1424 Cocaine dependence with cocaine-induced mood disorder: Secondary | ICD-10-CM | POA: Insufficient documentation

## 2024-11-03 DIAGNOSIS — F39 Unspecified mood [affective] disorder: Secondary | ICD-10-CM

## 2024-11-03 LAB — COMPREHENSIVE METABOLIC PANEL WITH GFR
ALT: 21 U/L (ref 0–44)
AST: 18 U/L (ref 15–41)
Albumin: 4.3 g/dL (ref 3.5–5.0)
Alkaline Phosphatase: 126 U/L (ref 38–126)
Anion gap: 9 (ref 5–15)
BUN: 11 mg/dL (ref 6–20)
CO2: 24 mmol/L (ref 22–32)
Calcium: 9.3 mg/dL (ref 8.9–10.3)
Chloride: 104 mmol/L (ref 98–111)
Creatinine, Ser: 0.81 mg/dL (ref 0.44–1.00)
GFR, Estimated: >60 mL/min
Glucose, Bld: 89 mg/dL (ref 70–99)
Potassium: 4.6 mmol/L (ref 3.5–5.1)
Sodium: 137 mmol/L (ref 135–145)
Total Bilirubin: 0.3 mg/dL (ref 0.0–1.2)
Total Protein: 7.6 g/dL (ref 6.5–8.1)

## 2024-11-03 LAB — CBC WITH DIFFERENTIAL/PLATELET
Abs Immature Granulocytes: 0.03 K/uL (ref 0.00–0.07)
Basophils Absolute: 0.1 K/uL (ref 0.0–0.1)
Basophils Relative: 1 %
Eosinophils Absolute: 0.1 K/uL (ref 0.0–0.5)
Eosinophils Relative: 1 %
HCT: 38.4 % (ref 36.0–46.0)
Hemoglobin: 12 g/dL (ref 12.0–15.0)
Immature Granulocytes: 0 %
Lymphocytes Relative: 22 %
Lymphs Abs: 2.3 K/uL (ref 0.7–4.0)
MCH: 25 pg — ABNORMAL LOW (ref 26.0–34.0)
MCHC: 31.3 g/dL (ref 30.0–36.0)
MCV: 80 fL (ref 80.0–100.0)
Monocytes Absolute: 1 K/uL (ref 0.1–1.0)
Monocytes Relative: 10 %
Neutro Abs: 7.2 K/uL (ref 1.7–7.7)
Neutrophils Relative %: 66 %
Platelets: 323 K/uL (ref 150–400)
RBC: 4.8 MIL/uL (ref 3.87–5.11)
RDW: 20.6 % — ABNORMAL HIGH (ref 11.5–15.5)
WBC: 10.7 K/uL — ABNORMAL HIGH (ref 4.0–10.5)
nRBC: 0 % (ref 0.0–0.2)

## 2024-11-03 LAB — URINALYSIS, ROUTINE W REFLEX MICROSCOPIC
Bilirubin Urine: NEGATIVE
Glucose, UA: NEGATIVE mg/dL
Hgb urine dipstick: NEGATIVE
Ketones, ur: NEGATIVE mg/dL
Nitrite: NEGATIVE
Protein, ur: 30 mg/dL — AB
Specific Gravity, Urine: 1.026 (ref 1.005–1.030)
pH: 5 (ref 5.0–8.0)

## 2024-11-03 LAB — LIPID PANEL
Cholesterol: 204 mg/dL — ABNORMAL HIGH (ref 0–200)
HDL: 89 mg/dL
LDL Cholesterol: 99 mg/dL (ref 0–99)
Total CHOL/HDL Ratio: 2.3 ratio
Triglycerides: 81 mg/dL
VLDL: 16 mg/dL (ref 0–40)

## 2024-11-03 LAB — POCT URINE DRUG SCREEN - MANUAL ENTRY (I-SCREEN)
POC Amphetamine UR: NOT DETECTED
POC Buprenorphine (BUP): NOT DETECTED
POC Cocaine UR: POSITIVE — AB
POC Marijuana UR: NOT DETECTED
POC Methadone UR: NOT DETECTED
POC Methamphetamine UR: NOT DETECTED
POC Morphine: NOT DETECTED
POC Oxazepam (BZO): NOT DETECTED
POC Oxycodone UR: NOT DETECTED
POC Secobarbital (BAR): NOT DETECTED

## 2024-11-03 LAB — HEMOGLOBIN A1C
Hgb A1c MFr Bld: 5.3 % (ref 4.8–5.6)
Mean Plasma Glucose: 105.41 mg/dL

## 2024-11-03 LAB — TSH: TSH: 1.08 u[IU]/mL (ref 0.350–4.500)

## 2024-11-03 LAB — ETHANOL: Alcohol, Ethyl (B): <15 mg/dL

## 2024-11-03 LAB — SARS CORONAVIRUS 2 BY RT PCR: SARS Coronavirus 2 by RT PCR: NEGATIVE

## 2024-11-03 MED ORDER — LORAZEPAM 2 MG/ML IJ SOLN: 2.0000 mg | Freq: Three times a day (TID) | INTRAMUSCULAR | Status: DC | PRN

## 2024-11-03 MED ORDER — ARIPIPRAZOLE 5 MG PO TABS
5.0000 mg | ORAL_TABLET | Freq: Every day | ORAL | Status: DC
  Administered 2024-11-03 – 2024-11-07 (×5): 5 mg via ORAL
  Filled 2024-11-03 (×2): qty 1
  Filled 2024-11-03: qty 14
  Filled 2024-11-03 (×3): qty 1

## 2024-11-03 MED ORDER — HALOPERIDOL LACTATE 5 MG/ML IJ SOLN: 10.0000 mg | Freq: Three times a day (TID) | INTRAMUSCULAR | Status: DC | PRN

## 2024-11-03 MED ORDER — HALOPERIDOL 5 MG PO TABS: 5.0000 mg | ORAL_TABLET | Freq: Three times a day (TID) | ORAL | Status: DC | PRN

## 2024-11-03 MED ORDER — ALUM & MAG HYDROXIDE-SIMETH 200-200-20 MG/5ML PO SUSP: 30.0000 mL | ORAL | Status: DC | PRN

## 2024-11-03 MED ORDER — ACETAMINOPHEN 325 MG PO TABS: 650.0000 mg | ORAL_TABLET | Freq: Four times a day (QID) | ORAL | Status: DC | PRN

## 2024-11-03 MED ORDER — CLONIDINE HCL 0.1 MG PO TABS
0.1000 mg | ORAL_TABLET | Freq: Two times a day (BID) | ORAL | Status: DC | PRN
  Administered 2024-11-06: 0.1 mg via ORAL
  Filled 2024-11-03: qty 1

## 2024-11-03 MED ORDER — DIPHENHYDRAMINE HCL 50 MG/ML IJ SOLN: 50.0000 mg | Freq: Three times a day (TID) | INTRAMUSCULAR | Status: DC | PRN

## 2024-11-03 MED ORDER — MAGNESIUM HYDROXIDE 400 MG/5ML PO SUSP: 30.0000 mL | Freq: Every day | ORAL | Status: DC | PRN

## 2024-11-03 MED ORDER — MAGNESIUM HYDROXIDE 400 MG/5ML PO SUSP
30.0000 mL | Freq: Every day | ORAL | Status: DC | PRN
  Administered 2024-11-05: 30 mL via ORAL
  Filled 2024-11-03: qty 30

## 2024-11-03 MED ORDER — HYDROXYZINE HCL 25 MG PO TABS
25.0000 mg | ORAL_TABLET | Freq: Four times a day (QID) | ORAL | Status: DC | PRN
  Administered 2024-11-03 – 2024-11-06 (×3): 25 mg via ORAL
  Filled 2024-11-03 (×3): qty 1

## 2024-11-03 MED ORDER — DIPHENHYDRAMINE HCL 50 MG PO CAPS: 50.0000 mg | ORAL_CAPSULE | Freq: Three times a day (TID) | ORAL | Status: DC | PRN

## 2024-11-03 MED ORDER — NICOTINE 21 MG/24HR TD PT24
21.0000 mg | MEDICATED_PATCH | Freq: Every day | TRANSDERMAL | Status: DC
  Filled 2024-11-03: qty 1
  Filled 2024-11-03: qty 14
  Filled 2024-11-03 (×2): qty 1

## 2024-11-03 MED ORDER — MIRTAZAPINE 7.5 MG PO TABS
7.5000 mg | ORAL_TABLET | Freq: Every day | ORAL | Status: DC
  Administered 2024-11-03 – 2024-11-05 (×3): 7.5 mg via ORAL
  Filled 2024-11-03 (×3): qty 1

## 2024-11-03 MED ORDER — TRAZODONE HCL 100 MG PO TABS
100.0000 mg | ORAL_TABLET | Freq: Every evening | ORAL | Status: DC | PRN
  Administered 2024-11-03: 100 mg via ORAL
  Filled 2024-11-03 (×2): qty 1

## 2024-11-03 MED ORDER — NICOTINE 21 MG/24HR TD PT24: 21.0000 mg | MEDICATED_PATCH | Freq: Every day | TRANSDERMAL | Status: DC

## 2024-11-03 MED ORDER — HALOPERIDOL LACTATE 5 MG/ML IJ SOLN: 5.0000 mg | Freq: Three times a day (TID) | INTRAMUSCULAR | Status: DC | PRN
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
## 2024-11-03 NOTE — ED Notes (Signed)
 Patient denies pain and is resting comfortably.

## 2024-11-03 NOTE — ED Provider Notes (Signed)
 Patient arrived to [facility] unit. Patient seen. Chart reviewed.  Please, see the full [facility] Admission H&P by Dr. Kapoor.  Patinet is a 53yo F with psych history of MDD, GAD, cocaine use disorder, alcohol use disorder, whpo was admitted to [facility] for treatment of addiction and mood stabilization.  On interview patient reports daily use of cocaine and drinking to self-medicate for depression. Reports feeling depressed in context of some issues with boyfriend and inability to stay sober from drugs since covid-19 pandemic started. Reports smoking crack and drinking 6 packs of beer daily. Reports feeling depressed, unmotivated. Denies suicidal ideations, plans. Reports also moments of agitation, hyperactivity, anger; people tell her she is mean, snapping at them. So, she appears to be having mood swings. Denies homicidal thoughts, plans. Denies any hallucinations,. Does not express delusions. She is interested in addressing her mental issues and would like to start medications. She was prescribed Prozac , Zoloft , Abilify  at one point, Mirtazapine , Trazodone  in the past; never medication-compliant for long enough to get an effect from the medication. For her addiction, she is interested in [facility] 4-week program, asking for referral there.  Past psych history, social history, family history - see H&P.  Objective: Appearance:  AAF, appearing stated age,  wearing appropriate to the situation hospital clothes, with fair grooming and hygiene. Normal level of alertness and appropriate facial expression.  Attitude/Behavior: calm, cooperative, engaging with appropriate eye contact.  Motor: WNL; dyskinesias not evident. Gait appears in full range.  Speech: spontaneous, clear, coherent, normal comprehension.  Mood: up and down .  Affect: appropriately-reactive, full range.  Thought process: patient appears coherent, organized, logical, goal-directed.  Thought content: patient denies suicidal thoughts, denies  homicidal thoughts; did not express any delusions.  Thought perception: patient denies auditory and visual hallucinations. Did not appear internally stimulated.  Cognition: patient is alert and oriented in self, place, date; with intact attention and concentration.  Insight: fair.  Judgement: fair.   Assessment: 53yo F with pash h/o substance use disorder (cocaine, alcohol), depression, anxiety, admitted to [facility] for mood stabilization and substance use treatment.  Impression: Cocaine use disorder Alcohol use disorder Substance-induced mood disorder.  Plan: -continue admission to [facility]  -CIWA monitoring.  -start Abilify  5mg  po daily for mood stabilization -restart Remeron  7.5mg  PO at bedtime for sleep, depression. -restart Trazodone  100mg  PO at bedtime PRN insomnia.  -start Hydroxyzine  25mg  PO TID PRN anxiety.  Other PRNs: acetaminophen , alum & mag hydroxide-simeth, haloperidol  **AND** diphenhydrAMINE , haloperidol  lactate **AND** diphenhydrAMINE  **AND** LORazepam , haloperidol  lactate **AND** diphenhydrAMINE  **AND** LORazepam , hydrOXYzine , magnesium  hydroxide, traZODone    -f/u labwork: CBC, CMP, HbA1c, HFP, lipids, TSH, UA. BAL.  -f/u EKG  Dispo: SW to refer the patient for residential addiction treatment program.

## 2024-11-03 NOTE — ED Notes (Signed)
 Pt admitted to obs to be transferred to [facility] requesting detox from cocaine. Pt pleasant. Nurse to nurse report given to Tinnie, RN @[facility] . Pt denies SI/HI/AVH but endorse depression. Will monitor for safety.

## 2024-11-03 NOTE — ED Provider Notes (Signed)
 Facility Based Crisis Admission H&P  Date: 11/03/2024 Patient Name: Marie Atkins MRN: [id number] Chief Complaint: Feeling depressed lately  Diagnoses:  Final diagnoses:  Cocaine use disorder (HCC)  Current moderate episode of major depressive disorder, unspecified whether recurrent [facility])    HPI: Marie Atkins is a 53 year old female with a history of MDD, GAD, cocaine use disorder, tobacco use, alcohol use disorder that presented to the urgent care for rehab resources in the setting of cocaine use and worsening depression. Patient reported that I have been feeling depressed lately, unable to state any specific reasons but stated that  my boyfriend keeps getting back to the stuff, reported that she has been using cocaine every day for the past few months, unable to scrape the duration.  She reported that I used a lot, yesterday, smoking it, and also drinking alcohol, 6 packs of beers every day over the past month.  She also reported smoking 1 pack of cigarettes every day and denied using any other drugs. She endorsed passive suicidal ideations without a plan and reported that she tried to attempt suicide and year ago by slitting my wrist.  She denied any homicidal ideations or auditory or visual hallucinations. Reportedly had relationship issues, missing her children, housing challenges and unemployment have been under stressors leading her to cocaine use reported that her grandchildren, going to church having a positive coping skills for her. She reported that she was 15 years sober prior to COVID with unable to stay sober despite completing 2 rehab programs in this year, March and June. Reported that she does not have an outpatient psychiatrist however was taking Prozac  intermittently I forget to take it , and stated that it helped a bit .  She reported poor sleep and poor appetite. She was emotionally labile in conversation stating I feel like I am not good enough.  Reported that she  would like to go to [facility] for treatment.  She was amenable to being at [facility] for detox currently.  PHQ 2-9:  Flowsheet Row ED from 11/03/2024 in [facility] ED from 07/15/2024 in [facility] Office Visit from 06/16/2020 in [facility] : [facility] [place]  Thoughts that you would be better off dead, or of hurting yourself in some way More than half the days Nearly every day Several days  PHQ-9 Total Score 23 27 17     Flowsheet Row ED from 11/03/2024 in [facility] ED from 07/15/2024 in [facility] ED from 07/14/2024 in [facility] Emergency Department at [facility]  C-SSRS RISK CATEGORY No Risk High Risk High Risk      Total Time spent with patient: 45 minutes  Musculoskeletal  Strength & Muscle Tone: within normal limits Gait & Station: normal Patient leans: N/A  Psychiatric Specialty Exam  Presentation General Appearance:  Appropriate for Environment; Casual  Eye Contact: Fair  Speech: Clear and Coherent  Speech Volume: Normal  Handedness: Right   Mood and Affect  Mood: Depressed  Affect: Tearful   Thought Process  Thought Processes: Linear  Descriptions of Associations:Intact  Orientation:Full (Time, Place and Person)  Thought Content:Logical  Diagnosis of Schizophrenia or Schizoaffective disorder in past: No data recorded  Hallucinations:Hallucinations: None  Ideas of Reference:None  Suicidal Thoughts:Suicidal Thoughts: Yes, Passive SI Passive Intent and/or Plan: Without Intent; Without Means to Carry Out  Homicidal Thoughts:Homicidal Thoughts: No   Sensorium  Memory: Immediate  Good  Judgment: Fair  Insight: Fair   Chartered Certified Accountant: Good  Attention Span: Fair  Recall: Marie Atkins of Knowledge: Fair  Language: Fair; Good   Psychomotor Activity   Psychomotor Activity:Psychomotor Activity: Normal   Assets  Assets: Communication Skills; Desire for Improvement; Financial Resources/Insurance; Housing   Sleep  Sleep:Sleep: Fair   Nutritional Assessment (For OBS and [facility] admissions only) Has the patient had a weight loss or gain of 10 pounds or more in the last 3 months?: No Has the patient had a decrease in food intake/or appetite?: No Does the patient have dental problems?: No Does the patient have eating habits or behaviors that may be indicators of an eating disorder including binging or inducing vomiting?: No Has the patient recently lost weight without trying?: 0 Has the patient been eating poorly because of a decreased appetite?: 0 Malnutrition Screening Tool Score: 0    Physical Exam Vitals and nursing note reviewed.  Constitutional:      General: She is not in acute distress.    Appearance: She is well-developed.  HENT:     Head: Normocephalic and atraumatic.  Eyes:     Conjunctiva/sclera: Conjunctivae normal.  Cardiovascular:     Rate and Rhythm: Normal rate and regular rhythm.     Heart sounds: No murmur heard. Pulmonary:     Effort: Pulmonary effort is normal. No respiratory distress.     Breath sounds: Normal breath sounds.  Abdominal:     Palpations: Abdomen is soft.     Tenderness: There is no abdominal tenderness.  Musculoskeletal:        General: No swelling.     Cervical back: Neck supple.  Skin:    General: Skin is warm and dry.     Capillary Refill: Capillary refill takes less than 2 seconds.  Neurological:     Mental Status: She is alert.  Psychiatric:        Mood and Affect: Mood normal.    Review of Systems  Constitutional:  Negative for chills, fever, malaise/fatigue and weight loss.  HENT:  Negative for ear pain, hearing loss and tinnitus.   Eyes:  Negative for blurred vision, double vision and photophobia.  Respiratory:  Negative for cough and hemoptysis.   Cardiovascular:   Negative for chest pain, palpitations and orthopnea.  Gastrointestinal:  Negative for abdominal pain, heartburn, nausea and vomiting.  Genitourinary:  Negative for dysuria, frequency and urgency.  Musculoskeletal:  Negative for myalgias and neck pain.  Skin:  Negative for rash.  Neurological:  Negative for dizziness, tingling, tremors, weakness and headaches.  Endo/Heme/Allergies:  Negative for environmental allergies. Does not bruise/bleed easily.  Psychiatric/Behavioral:  Positive for depression, substance abuse and suicidal ideas. The patient has insomnia.     Blood pressure 132/77, pulse 77, temperature 98 F (36.7 C), temperature source Oral, resp. rate 20, SpO2 99%. There is no height or weight on file to calculate BMI.  Past Psychiatric History: Ms Arista is a 53 year old patient with    Is the patient at risk to self? Yes  Has the patient been a risk to self in the past 6 months? Yes .    Has the patient been a risk to self within the distant past? Yes   Is the patient a risk to others? No   Has the patient been a risk to others in the past 6 months? No   Has the patient been a risk to others within the distant past?  No   Past Medical History:  Past Medical History:  Diagnosis Date   Cocaine abuse (HCC)    Depression    HTN (hypertension)    Hypothyroid    Iron deficiency anemia     Family History: N/A Social History: Patient lives with her boyfriend.  Last Labs:  Admission on 07/15/2024, Discharged on 07/17/2024  Component Date Value Ref Range Status   Preg Test, Ur 07/15/2024 Negative  Negative Final   TSH 07/15/2024 0.727  0.350 - 4.500 uIU/mL Final   Comment: Performed by a 3rd Generation assay with a functional sensitivity of <=0.01 uIU/mL. Performed at [facility], [address], [place], [place] [address]   Admission on 07/14/2024, Discharged on 07/15/2024  Component Date Value Ref Range Status   Sodium 07/14/2024 139  135 - 145 mmol/L Final    Potassium 07/14/2024 4.3  3.5 - 5.1 mmol/L Final   Chloride 07/14/2024 105  98 - 111 mmol/L Final   CO2 07/14/2024 22  22 - 32 mmol/L Final   Glucose, Bld 07/14/2024 72  70 - 99 mg/dL Final   Glucose reference range applies only to samples taken after fasting for at least 8 hours.   BUN 07/14/2024 10  6 - 20 mg/dL Final   Creatinine, Ser 07/14/2024 0.64  0.44 - 1.00 mg/dL Final   Calcium 90/96/7974 9.6  8.9 - 10.3 mg/dL Final   Total Protein 90/96/7974 7.6  6.5 - 8.1 g/dL Final   Albumin 90/96/7974 4.1  3.5 - 5.0 g/dL Final   AST 90/96/7974 29  15 - 41 U/L Final   ALT 07/14/2024 25  0 - 44 U/L Final   Alkaline Phosphatase 07/14/2024 119  38 - 126 U/L Final   Total Bilirubin 07/14/2024 0.4  0.0 - 1.2 mg/dL Final   GFR, Estimated 07/14/2024 >60  >60 mL/min Final   Comment: (NOTE) Calculated using the CKD-EPI Creatinine Equation (2021)    Anion gap 07/14/2024 13  5 - 15 Final   Performed at [facility], [address], [place], [place] [address]   Alcohol, Ethyl (B) 07/14/2024 <15  <15 mg/dL Final   Comment: (NOTE) For medical purposes only. Performed at [facility], [address] [place], [place] [address]    WBC 07/14/2024 13.7 (H)  4.0 - 10.5 K/uL Final   RBC 07/14/2024 4.32  3.87 - 5.11 MIL/uL Final   Hemoglobin 07/14/2024 10.3 (L)  12.0 - 15.0 g/dL Final   HCT 90/96/7974 33.7 (L)  36.0 - 46.0 % Final   MCV 07/14/2024 78.0 (L)  80.0 - 100.0 fL Final   MCH 07/14/2024 23.8 (L)  26.0 - 34.0 pg Final   MCHC 07/14/2024 30.6  30.0 - 36.0 g/dL Final   RDW 90/96/7974 19.3 (H)  11.5 - 15.5 % Final   Platelets 07/14/2024 380  150 - 400 K/uL Final   nRBC 07/14/2024 0.0  0.0 - 0.2 % Final   Performed at [facility], [address] [place], [place] [address]   Opiates 07/14/2024 NEGATIVE  NEGATIVE Final   Cocaine 07/14/2024 POSITIVE (A)  NEGATIVE Final   Benzodiazepines 07/14/2024 NEGATIVE  NEGATIVE Final   Amphetamines  07/14/2024 NEGATIVE  NEGATIVE Final   Tetrahydrocannabinol 07/14/2024 NEGATIVE  NEGATIVE Final   Barbiturates 07/14/2024 NEGATIVE  NEGATIVE Final   Methadone Scn, Ur 07/14/2024 NEGATIVE  NEGATIVE Final   Fentanyl  07/14/2024 NEGATIVE  NEGATIVE Final   Comment: (NOTE) Drug screen is for Medical Purposes  only. Positive results are preliminary only. If confirmation is needed, notify lab within 5 days.  Drug Class                 Cutoff (ng/mL) Amphetamine and metabolites 1000 Barbiturate and metabolites 200 Benzodiazepine              200 Opiates and metabolites     300 Cocaine and metabolites     300 THC                         50 Fentanyl                     5 Methadone                   300  Trazodone  is metabolized in vivo to several metabolites,  including pharmacologically active m-CPP, which is excreted in the  urine.  Immunoassay screens for amphetamines and MDMA have potential  cross-reactivity with these compounds and may provide false positive  result.  Performed at [facility], [address] [place], [place] [address]    Acetaminophen  (Tylenol ), Serum 07/14/2024 <10 (L)  10 - 30 ug/mL Final   Comment: (NOTE) Toxic concentrations can be more effectively related to post dose interval; > 200, > 100, and > 50 ug/mL serum concentrations correspond to toxic concentrations at 4, 8, and 12 hours post dose, respectively.  Performed at [facility], [address], [place], [place] [address]     Allergies: Ace inhibitors and Lisinopril  Medications:  PTA Medications  Medication Sig   ferrous sulfate  325 (65 FE) MG tablet Take 1 tablet (325 mg total) by mouth daily.   Vitamin D , Ergocalciferol , (DRISDOL ) 1.25 MG (50000 UNIT) CAPS capsule Take 1 capsule (50,000 Units total) by mouth every 7 (seven) days.   fluticasone  (FLONASE ) 50 MCG/ACT nasal spray Place 2 sprays into both nostrils daily. (Patient taking differently: Place 2  sprays into both nostrils daily as needed for allergies.)   cetirizine  (ZYRTEC  ALLERGY) 10 MG tablet Take 1 tablet (10 mg total) by mouth daily.   pantoprazole  (PROTONIX ) 40 MG tablet Take 1 tablet (40 mg total) by mouth daily.   meloxicam  (MOBIC ) 15 MG tablet Take 15 mg by mouth daily.   losartan  (COZAAR ) 100 MG tablet Take 1 tablet (100 mg total) by mouth daily.   hydrOXYzine  (ATARAX ) 25 MG tablet Take 1 tablet (25 mg total) by mouth every 6 (six) hours as needed for anxiety.   sertraline  (ZOLOFT ) 50 MG tablet Take 1 tablet (50 mg total) by mouth daily.   levothyroxine  (SYNTHROID ) 50 MCG tablet Take 1 tablet (50 mcg total) by mouth daily at 6 (six) AM.    Long Term Goals: Improvement in symptoms so as ready for discharge  Short Term Goals: Patient will verbalize feelings in meetings with treatment team members., Patient will attend at least of 50% of the groups daily., Pt will complete the PHQ9 on admission, day 3 and discharge., Patient will participate in completing the [place] Suicide Severity Rating Scale, Patient will score a low risk of violence for 24 hours prior to discharge, and Patient will take medications as prescribed daily.  Medical Decision Making  Ms. Sliter is a 53 year old female who with a history of MDD, GAD, cocaine use disorder, alcohol use disorder, tobacco use that presented to the urgent care for worsening depression in the setting of cocaine and alcohol  use and psychosocial stressors that includes financial constraints, relationship issues, interpersonal conflicts with her boyfriend and family.  She has poor coping skills and limited insight into her condition and reports back to using cocaine during stress.  Her sleep and appetite have been affected due to the ongoing drug and alcohol use.  She does demonstrate insight into her condition however lacks coping skills. She is not actively homicidal or suicidal or having any AVH. She does have passive SI which are likely due  to to the above mentioned stressors. Positive reinforcements include her grandkids, church.  She has had admissions and rehab placements in the past and would currently benefit from detox and rehab placement, in addition to OP therapy resources prior to discharge. Will admit her to [facility]    Recommendations  Based on my evaluation the patient does not appear to have an emergency medical condition. - Admit to [facility] - Monitor for any withdrawals  Ceejay Kegley, MD 11/03/2024  2:14 PM

## 2024-11-03 NOTE — Care Management (Signed)
 [facility] Care Management...  Writer met with the client to discuss discharge planning.  Writer informed the client the typical stay here is 3-5 days.  Client reports her primary drug(s) of choice are crack cocaine and alcohol.  Client reports she's interested in residential treatment.  Client reports she would prefer a working program.  Clinical Research Associate informed the client the Clinical Research Associate will explore additional resources for working programs.  Client reports she would like a referral to be to [facility] as well.  Client reports he boyfriend is drug/user/dealer and she would not like to return home and or make contact with family.  Client reports feeling guilty about using the children's money to buy crack cocaine and alcohol.  Client reports she spends approximately $100 per day on cocaine.  Client reports at some point she may reach out to her family but for now she's fine with them not knowing where I am.    Writer faxed a referral to [facility].

## 2024-11-03 NOTE — ED Notes (Signed)
 Pt observed at the beginning of shift , awake, alert and oriented, denies SI, interacting with peers appropriately.  Safety maintained.

## 2024-11-03 NOTE — Progress Notes (Signed)
" °   11/03/24 1241  [facility] Triage Screening (Walk-ins at [facility] only)  How Did You Hear About Us ? Self  What Is the Reason for Your Visit/Call Today? Patient is a 53 year old female that presents as a voluntary walk in to [facility] requesting assistance with ongoing cocaine use. Patient denies any SI, HI or AVH. Patient states she is looking for a long term treatment facility and is hoping she could go to [facility] for their 28 day program. Patient has a PMHx significant for MDD and cocaine use with patient reporting that she has been receiving OP services from [facility] who assists with medication management. Patient reports current compliance although states she feels her medications are not working as indicated as she continues to experience frequent mood swings and feelings of hopelessness. Patient per chart review was last seen on 07/14/2024 when she presented with similar symptoms. When asked in reference to current SA use patient states she has been using crack cocaine in large amounts daily for the last month (1 to 3 grams) patient also reports sporadic alcohol use although is vague in reference to use patterns. Patient states her last cocaine use was in the last 24 hours when patient reports she used, a lot of it. Patient denies any current withdrawals.  How Long Has This Been Causing You Problems? > than 6 months  Have You Recently Had Any Thoughts About Hurting Yourself? No  Are You Planning to Commit Suicide/Harm Yourself At This time? No  Have you Recently Had Thoughts About Hurting Someone Sherral? No  Are You Planning To Harm Someone At This Time? No  Physical Abuse Denies  Verbal Abuse Denies  Sexual Abuse Denies  Exploitation of patient/patient's resources Denies  Self-Neglect Denies  Possible abuse reported to: Other (Comment) (NA)  Are you currently experiencing any auditory, visual or other hallucinations? No  Have You Used Any Alcohol or Drugs in the Past 24 Hours? Yes  What Did You Use  and How Much? Patient states she used an unknown amount of cocaine in the last 24 hours  Do you have any current medical co-morbidities that require immediate attention? No  Clinician description of patient physical appearance/behavior: Patient renders limited history  What Do You Feel Would Help You the Most Today? Alcohol or Drug Use Treatment  If access to [facility] was not available, would you have sought care in the Emergency Department? No  Determination of Need Routine (7 days)  Options For Referral Other: Comment (to be determined)    "

## 2024-11-03 NOTE — ED Notes (Signed)
 Pt presents as somewhat depressed, but calm and cooperative. Pt says she came to facility for substance use and depression. Verbal contract for safety provided. Pt oriented to unit, questions denied.

## 2024-11-04 DIAGNOSIS — F1410 Cocaine abuse, uncomplicated: Secondary | ICD-10-CM | POA: Diagnosis not present

## 2024-11-04 DIAGNOSIS — F411 Generalized anxiety disorder: Secondary | ICD-10-CM | POA: Diagnosis not present

## 2024-11-04 DIAGNOSIS — F1090 Alcohol use, unspecified, uncomplicated: Secondary | ICD-10-CM | POA: Diagnosis not present

## 2024-11-04 DIAGNOSIS — F321 Major depressive disorder, single episode, moderate: Secondary | ICD-10-CM | POA: Diagnosis not present

## 2024-11-04 LAB — LIPID PANEL
Cholesterol: 187 mg/dL (ref 0–200)
HDL: 84 mg/dL
LDL Cholesterol: 89 mg/dL (ref 0–99)
Total CHOL/HDL Ratio: 2.2 ratio
Triglycerides: 68 mg/dL
VLDL: 14 mg/dL (ref 0–40)

## 2024-11-04 LAB — COMPREHENSIVE METABOLIC PANEL WITH GFR
ALT: 20 U/L (ref 0–44)
AST: 22 U/L (ref 15–41)
Albumin: 4 g/dL (ref 3.5–5.0)
Alkaline Phosphatase: 112 U/L (ref 38–126)
Anion gap: 11 (ref 5–15)
BUN: 15 mg/dL (ref 6–20)
CO2: 24 mmol/L (ref 22–32)
Calcium: 9.3 mg/dL (ref 8.9–10.3)
Chloride: 103 mmol/L (ref 98–111)
Creatinine, Ser: 0.94 mg/dL (ref 0.44–1.00)
GFR, Estimated: >60 mL/min
Glucose, Bld: 106 mg/dL — ABNORMAL HIGH (ref 70–99)
Potassium: 4.2 mmol/L (ref 3.5–5.1)
Sodium: 138 mmol/L (ref 135–145)
Total Bilirubin: 0.3 mg/dL (ref 0.0–1.2)
Total Protein: 7.4 g/dL (ref 6.5–8.1)

## 2024-11-04 LAB — CBC WITH DIFFERENTIAL/PLATELET
Abs Immature Granulocytes: 0.03 K/uL (ref 0.00–0.07)
Basophils Absolute: 0.1 K/uL (ref 0.0–0.1)
Basophils Relative: 1 %
Eosinophils Absolute: 0.1 K/uL (ref 0.0–0.5)
Eosinophils Relative: 1 %
HCT: 36.2 % (ref 36.0–46.0)
Hemoglobin: 11.2 g/dL — ABNORMAL LOW (ref 12.0–15.0)
Immature Granulocytes: 0 %
Lymphocytes Relative: 32 %
Lymphs Abs: 3.2 K/uL (ref 0.7–4.0)
MCH: 24.8 pg — ABNORMAL LOW (ref 26.0–34.0)
MCHC: 30.9 g/dL (ref 30.0–36.0)
MCV: 80.3 fL (ref 80.0–100.0)
Monocytes Absolute: 0.8 K/uL (ref 0.1–1.0)
Monocytes Relative: 8 %
Neutro Abs: 5.8 K/uL (ref 1.7–7.7)
Neutrophils Relative %: 58 %
Platelets: 311 K/uL (ref 150–400)
RBC: 4.51 MIL/uL (ref 3.87–5.11)
RDW: 20.4 % — ABNORMAL HIGH (ref 11.5–15.5)
WBC: 10 K/uL (ref 4.0–10.5)
nRBC: 0 % (ref 0.0–0.2)

## 2024-11-04 LAB — ETHANOL: Alcohol, Ethyl (B): <15 mg/dL

## 2024-11-04 LAB — HEMOGLOBIN A1C
Hgb A1c MFr Bld: 5.2 % (ref 4.8–5.6)
Mean Plasma Glucose: 102.54 mg/dL

## 2024-11-04 LAB — TSH: TSH: 1.06 u[IU]/mL (ref 0.350–4.500)

## 2024-11-04 MED ORDER — NYSTATIN-TRIAMCINOLONE 100000-0.1 UNIT/GM-% EX CREA
1.0000 | TOPICAL_CREAM | Freq: Two times a day (BID) | CUTANEOUS | Status: DC
  Administered 2024-11-04 – 2024-11-05 (×3): 1 via TOPICAL
  Filled 2024-11-04: qty 15

## 2024-11-04 MED ORDER — GABAPENTIN 300 MG PO CAPS
300.0000 mg | ORAL_CAPSULE | Freq: Three times a day (TID) | ORAL | Status: DC
  Administered 2024-11-04 – 2024-11-07 (×11): 300 mg via ORAL
  Filled 2024-11-04 (×6): qty 1
  Filled 2024-11-04: qty 42
  Filled 2024-11-04 (×6): qty 1

## 2024-11-04 MED ORDER — NYSTATIN-TRIAMCINOLONE 100000-0.1 UNIT/GM-% EX CREA
1.0000 | TOPICAL_CREAM | Freq: Two times a day (BID) | CUTANEOUS | Status: DC
  Filled 2024-11-04 (×2): qty 15

## 2024-11-04 MED ORDER — NAPROXEN 375 MG PO TABS
375.0000 mg | ORAL_TABLET | Freq: Three times a day (TID) | ORAL | Status: DC
  Administered 2024-11-04 – 2024-11-07 (×10): 375 mg via ORAL
  Filled 2024-11-04 (×6): qty 1
  Filled 2024-11-04: qty 28
  Filled 2024-11-04 (×3): qty 1

## 2024-11-04 MED ORDER — AMLODIPINE BESYLATE 10 MG PO TABS
10.0000 mg | ORAL_TABLET | Freq: Every day | ORAL | Status: DC
  Administered 2024-11-04 – 2024-11-06 (×3): 10 mg via ORAL
  Filled 2024-11-04 (×4): qty 1

## 2024-11-04 MED ORDER — NITROFURANTOIN MONOHYD MACRO 100 MG PO CAPS
100.0000 mg | ORAL_CAPSULE | Freq: Two times a day (BID) | ORAL | Status: AC
  Administered 2024-11-04 – 2024-11-06 (×5): 100 mg via ORAL
  Filled 2024-11-04 (×5): qty 1

## 2024-11-04 MED ORDER — LEVOTHYROXINE SODIUM 25 MCG PO TABS
50.0000 ug | ORAL_TABLET | Freq: Every day | ORAL | Status: DC
  Administered 2024-11-04 – 2024-11-07 (×4): 50 ug via ORAL
  Filled 2024-11-04 (×3): qty 2
  Filled 2024-11-04: qty 14
  Filled 2024-11-04: qty 2

## 2024-11-04 MED ORDER — LOSARTAN POTASSIUM 50 MG PO TABS
100.0000 mg | ORAL_TABLET | Freq: Every day | ORAL | Status: DC
  Administered 2024-11-04 – 2024-11-07 (×4): 100 mg via ORAL
  Filled 2024-11-04 (×4): qty 2
  Filled 2024-11-04: qty 28
  Filled 2024-11-04: qty 2

## 2024-11-04 NOTE — ED Notes (Signed)
 Patient is in the bedroom calm and sleeping, NAD

## 2024-11-04 NOTE — ED Notes (Signed)
 Patient had various labs in chart that had not been checked off that had been completed on 11/03/2024 - writer did not obtain labs but marked off completion in chart as labs had already resulted.

## 2024-11-04 NOTE — Group Note (Signed)
 Group Topic: Relapse and Recovery  Group Date: 11/04/2024 Start Time: 1100 End Time: 1155 Facilitators: Gerome Jolly, NT  Department: [facility]  Number of Participants: 3  Group Focus: coping skills, managing urges, relapse prevention, and substance abuse education Treatment Modality:  Cognitive Behavioral Therapy and Psychoeducation Interventions utilized were exploration and identifying strategies to manage urges Purpose: explore maladaptive thinking, relapse prevention strategies, and trigger / craving management  Name: Marie Atkins Date of Birth: 1970-11-27  MR: [id number]    Level of Participation: Pt did not attend group Quality of Participation: na Interactions with others: na Mood/Affect: na Triggers (if applicable): na Cognition: na Progress: na Response: Pt did not attend group Plan: follow-up needed  Patients Problems:  Patient Active Problem List   Diagnosis Date Noted   GAD (generalized anxiety disorder) 07/15/2024   Cocaine use 07/15/2024   Suicidal ideation 07/15/2024   Vitamin D  deficiency 12/15/2023   Severe recurrent major depressive disorder with psychotic features (HCC) 08/10/2023   Hypothyroidism 03/27/2023   Uncomplicated alcohol dependence (HCC) 03/03/2023   Cocaine use disorder (HCC) 03/03/2023   Tobacco use disorder 03/03/2023   MDD (major depressive disorder), recurrent, severe, with psychosis (HCC) 03/02/2023   Severe recurrent major depression without psychotic features (HCC) 06/24/2020   Menorrhagia 06/16/2020   Iron deficiency anemia due to chronic blood loss 06/16/2020   Proteinuria 06/16/2020   Depression 06/16/2020   Tobacco dependence due to cigarettes 06/16/2020   ASCUS with positive high risk HPV cervical 06/16/2020   Amphetamine and psychostimulant-induced psychosis with delusions (HCC)    Benign essential HTN 02/16/2016   History of bilateral tubal ligation 08/27/2014

## 2024-11-04 NOTE — Group Note (Signed)
 Group Topic: Identity and Relationships  Group Date: 11/04/2024 Start Time: 1115 End Time: 1200 Facilitators: Alyse Leilani LABOR, NT  Department: [facility]  Number of Participants: 9  Group Focus: clarity of thought and co-dependency Treatment Modality:  Psychoeducation Interventions utilized were group exercise, patient education, and problem solving Purpose: enhance coping skills, improve communication skills, and regain self-worth  Name: Shiori Adcox Date of Birth: [birth year]  MR: [id number]    Pt did not come to group, She want's to sleep Patients Problems:  Patient Active Problem List   Diagnosis Date Noted   GAD (generalized anxiety disorder) 07/15/2024   Cocaine use 07/15/2024   Suicidal ideation 07/15/2024   Vitamin D  deficiency 12/15/2023   Severe recurrent major depressive disorder with psychotic features (HCC) 08/10/2023   Hypothyroidism 03/27/2023   Uncomplicated alcohol dependence (HCC) 03/03/2023   Cocaine use disorder (HCC) 03/03/2023   Tobacco use disorder 03/03/2023   MDD (major depressive disorder), recurrent, severe, with psychosis (HCC) 03/02/2023   Severe recurrent major depression without psychotic features (HCC) 06/24/2020   Menorrhagia 06/16/2020   Iron deficiency anemia due to chronic blood loss 06/16/2020   Proteinuria 06/16/2020   Depression 06/16/2020   Tobacco dependence due to cigarettes 06/16/2020   ASCUS with positive high risk HPV cervical 06/16/2020   Amphetamine and psychostimulant-induced psychosis with delusions (HCC)    Benign essential HTN 02/16/2016   History of bilateral tubal ligation 08/27/2014

## 2024-11-04 NOTE — ED Notes (Signed)
 Pt observed to sleep quietly during rounds.  No signs for distress or discomfort.  Safety maintained.

## 2024-11-04 NOTE — Group Note (Signed)
 Group Topic: Wellness  Group Date: 11/04/2024 Start Time: 2030 End Time: 2100 Facilitators: Anice Benton LABOR, NT  Department: [facility]  Number of Participants: 5  Group Focus: check in Treatment Modality:  Patient-Centered Therapy Interventions utilized were support Purpose: express feelings  Name: Marie Atkins Date of Birth: 02-Mar-1971  MR: [id number]    Level of Participation: Did Not Attend  Quality of Participation: N/A Interactions with others: N/A Mood/Affect: N/A Triggers (if applicable): N/A Cognition: N/A Progress: None Response: N/A Plan: patient will be encouraged to attend groups   Patients Problems:  Patient Active Problem List   Diagnosis Date Noted   GAD (generalized anxiety disorder) 07/15/2024   Cocaine use 07/15/2024   Suicidal ideation 07/15/2024   Vitamin D  deficiency 12/15/2023   Severe recurrent major depressive disorder with psychotic features (HCC) 08/10/2023   Hypothyroidism 03/27/2023   Uncomplicated alcohol dependence (HCC) 03/03/2023   Cocaine use disorder (HCC) 03/03/2023   Tobacco use disorder 03/03/2023   MDD (major depressive disorder), recurrent, severe, with psychosis (HCC) 03/02/2023   Severe recurrent major depression without psychotic features (HCC) 06/24/2020   Menorrhagia 06/16/2020   Iron deficiency anemia due to chronic blood loss 06/16/2020   Proteinuria 06/16/2020   Depression 06/16/2020   Tobacco dependence due to cigarettes 06/16/2020   ASCUS with positive high risk HPV cervical 06/16/2020   Amphetamine and psychostimulant-induced psychosis with delusions (HCC)    Benign essential HTN 02/16/2016   History of bilateral tubal ligation 08/27/2014

## 2024-11-04 NOTE — Group Note (Signed)
 Group Topic: Recovery Basics  Group Date: 11/04/2024 Start Time: 1200 End Time: 1230 Facilitators: Khamarion Bjelland, Zane HERO, RN  Department: [facility]  Number of Participants: 1  Group Focus: nursing group Treatment Modality:  Individual Therapy Interventions utilized were patient education Purpose: increase insight  Name: Marie Atkins Date of Birth: 1971-07-03  MR: [id number]    Level of Participation: moderate Quality of Participation: cooperative Interactions with others: gave feedback Mood/Affect: appropriate Triggers (if applicable): None identified at this time Cognition: coherent/clear and logical Progress: Gaining insight Response: Patient voiced understanding of medications and treatment plan. Voices understanding of who to speak with in case further questions arise. Plan: patient will be encouraged to continue to attend groups/programming on the unit  Patients Problems:  Patient Active Problem List   Diagnosis Date Noted   GAD (generalized anxiety disorder) 07/15/2024   Cocaine use 07/15/2024   Suicidal ideation 07/15/2024   Vitamin D  deficiency 12/15/2023   Severe recurrent major depressive disorder with psychotic features (HCC) 08/10/2023   Hypothyroidism 03/27/2023   Uncomplicated alcohol dependence (HCC) 03/03/2023   Cocaine use disorder (HCC) 03/03/2023   Tobacco use disorder 03/03/2023   MDD (major depressive disorder), recurrent, severe, with psychosis (HCC) 03/02/2023   Severe recurrent major depression without psychotic features (HCC) 06/24/2020   Menorrhagia 06/16/2020   Iron deficiency anemia due to chronic blood loss 06/16/2020   Proteinuria 06/16/2020   Depression 06/16/2020   Tobacco dependence due to cigarettes 06/16/2020   ASCUS with positive high risk HPV cervical 06/16/2020   Amphetamine and psychostimulant-induced psychosis with delusions (HCC)    Benign essential HTN 02/16/2016   History of bilateral tubal ligation  08/27/2014

## 2024-11-04 NOTE — ED Notes (Signed)
 Patient is in the bedroom calm and sleeping. NAD.  Environment secured per policy. Respirations even and unlabored. Will monitor for safety.

## 2024-11-04 NOTE — Group Note (Signed)
 Group Topic: Relapse and Recovery  Group Date: 11/04/2024 Start Time: 8069 End Time: 2000 Facilitators: Verdon Jacqualyn BRAVO, NT  Department: [facility]  Number of Participants: 9  Group Focus: diagnosis education Treatment Modality:  Psychoeducation Interventions utilized were group exercise Purpose: express feelings  Name: Marie Atkins Date of Birth: 1970/11/18  MR: [id number]    Level of Participation: active Quality of Participation: cooperative Interactions with others: n/a Mood/Affect: appropriate Triggers (if applicable): n/a Cognition: coherent/clear Progress: Moderate Response: n/a Plan: follow-up needed  Patients Problems:  Patient Active Problem List   Diagnosis Date Noted   GAD (generalized anxiety disorder) 07/15/2024   Cocaine use 07/15/2024   Suicidal ideation 07/15/2024   Vitamin D  deficiency 12/15/2023   Severe recurrent major depressive disorder with psychotic features (HCC) 08/10/2023   Hypothyroidism 03/27/2023   Uncomplicated alcohol dependence (HCC) 03/03/2023   Cocaine use disorder (HCC) 03/03/2023   Tobacco use disorder 03/03/2023   MDD (major depressive disorder), recurrent, severe, with psychosis (HCC) 03/02/2023   Severe recurrent major depression without psychotic features (HCC) 06/24/2020   Menorrhagia 06/16/2020   Iron deficiency anemia due to chronic blood loss 06/16/2020   Proteinuria 06/16/2020   Depression 06/16/2020   Tobacco dependence due to cigarettes 06/16/2020   ASCUS with positive high risk HPV cervical 06/16/2020   Amphetamine and psychostimulant-induced psychosis with delusions (HCC)    Benign essential HTN 02/16/2016   History of bilateral tubal ligation 08/27/2014

## 2024-11-04 NOTE — ED Provider Notes (Signed)
 Behavioral Health Progress Note  Date and Time: 11/04/2024 12:48 PM Name: Marie Atkins MRN:  [id number]  Patient is a 53yo F with psych history of MDD, GAD, cocaine use disorder, alcohol use disorder, whpo was admitted to [facility] for treatment of addiction and mood stabilization.   Interval History Patient was seen today for re-evaluation.  Nursing reports no events overnight. The patient has no issues with performing ADLs.  Patient has been medication compliant.    Patient was seen and interviewed by attending psychiatrist. Chart reviewed. Patient discussed during treatment team rounds.  Subjective:  On assessment patient reports feeling depressed. She reports depressed mood, denies suicidal thoughts, plans. She sleeps a lot today (cocaine withdrawal?). She reports no side effects from initiated medications (Abilify , Remeron ). She denies auditory or visual hallucinations, denies feeling paranoid, does not express any delusions. No thoughts or plans of hurting others. No current symptoms of acute alcohol withdrawal. She reports some perineal discomfort and burning. UA shows some signs of UTI - abx started, patient agreed.  Labs: reviewed results from 12/24: CBC mostly wnl (mild anemia); CMP - wnl; UA - signs of possible infection; BAL < 15, UDS pos for cocaine.SARS - neg. TSH - wnl.  EKG - NSR, Qtc .   Diagnosis:  Final diagnoses:  None    Total Time spent with patient: 30 minutes  Past psych history, social history, family history - see H&P.   Sleep: Fair  Appetite:  Fair  Current Medications:  Current Facility-Administered Medications  Medication Dose Route Frequency Provider Last Rate Last Admin   acetaminophen  (TYLENOL ) tablet 650 mg  650 mg Oral Q6H PRN Kapoor, Sahil, MD       alum & mag hydroxide-simeth (MAALOX/MYLANTA) 200-200-20 MG/5ML suspension 30 mL  30 mL Oral Q4H PRN Kapoor, Sahil, MD       amLODipine  (NORVASC ) tablet 10 mg  10 mg Oral Daily Madison Direnzo, MD        ARIPiprazole  (ABILIFY ) tablet 5 mg  5 mg Oral Daily Tasheba Henson, MD   5 mg at 11/04/24 1015   cloNIDine  (CATAPRES ) tablet 0.1 mg  0.1 mg Oral Q12H PRN Adline Kirshenbaum, MD       haloperidol  (HALDOL ) tablet 5 mg  5 mg Oral TID PRN Kapoor, Sahil, MD       And   diphenhydrAMINE  (BENADRYL ) capsule 50 mg  50 mg Oral TID PRN Kapoor, Sahil, MD       haloperidol  lactate (HALDOL ) injection 5 mg  5 mg Intramuscular TID PRN Kapoor, Sahil, MD       And   diphenhydrAMINE  (BENADRYL ) injection 50 mg  50 mg Intramuscular TID PRN Kapoor, Sahil, MD       And   LORazepam  (ATIVAN ) injection 2 mg  2 mg Intramuscular TID PRN Kapoor, Sahil, MD       haloperidol  lactate (HALDOL ) injection 10 mg  10 mg Intramuscular TID PRN Kapoor, Sahil, MD       And   diphenhydrAMINE  (BENADRYL ) injection 50 mg  50 mg Intramuscular TID PRN Kapoor, Sahil, MD       And   LORazepam  (ATIVAN ) injection 2 mg  2 mg Intramuscular TID PRN Kapoor, Sahil, MD       gabapentin  (NEURONTIN ) tablet 300 mg  300 mg Oral TID Terrie Haring, MD       hydrOXYzine  (ATARAX ) tablet 25 mg  25 mg Oral Q6H PRN Emery Dupuy, MD   25 mg at 11/03/24 2215   levothyroxine  (SYNTHROID ) tablet 50  mcg  50 mcg Oral Daily Madelene Kaatz, MD       losartan  (COZAAR ) tablet 100 mg  100 mg Oral Daily Ravenna Legore, MD       magnesium  hydroxide (MILK OF MAGNESIA) suspension 30 mL  30 mL Oral Daily PRN Kapoor, Sahil, MD       mirtazapine  (REMERON ) tablet 7.5 mg  7.5 mg Oral QHS Rosealee Recinos, MD   7.5 mg at 11/03/24 2215   nicotine  (NICODERM CQ  - dosed in mg/24 hours) patch 21 mg  21 mg Transdermal Q0600 Kapoor, Sahil, MD       nitrofurantoin  (macrocrystal-monohydrate) (MACROBID ) capsule 100 mg  100 mg Oral Q12H Jared Whorley, MD       nystatin -triamcinolone  (MYCOLOG II) cream 1 Application  1 Application Topical BID Drayden Lukas, MD   1 Application at 11/04/24 1129   traZODone  (DESYREL ) tablet 100 mg  100 mg Oral QHS PRN Paul Torpey, MD   100 mg at 11/03/24 2215   Current  Outpatient Medications  Medication Sig Dispense Refill   amLODipine  (NORVASC ) 10 MG tablet Take 10 mg by mouth.     busPIRone  (BUSPAR ) 5 MG tablet Take 5 mg by mouth 3 (three) times daily.     cetirizine  (ZYRTEC  ALLERGY) 10 MG tablet Take 1 tablet (10 mg total) by mouth daily. (Patient taking differently: Take 10 mg by mouth daily as needed for allergies.) 30 tablet 11   ferrous sulfate  325 (65 FE) MG tablet Take 1 tablet (325 mg total) by mouth daily. 30 tablet 1   fluticasone  (FLONASE ) 50 MCG/ACT nasal spray Place 2 sprays into both nostrils daily. (Patient taking differently: Place 2 sprays into both nostrils daily as needed for allergies.) 16 g 6   gabapentin  (NEURONTIN ) 300 MG capsule Take 300 mg by mouth 3 (three) times daily.     hydrOXYzine  (ATARAX ) 25 MG tablet Take 1 tablet (25 mg total) by mouth every 6 (six) hours as needed for anxiety. 30 tablet 0   levothyroxine  (SYNTHROID ) 50 MCG tablet Take 1 tablet (50 mcg total) by mouth daily at 6 (six) AM. 30 tablet 0   losartan  (COZAAR ) 100 MG tablet Take 1 tablet (100 mg total) by mouth daily. 30 tablet 0   melatonin 3 MG TABS tablet Take 3 mg by mouth at bedtime as needed (sleep).     meloxicam  (MOBIC ) 15 MG tablet Take 15 mg by mouth daily.     pantoprazole  (PROTONIX ) 40 MG tablet Take 1 tablet (40 mg total) by mouth daily. 30 tablet 1   sertraline  (ZOLOFT ) 50 MG tablet Take 1 tablet (50 mg total) by mouth daily. 30 tablet 0   traZODone  (DESYREL ) 50 MG tablet Take 100 mg by mouth at bedtime as needed for sleep.     Vitamin D , Ergocalciferol , (DRISDOL ) 1.25 MG (50000 UNIT) CAPS capsule Take 1 capsule (50,000 Units total) by mouth every 7 (seven) days. 4 capsule 2    Labs  Lab Results:  Admission on 11/03/2024  Component Date Value Ref Range Status   WBC 11/03/2024 10.0  4.0 - 10.5 K/uL Final   RBC 11/03/2024 4.51  3.87 - 5.11 MIL/uL Final   Hemoglobin 11/03/2024 11.2 (L)  12.0 - 15.0 g/dL Final   HCT 87/75/7974 36.2  36.0 - 46.0 %  Final   MCV 11/03/2024 80.3  80.0 - 100.0 fL Final   MCH 11/03/2024 24.8 (L)  26.0 - 34.0 pg Final   MCHC 11/03/2024 30.9  30.0 - 36.0 g/dL Final  RDW 11/03/2024 20.4 (H)  11.5 - 15.5 % Final   Platelets 11/03/2024 311  150 - 400 K/uL Final   nRBC 11/03/2024 0.0  0.0 - 0.2 % Final   Neutrophils Relative % 11/03/2024 58  % Final   Neutro Abs 11/03/2024 5.8  1.7 - 7.7 K/uL Final   Lymphocytes Relative 11/03/2024 32  % Final   Lymphs Abs 11/03/2024 3.2  0.7 - 4.0 K/uL Final   Monocytes Relative 11/03/2024 8  % Final   Monocytes Absolute 11/03/2024 0.8  0.1 - 1.0 K/uL Final   Eosinophils Relative 11/03/2024 1  % Final   Eosinophils Absolute 11/03/2024 0.1  0.0 - 0.5 K/uL Final   Basophils Relative 11/03/2024 1  % Final   Basophils Absolute 11/03/2024 0.1  0.0 - 0.1 K/uL Final   Immature Granulocytes 11/03/2024 0  % Final   Abs Immature Granulocytes 11/03/2024 0.03  0.00 - 0.07 K/uL Final   Performed at [facility], [address] [place], [place] [address]   Sodium 11/03/2024 138  135 - 145 mmol/L Final   Potassium 11/03/2024 4.2  3.5 - 5.1 mmol/L Final   Chloride 11/03/2024 103  98 - 111 mmol/L Final   CO2 11/03/2024 24  22 - 32 mmol/L Final   Glucose, Bld 11/03/2024 106 (H)  70 - 99 mg/dL Final   Glucose reference range applies only to samples taken after fasting for at least 8 hours.   BUN 11/03/2024 15  6 - 20 mg/dL Final   Creatinine, Ser 11/03/2024 0.94  0.44 - 1.00 mg/dL Final   Calcium 87/75/7974 9.3  8.9 - 10.3 mg/dL Final   Total Protein 87/75/7974 7.4  6.5 - 8.1 g/dL Final   Albumin 87/75/7974 4.0  3.5 - 5.0 g/dL Final   AST 87/75/7974 22  15 - 41 U/L Final   ALT 11/03/2024 20  0 - 44 U/L Final   Alkaline Phosphatase 11/03/2024 112  38 - 126 U/L Final   Total Bilirubin 11/03/2024 0.3  0.0 - 1.2 mg/dL Final   GFR, Estimated 11/03/2024 >60  >60 mL/min Final   Comment: (NOTE) Calculated using the CKD-EPI Creatinine Equation (2021)    Anion gap 11/03/2024 11  5 - 15  Final   Performed at [facility], [address] [place], [place] [address]   Alcohol, Ethyl (B) 11/03/2024 <15  <15 mg/dL Final   Comment: (NOTE) For medical purposes only. Performed at [facility], [address] [place], [place] [address]    Hgb A1c MFr Bld 11/03/2024 5.2  4.8 - 5.6 % Final   Comment: (NOTE) Diagnosis of Diabetes The following HbA1c ranges recommended by the American Diabetes Association (ADA) may be used as an aid in the diagnosis of diabetes mellitus.  Hemoglobin             Suggested A1C NGSP%              Diagnosis  <5.7                   Non Diabetic  5.7-6.4                Pre-Diabetic  >6.4                   Diabetic  <7.0                   Glycemic control for  adults with diabetes.     Mean Plasma Glucose 11/03/2024 102.54  mg/dL Final   Performed at [facility], [address], [place], [place] [address]   Cholesterol 11/03/2024 187  0 - 200 mg/dL Final   Comment:        ATP III CLASSIFICATION:  <200     mg/dL   Desirable  799-760  mg/dL   Borderline High  >=759    mg/dL   High           Triglycerides 11/03/2024 68  <150 mg/dL Final   HDL 87/75/7974 84  >40 mg/dL Final   Total CHOL/HDL Ratio 11/03/2024 2.2  RATIO Final   VLDL 11/03/2024 14  0 - 40 mg/dL Final   LDL Cholesterol 11/03/2024 89  0 - 99 mg/dL Final   Comment:        Total Cholesterol/HDL:CHD Risk Coronary Heart Disease Risk Table                     Men   Women  1/2 Average Risk   3.4   3.3  Average Risk       5.0   4.4  2 X Average Risk   9.6   7.1  3 X Average Risk  23.4   11.0        Use the calculated Patient Ratio above and the CHD Risk Table to determine the patient's CHD Risk.        ATP III CLASSIFICATION (LDL):  <100     mg/dL   Optimal  899-870  mg/dL   Near or Above                    Optimal  130-159  mg/dL   Borderline  839-810  mg/dL   High  >809     mg/dL   Very High Performed at [facility],  [address], [place], [place] [address]    TSH 11/03/2024 1.060  0.350 - 4.500 uIU/mL Final   Performed at [facility], [address] [place], [place] [address]  Admission on 11/03/2024, Discharged on 11/03/2024  Component Date Value Ref Range Status   SARS Coronavirus 2 by RT PCR 11/03/2024 NEGATIVE  NEGATIVE Final   Performed at [facility], [address] [place], [place] [address]   WBC 11/03/2024 10.7 (H)  4.0 - 10.5 K/uL Final   RBC 11/03/2024 4.80  3.87 - 5.11 MIL/uL Final   Hemoglobin 11/03/2024 12.0  12.0 - 15.0 g/dL Final   HCT 87/75/7974 38.4  36.0 - 46.0 % Final   MCV 11/03/2024 80.0  80.0 - 100.0 fL Final   MCH 11/03/2024 25.0 (L)  26.0 - 34.0 pg Final   MCHC 11/03/2024 31.3  30.0 - 36.0 g/dL Final   RDW 87/75/7974 20.6 (H)  11.5 - 15.5 % Final   Platelets 11/03/2024 323  150 - 400 K/uL Final   nRBC 11/03/2024 0.0  0.0 - 0.2 % Final   Neutrophils Relative % 11/03/2024 66  % Final   Neutro Abs 11/03/2024 7.2  1.7 - 7.7 K/uL Final   Lymphocytes Relative 11/03/2024 22  % Final   Lymphs Abs 11/03/2024 2.3  0.7 - 4.0 K/uL Final   Monocytes Relative 11/03/2024 10  % Final   Monocytes Absolute 11/03/2024 1.0  0.1 - 1.0 K/uL Final   Eosinophils Relative 11/03/2024 1  % Final   Eosinophils Absolute 11/03/2024 0.1  0.0 - 0.5 K/uL Final   Basophils Relative 11/03/2024 1  % Final   Basophils Absolute 11/03/2024 0.1  0.0 - 0.1 K/uL Final   Immature Granulocytes 11/03/2024 0  % Final   Abs Immature Granulocytes 11/03/2024 0.03  0.00 - 0.07 K/uL Final   Performed at [facility], [address], [place], [place] [address]   Sodium 11/03/2024 137  135 - 145 mmol/L Final   Potassium 11/03/2024 4.6  3.5 - 5.1 mmol/L Final   Chloride 11/03/2024 104  98 - 111 mmol/L Final   CO2 11/03/2024 24  22 - 32 mmol/L Final   Glucose, Bld 11/03/2024 89  70 - 99 mg/dL Final   Glucose reference range applies only to samples taken after fasting for at least 8 hours.   BUN  11/03/2024 11  6 - 20 mg/dL Final   Creatinine, Ser 11/03/2024 0.81  0.44 - 1.00 mg/dL Final   Calcium 87/75/7974 9.3  8.9 - 10.3 mg/dL Final   Total Protein 87/75/7974 7.6  6.5 - 8.1 g/dL Final   Albumin 87/75/7974 4.3  3.5 - 5.0 g/dL Final   AST 87/75/7974 18  15 - 41 U/L Final   ALT 11/03/2024 21  0 - 44 U/L Final   Alkaline Phosphatase 11/03/2024 126  38 - 126 U/L Final   Total Bilirubin 11/03/2024 0.3  0.0 - 1.2 mg/dL Final   GFR, Estimated 11/03/2024 >60  >60 mL/min Final   Comment: (NOTE) Calculated using the CKD-EPI Creatinine Equation (2021)    Anion gap 11/03/2024 9  5 - 15 Final   Performed at [facility], [address], [place], [place] [address]   Hgb A1c MFr Bld 11/03/2024 5.3  4.8 - 5.6 % Final   Comment: (NOTE) Diagnosis of Diabetes The following HbA1c ranges recommended by the American Diabetes Association (ADA) may be used as an aid in the diagnosis of diabetes mellitus.  Hemoglobin             Suggested A1C NGSP%              Diagnosis  <5.7                   Non Diabetic  5.7-6.4                Pre-Diabetic  >6.4                   Diabetic  <7.0                   Glycemic control for                       adults with diabetes.     Mean Plasma Glucose 11/03/2024 105.41  mg/dL Final   Performed at [facility], [address] [place], [place] [address]   Alcohol, Ethyl (B) 11/03/2024 <15  <15 mg/dL Final   Comment: (NOTE) For medical purposes only. Performed at [facility], [address] [place], [place] [address]    Cholesterol 11/03/2024 204 (H)  0 - 200 mg/dL Final   Comment:        ATP III CLASSIFICATION:  <200     mg/dL   Desirable  799-760  mg/dL   Borderline High  >=759    mg/dL   High           Triglycerides 11/03/2024 81  <150 mg/dL  Final   HDL 11/03/2024 89  >40 mg/dL Final   Total CHOL/HDL Ratio 11/03/2024 2.3  RATIO Final   VLDL 11/03/2024 16  0 - 40 mg/dL Final   LDL Cholesterol 11/03/2024 99  0 - 99 mg/dL  Final   Comment:        Total Cholesterol/HDL:CHD Risk Coronary Heart Disease Risk Table                     Men   Women  1/2 Average Risk   3.4   3.3  Average Risk       5.0   4.4  2 X Average Risk   9.6   7.1  3 X Average Risk  23.4   11.0        Use the calculated Patient Ratio above and the CHD Risk Table to determine the patient's CHD Risk.        ATP III CLASSIFICATION (LDL):  <100     mg/dL   Optimal  899-870  mg/dL   Near or Above                    Optimal  130-159  mg/dL   Borderline  839-810  mg/dL   High  >809     mg/dL   Very High Performed at [facility], [address] [place], [place] [address]    TSH 11/03/2024 1.080  0.350 - 4.500 uIU/mL Final   Performed at [facility], [address], [place], [place] [address]   Color, Urine 11/03/2024 YELLOW  YELLOW Final   APPearance 11/03/2024 CLOUDY (A)  CLEAR Final   Specific Gravity, Urine 11/03/2024 1.026  1.005 - 1.030 Final   pH 11/03/2024 5.0  5.0 - 8.0 Final   Glucose, UA 11/03/2024 NEGATIVE  NEGATIVE mg/dL Final   Hgb urine dipstick 11/03/2024 NEGATIVE  NEGATIVE Final   Bilirubin Urine 11/03/2024 NEGATIVE  NEGATIVE Final   Ketones, ur 11/03/2024 NEGATIVE  NEGATIVE mg/dL Final   Protein, ur 87/75/7974 30 (A)  NEGATIVE mg/dL Final   Nitrite 87/75/7974 NEGATIVE  NEGATIVE Final   Leukocytes,Ua 11/03/2024 TRACE (A)  NEGATIVE Final   RBC / HPF 11/03/2024 0-5  0 - 5 RBC/hpf Final   WBC, UA 11/03/2024 0-5  0 - 5 WBC/hpf Final   Bacteria, UA 11/03/2024 RARE (A)  NONE SEEN Final   Squamous Epithelial / HPF 11/03/2024 11-20  0 - 5 /HPF Final   Mucus 11/03/2024 PRESENT   Final   Performed at [facility], [address] [place], [place] [address]   POC Amphetamine UR 11/03/2024 None Detected  NONE DETECTED (Cut Off Level 1000 ng/mL) Final   POC Secobarbital (BAR) 11/03/2024 None Detected  NONE DETECTED (Cut Off Level 300 ng/mL) Final   POC Buprenorphine (BUP) 11/03/2024 None Detected  NONE  DETECTED (Cut Off Level 10 ng/mL) Final   POC Oxazepam (BZO) 11/03/2024 None Detected  NONE DETECTED (Cut Off Level 300 ng/mL) Final   POC Cocaine UR 11/03/2024 Positive (A)  NONE DETECTED (Cut Off Level 300 ng/mL) Final   POC Methamphetamine UR 11/03/2024 None Detected  NONE DETECTED (Cut Off Level 1000 ng/mL) Final   POC Morphine  11/03/2024 None Detected  NONE DETECTED (Cut Off Level 300 ng/mL) Final   POC Methadone UR 11/03/2024 None Detected  NONE DETECTED (Cut Off Level 300 ng/mL) Final   POC Oxycodone UR 11/03/2024 None Detected  NONE DETECTED (Cut Off Level 100  ng/mL) Final   POC Marijuana UR 11/03/2024 None Detected  NONE DETECTED (Cut Off Level 50 ng/mL) Final  Admission on 07/15/2024, Discharged on 07/17/2024  Component Date Value Ref Range Status   Preg Test, Ur 07/15/2024 Negative  Negative Final   TSH 07/15/2024 0.727  0.350 - 4.500 uIU/mL Final   Comment: Performed by a 3rd Generation assay with a functional sensitivity of <=0.01 uIU/mL. Performed at [facility], [address] [place], [place] [address]   Admission on 07/14/2024, Discharged on 07/15/2024  Component Date Value Ref Range Status   Sodium 07/14/2024 139  135 - 145 mmol/L Final   Potassium 07/14/2024 4.3  3.5 - 5.1 mmol/L Final   Chloride 07/14/2024 105  98 - 111 mmol/L Final   CO2 07/14/2024 22  22 - 32 mmol/L Final   Glucose, Bld 07/14/2024 72  70 - 99 mg/dL Final   Glucose reference range applies only to samples taken after fasting for at least 8 hours.   BUN 07/14/2024 10  6 - 20 mg/dL Final   Creatinine, Ser 07/14/2024 0.64  0.44 - 1.00 mg/dL Final   Calcium 90/96/7974 9.6  8.9 - 10.3 mg/dL Final   Total Protein 90/96/7974 7.6  6.5 - 8.1 g/dL Final   Albumin 90/96/7974 4.1  3.5 - 5.0 g/dL Final   AST 90/96/7974 29  15 - 41 U/L Final   ALT 07/14/2024 25  0 - 44 U/L Final   Alkaline Phosphatase 07/14/2024 119  38 - 126 U/L Final   Total Bilirubin 07/14/2024 0.4  0.0 - 1.2 mg/dL Final   GFR,  Estimated 07/14/2024 >60  >60 mL/min Final   Comment: (NOTE) Calculated using the CKD-EPI Creatinine Equation (2021)    Anion gap 07/14/2024 13  5 - 15 Final   Performed at [facility], [address], [place], [place] [address]   Alcohol, Ethyl (B) 07/14/2024 <15  <15 mg/dL Final   Comment: (NOTE) For medical purposes only. Performed at [facility], [address], [place], [place] [address]    WBC 07/14/2024 13.7 (H)  4.0 - 10.5 K/uL Final   RBC 07/14/2024 4.32  3.87 - 5.11 MIL/uL Final   Hemoglobin 07/14/2024 10.3 (L)  12.0 - 15.0 g/dL Final   HCT 90/96/7974 33.7 (L)  36.0 - 46.0 % Final   MCV 07/14/2024 78.0 (L)  80.0 - 100.0 fL Final   MCH 07/14/2024 23.8 (L)  26.0 - 34.0 pg Final   MCHC 07/14/2024 30.6  30.0 - 36.0 g/dL Final   RDW 90/96/7974 19.3 (H)  11.5 - 15.5 % Final   Platelets 07/14/2024 380  150 - 400 K/uL Final   nRBC 07/14/2024 0.0  0.0 - 0.2 % Final   Performed at [facility], [address], [place], [place] [address]   Opiates 07/14/2024 NEGATIVE  NEGATIVE Final   Cocaine 07/14/2024 POSITIVE (A)  NEGATIVE Final   Benzodiazepines 07/14/2024 NEGATIVE  NEGATIVE Final   Amphetamines 07/14/2024 NEGATIVE  NEGATIVE Final   Tetrahydrocannabinol 07/14/2024 NEGATIVE  NEGATIVE Final   Barbiturates 07/14/2024 NEGATIVE  NEGATIVE Final   Methadone Scn, Ur 07/14/2024 NEGATIVE  NEGATIVE Final   Fentanyl  07/14/2024 NEGATIVE  NEGATIVE Final   Comment: (NOTE) Drug screen is for Medical Purposes only. Positive results are preliminary only. If confirmation is needed, notify lab within 5 days.  Drug Class                 Cutoff (ng/mL)  Amphetamine and metabolites 1000 Barbiturate and metabolites 200 Benzodiazepine              200 Opiates and metabolites     300 Cocaine and metabolites     300 THC                         50 Fentanyl                     5 Methadone                   300  Trazodone  is metabolized in  vivo to several metabolites,  including pharmacologically active m-CPP, which is excreted in the  urine.  Immunoassay screens for amphetamines and MDMA have potential  cross-reactivity with these compounds and may provide false positive  result.  Performed at [facility], [address] [place], [place] [address]    Acetaminophen  (Tylenol ), Serum 07/14/2024 <10 (L)  10 - 30 ug/mL Final   Comment: (NOTE) Toxic concentrations can be more effectively related to post dose interval; > 200, > 100, and > 50 ug/mL serum concentrations correspond to toxic concentrations at 4, 8, and 12 hours post dose, respectively.  Performed at [facility], [address] [place], [place] [address]     Blood Alcohol level:  Lab Results  Component Value Date   [facility] <15 11/03/2024   ETH <15 11/03/2024    Metabolic Disorder Labs: Lab Results  Component Value Date   HGBA1C 5.2 11/03/2024   MPG 102.54 11/03/2024   MPG 105.41 11/03/2024   No results found for: PROLACTIN Lab Results  Component Value Date   CHOL 187 11/03/2024   TRIG 68 11/03/2024   HDL 84 11/03/2024   CHOLHDL 2.2 11/03/2024   VLDL 14 11/03/2024   LDLCALC 89 11/03/2024   LDLCALC 99 11/03/2024    Therapeutic Lab Levels: No results found for: LITHIUM No results found for: VALPROATE No results found for: CBMZ  Physical Findings   AIMS    Flowsheet Row [facility] Ad[facility] ([facility]) [facility]  AIMS Total Score 0 0   AUDIT    Flowsheet Row Ad[facility]) [facility] Admission (Discharged) [facility] Admission (Discharged) [facility]  Alcohol Use Disorder Identification Test Final  Score (AUDIT) 25 34 5   PHQ2-9    Flowsheet Row ED from 11/03/2024 in [facility] ED from 07/15/2024 in [facility] Office Visit from 06/16/2020 in [facility] [place] ED from 06/15/2020 in [facility]  PHQ-2 Total Score 6 6 4 2   PHQ-9 Total Score 23 27 17 9    Flowsheet Row ED from 11/03/2024 in [facility] Most recent reading at 11/03/2024 11:55 PM ED from 11/03/2024 in [facility] Most recent reading at 11/03/2024 12:43 PM ED from 07/15/2024 in [facility] Most recent reading at 07/15/2024  6:40 AM  C-SSRS RISK CATEGORY Moderate Risk No Risk High Risk     Musculoskeletal  Strength & Muscle Tone: within normal limits Gait & Station: normal Patient leans: N/A  Psychiatric Specialty Exam  Appearance:  AAF, appearing stated age,  wearing appropriate to the situation hospital clothes, with fair grooming and hygiene. Normal level of alertness and appropriate facial expression.   Attitude/Behavior: calm, cooperative, engaging with appropriate eye contact.   Motor: WNL; dyskinesias not evident. Gait appears in full range.   Speech: spontaneous, clear, coherent, normal comprehension.   Mood: up and down .   Affect: appropriately-reactive, full range.   Thought process: patient appears coherent, organized, logical, goal-directed.   Thought content: patient denies suicidal thoughts, denies homicidal thoughts; did not express any delusions.   Thought perception: patient denies auditory and visual hallucinations. Did not appear internally stimulated.   Cognition: patient is alert and oriented in self, place, date; with intact attention and concentration.   Insight: fair.   Judgement: fair.      Assets  Assets: Manufacturing Systems Engineer; Desire for  Improvement; Financial Resources/Insurance; Housing   Sleep  Sleep: Sleep: Fair  Estimated Sleeping Duration (Last 24 Hours): 11.00-13.75 hours  Nutritional Assessment (For OBS and [facility] admissions only) Has the patient had a weight loss or gain of 10 pounds or more in the last 3 months?: No Has the patient had a decrease in food intake/or appetite?: No Does the patient have dental problems?: No Does the patient have eating habits or behaviors that may be indicators of an eating disorder including binging or inducing vomiting?: No Has the patient recently lost weight without trying?: 0 Has the patient been eating poorly because of a decreased appetite?: 0 Malnutrition Screening Tool Score: 0    Physical Exam  Physical Exam ROS Blood pressure 118/62, pulse 64, temperature 98.4 F (36.9 C), temperature source Oral, resp. rate 19, SpO2 99%. There is no height or weight on file to calculate BMI.  Treatment Plan Summary: Daily contact with patient to assess and evaluate symptoms and progress in treatment and Medication management  53yo F with pash h/o substance use disorder (cocaine, alcohol), depression, anxiety, admitted to [facility] for mood stabilization and substance use treatment.   Impression: Cocaine use disorder Alcohol use disorder Substance-induced mood disorder.   Plan: -continue admission to [facility]   -CIWA monitoring.   -continue Abilify  5mg  po daily for mood stabilization -continue Remeron  7.5mg  PO at bedtime for sleep, depression. -continue Trazodone  100mg  PO at bedtime PRN insomnia.  -continue Hydroxyzine  25mg  PO TID PRN anxiety.   -cont. Amlodipine  10mg  po daily for HTN -cont Losartan  100mg  po daily for HTN -cont Gabapentin  300mg  PO TID for polyneuropathy -cont Levothyroxine  50mcg po daily for hypothyroidism -cont Nicotine  transdermal patch 21mg  daily for tobacco cravings -start Marcobid (Nitrofurantoin ) 100mg  PO Q12h for 5 days for UTI sx   Other  PRNs: acetaminophen , alum & mag hydroxide-simeth, cloNIDine , haloperidol  **AND** diphenhydrAMINE , haloperidol  lactate **AND** diphenhydrAMINE  **AND** LORazepam , haloperidol  lactate **AND** diphenhydrAMINE  **AND** LORazepam , hydrOXYzine , magnesium  hydroxide, traZODone    Dispo: SW to refer the patient for residential addiction treatment program.  Neil Appl, MD 11/04/2024 12:48 PM

## 2024-11-04 NOTE — Care Management (Signed)
 [facility] Care Management...  Writer received a call from [employer] [facility] [place].  They requested the client call in to complete a phone assessment.  Writer provided them with the phone number to call [phone].  Client reports she will call after breakfast.

## 2024-11-05 ENCOUNTER — Other Ambulatory Visit: Payer: Self-pay

## 2024-11-05 DIAGNOSIS — F411 Generalized anxiety disorder: Secondary | ICD-10-CM | POA: Diagnosis not present

## 2024-11-05 DIAGNOSIS — F1410 Cocaine abuse, uncomplicated: Secondary | ICD-10-CM | POA: Diagnosis not present

## 2024-11-05 DIAGNOSIS — F1090 Alcohol use, unspecified, uncomplicated: Secondary | ICD-10-CM | POA: Diagnosis not present

## 2024-11-05 DIAGNOSIS — F321 Major depressive disorder, single episode, moderate: Secondary | ICD-10-CM | POA: Diagnosis not present

## 2024-11-05 NOTE — ED Notes (Signed)
 Patient denies pain and is resting comfortably.

## 2024-11-05 NOTE — ED Notes (Signed)
 Pt medication compliant affect bright.  Answers questions asked appropriately.  In no apparent distress.  Safety maintained.

## 2024-11-05 NOTE — Discharge Instructions (Signed)
 [facility] Care Management...  Writer spoke with Olivia @ [facility]  Per Olivia patient has been accepted to complete 2 part intake for residential placement  Writer coordinated and intake interview for Monday 11/08/24 at 9:00 am.   [facility] - [facility] [address] [place], [place] [address] Phone: [phone]  RN will arrange transportation  7 day supple and scripts

## 2024-11-05 NOTE — Care Management (Addendum)
 [facility] Care Management...  Addendum 2:36 pm  Writer spoke with Olivia @ [facility]  Per Olivia patient has been accepted to complete 2 part intake for residential placement  Writer coordinated and intake interview for Monday 11/08/24 at 9:00 am.   [facility] - [facility] [address] [place], [place] [address] Phone: [phone]  RN will arrange transportation  7 day supple and scripts  Addendum 11:25 am  Patient reported speaking with Olivia at [facility]..  Patient request for referral be sent to [facility] faxed referral to [facility] assisted patient in completing phone assessment wit [facility] [place]  Per intake representative patient has been accepted to their program  [facility] needs to verify if they pick up service this weekend   Patient request possible discharge Saturday 12/27 or Sunday 12/28  Writer advised patient to let me know what day she wants to leave  Patient reported that she needs to call some one to bring her clothes  [facility] [place]  [address], [place], [place] [address] Phone: [phone]    Transportation to be determined [employer])  7 day samples 30 day scripts

## 2024-11-05 NOTE — ED Notes (Signed)
 Pt.received in the dayroom at the beginning of the shift without complaints.  Safety maintaiend.

## 2024-11-05 NOTE — ED Notes (Signed)
 Pt received at the beginning of the shift bed resting , responsive to name being called refusing nicotine   patch statig that her mycolog cream does not work and Mycolog.  Medication education provided verbalizes with complete underatanding.  Safety maintained.

## 2024-11-05 NOTE — Group Note (Signed)
 Group Topic: Relapse and Recovery  Group Date: 11/05/2024 Start Time: 8069 End Time: 2000 Facilitators: Verdon Jacqualyn BRAVO, NT  Department: [facility]  Number of Participants: 8  Group Focus: coping skills Treatment Modality:  Individual Therapy Interventions utilized were group exercise Purpose: relapse prevention strategies  Name: Marie Atkins Date of Birth: [birth year]  MR: [id number]    Level of Participation: active Quality of Participation: cooperative Interactions with others: gave feedback Mood/Affect: appropriate  Triggers (if applicable): n.a Cognition: coherent/clear Progress: Moderate Response: n/a Plan: follow-up needed  Patients Problems:  Patient Active Problem List   Diagnosis Date Noted   GAD (generalized anxiety disorder) 07/15/2024   Cocaine use 07/15/2024   Suicidal ideation 07/15/2024   Vitamin D  deficiency 12/15/2023   Severe recurrent major depressive disorder with psychotic features (HCC) 08/10/2023   Hypothyroidism 03/27/2023   Uncomplicated alcohol dependence (HCC) 03/03/2023   Cocaine use disorder (HCC) 03/03/2023   Tobacco use disorder 03/03/2023   MDD (major depressive disorder), recurrent, severe, with psychosis (HCC) 03/02/2023   Severe recurrent major depression without psychotic features (HCC) 06/24/2020   Menorrhagia 06/16/2020   Iron deficiency anemia due to chronic blood loss 06/16/2020   Proteinuria 06/16/2020   Depression 06/16/2020   Tobacco dependence due to cigarettes 06/16/2020   ASCUS with positive high risk HPV cervical 06/16/2020   Amphetamine and psychostimulant-induced psychosis with delusions (HCC)    Benign essential HTN 02/16/2016   History of bilateral tubal ligation 08/27/2014

## 2024-11-05 NOTE — ED Notes (Signed)
 Pt attending roup and participating.  Medication education provided, pt response with understanding .  Denies distress or discomfort.  Safety maintained.

## 2024-11-05 NOTE — Group Note (Signed)
 Group Topic: Fears and Unhealthy Coping Skills  Group Date: 11/05/2024 Start Time: 1400 End Time: 1435 Facilitators: Deidre Prentis CROME, NT  Department: [facility]  Number of Participants: 4  Group Focus: coping skills Treatment Modality:  Psychoeducation Interventions utilized were story telling Purpose: increase insight  Name: Marie Atkins Date of Birth: [birth year]  MR: [id number]    Level of Participation: Pt did not attend group. Patients Problems:  Patient Active Problem List   Diagnosis Date Noted   GAD (generalized anxiety disorder) 07/15/2024   Cocaine use 07/15/2024   Suicidal ideation 07/15/2024   Vitamin D  deficiency 12/15/2023   Severe recurrent major depressive disorder with psychotic features (HCC) 08/10/2023   Hypothyroidism 03/27/2023   Uncomplicated alcohol dependence (HCC) 03/03/2023   Cocaine use disorder (HCC) 03/03/2023   Tobacco use disorder 03/03/2023   MDD (major depressive disorder), recurrent, severe, with psychosis (HCC) 03/02/2023   Severe recurrent major depression without psychotic features (HCC) 06/24/2020   Menorrhagia 06/16/2020   Iron deficiency anemia due to chronic blood loss 06/16/2020   Proteinuria 06/16/2020   Depression 06/16/2020   Tobacco dependence due to cigarettes 06/16/2020   ASCUS with positive high risk HPV cervical 06/16/2020   Amphetamine and psychostimulant-induced psychosis with delusions (HCC)    Benign essential HTN 02/16/2016   History of bilateral tubal ligation 08/27/2014

## 2024-11-05 NOTE — ED Notes (Signed)
 Pt sitting in dayroom interacting with peers and watching TV. No acute distress noted. Continue to monitor for safety.

## 2024-11-05 NOTE — ED Provider Notes (Signed)
 Behavioral Health Progress Note  Date and Time: 11/05/2024 11:03 AM Name: Marie Atkins MRN:  [id number]  Patient is a 53yo F with psych history of MDD, GAD, cocaine use disorder, alcohol use disorder, whpo was admitted to [facility] for treatment of addiction and mood stabilization.   Interval History Patient was seen today for re-evaluation.  Nursing reports no events overnight. The patient has no issues with performing ADLs.  Patient has been medication compliant.    Patient was seen and interviewed by attending psychiatrist. Chart reviewed. Patient discussed during treatment team rounds.  Subjective:  On assessment patient reports feeling okay, less depressed. She was reportedlt accepted for the residential treatment to [facility] [place], she says she would prefer [facility] and hopes they will accept her, too, although is ok with [facility] as well. She denies suicidal thoughts, plans. She is more active and less sleepy today. She reports no side effects from her current medications (Abilify , Remeron ). She denies auditory or visual hallucinations, denies feeling paranoid, does not express any delusions. No thoughts or plans of hurting others. No current symptoms of acute alcohol withdrawal. She reports some remaining perineal discomfort and burning - a little improvement after abx started.   Diagnosis:  Final diagnoses:  None    Total Time spent with patient: 30 minutes  Past psych history, social history, family history - see H&P.   Sleep: Fair  Appetite:  Fair  Current Medications:  Current Facility-Administered Medications  Medication Dose Route Frequency Provider Last Rate Last Admin   acetaminophen  (TYLENOL ) tablet 650 mg  650 mg Oral Q6H PRN Kapoor, Sahil, MD       alum & mag hydroxide-simeth (MAALOX/MYLANTA) 200-200-20 MG/5ML suspension 30 mL  30 mL Oral Q4H PRN Kapoor, Sahil, MD       amLODipine  (NORVASC ) tablet 10 mg  10 mg Oral Daily Larina Lieurance, MD   10 mg at 11/04/24 1307    ARIPiprazole  (ABILIFY ) tablet 5 mg  5 mg Oral Daily Honour Schwieger, MD   5 mg at 11/04/24 1015   cloNIDine  (CATAPRES ) tablet 0.1 mg  0.1 mg Oral Q12H PRN Arlene Genova, MD       haloperidol  (HALDOL ) tablet 5 mg  5 mg Oral TID PRN Kapoor, Sahil, MD       And   diphenhydrAMINE  (BENADRYL ) capsule 50 mg  50 mg Oral TID PRN Kapoor, Sahil, MD       haloperidol  lactate (HALDOL ) injection 5 mg  5 mg Intramuscular TID PRN Kapoor, Sahil, MD       And   diphenhydrAMINE  (BENADRYL ) injection 50 mg  50 mg Intramuscular TID PRN Kapoor, Sahil, MD       And   LORazepam  (ATIVAN ) injection 2 mg  2 mg Intramuscular TID PRN Kapoor, Sahil, MD       haloperidol  lactate (HALDOL ) injection 10 mg  10 mg Intramuscular TID PRN Kapoor, Sahil, MD       And   diphenhydrAMINE  (BENADRYL ) injection 50 mg  50 mg Intramuscular TID PRN Kapoor, Sahil, MD       And   LORazepam  (ATIVAN ) injection 2 mg  2 mg Intramuscular TID PRN Kapoor, Sahil, MD       gabapentin  (NEURONTIN ) capsule 300 mg  300 mg Oral TID Marynell Bies, MD   300 mg at 11/04/24 2120   hydrOXYzine  (ATARAX ) tablet 25 mg  25 mg Oral Q6H PRN Emunah Texidor, MD   25 mg at 11/03/24 2215   levothyroxine  (SYNTHROID ) tablet 50 mcg  50 mcg Oral Daily Gesenia Bantz, MD   50 mcg at 11/04/24 1307   losartan  (COZAAR ) tablet 100 mg  100 mg Oral Daily Jhoan Schmieder, MD   100 mg at 11/04/24 1306   magnesium  hydroxide (MILK OF MAGNESIA) suspension 30 mL  30 mL Oral Daily PRN Kapoor, Sahil, MD       mirtazapine  (REMERON ) tablet 7.5 mg  7.5 mg Oral QHS Marquin Patino, MD   7.5 mg at 11/04/24 2120   naproxen  (NAPROSYN ) tablet 375 mg  375 mg Oral TID WC Brett Darko, MD   375 mg at 11/04/24 1550   nicotine  (NICODERM CQ  - dosed in mg/24 hours) patch 21 mg  21 mg Transdermal Q0600 Kapoor, Sahil, MD       nitrofurantoin  (macrocrystal-monohydrate) (MACROBID ) capsule 100 mg  100 mg Oral Q12H Carlous Olivares, MD   100 mg at 11/04/24 2120   nystatin -triamcinolone  (MYCOLOG II) cream 1 Application  1  Application Topical BID Wade Sigala, MD   1 Application at 11/04/24 2120   traZODone  (DESYREL ) tablet 100 mg  100 mg Oral QHS PRN Kellin Fifer, MD   100 mg at 11/03/24 2215   Current Outpatient Medications  Medication Sig Dispense Refill   amLODipine  (NORVASC ) 10 MG tablet Take 10 mg by mouth.     busPIRone  (BUSPAR ) 5 MG tablet Take 5 mg by mouth 3 (three) times daily.     cetirizine  (ZYRTEC  ALLERGY) 10 MG tablet Take 1 tablet (10 mg total) by mouth daily. (Patient taking differently: Take 10 mg by mouth daily as needed for allergies.) 30 tablet 11   ferrous sulfate  325 (65 FE) MG tablet Take 1 tablet (325 mg total) by mouth daily. 30 tablet 1   fluticasone  (FLONASE ) 50 MCG/ACT nasal spray Place 2 sprays into both nostrils daily. (Patient taking differently: Place 2 sprays into both nostrils daily as needed for allergies.) 16 g 6   gabapentin  (NEURONTIN ) 300 MG capsule Take 300 mg by mouth 3 (three) times daily.     hydrOXYzine  (ATARAX ) 25 MG tablet Take 1 tablet (25 mg total) by mouth every 6 (six) hours as needed for anxiety. 30 tablet 0   levothyroxine  (SYNTHROID ) 50 MCG tablet Take 1 tablet (50 mcg total) by mouth daily at 6 (six) AM. 30 tablet 0   losartan  (COZAAR ) 100 MG tablet Take 1 tablet (100 mg total) by mouth daily. 30 tablet 0   melatonin 3 MG TABS tablet Take 3 mg by mouth at bedtime as needed (sleep).     meloxicam  (MOBIC ) 15 MG tablet Take 15 mg by mouth daily.     pantoprazole  (PROTONIX ) 40 MG tablet Take 1 tablet (40 mg total) by mouth daily. 30 tablet 1   sertraline  (ZOLOFT ) 50 MG tablet Take 1 tablet (50 mg total) by mouth daily. 30 tablet 0   traZODone  (DESYREL ) 50 MG tablet Take 100 mg by mouth at bedtime as needed for sleep.     Vitamin D , Ergocalciferol , (DRISDOL ) 1.25 MG (50000 UNIT) CAPS capsule Take 1 capsule (50,000 Units total) by mouth every 7 (seven) days. 4 capsule 2    Labs  Lab Results:  Admission on 11/03/2024  Component Date Value Ref Range Status   WBC  11/03/2024 10.0  4.0 - 10.5 K/uL Final   RBC 11/03/2024 4.51  3.87 - 5.11 MIL/uL Final   Hemoglobin 11/03/2024 11.2 (L)  12.0 - 15.0 g/dL Final   HCT 87/75/7974 36.2  36.0 - 46.0 % Final   MCV 11/03/2024  80.3  80.0 - 100.0 fL Final   MCH 11/03/2024 24.8 (L)  26.0 - 34.0 pg Final   MCHC 11/03/2024 30.9  30.0 - 36.0 g/dL Final   RDW 87/75/7974 20.4 (H)  11.5 - 15.5 % Final   Platelets 11/03/2024 311  150 - 400 K/uL Final   nRBC 11/03/2024 0.0  0.0 - 0.2 % Final   Neutrophils Relative % 11/03/2024 58  % Final   Neutro Abs 11/03/2024 5.8  1.7 - 7.7 K/uL Final   Lymphocytes Relative 11/03/2024 32  % Final   Lymphs Abs 11/03/2024 3.2  0.7 - 4.0 K/uL Final   Monocytes Relative 11/03/2024 8  % Final   Monocytes Absolute 11/03/2024 0.8  0.1 - 1.0 K/uL Final   Eosinophils Relative 11/03/2024 1  % Final   Eosinophils Absolute 11/03/2024 0.1  0.0 - 0.5 K/uL Final   Basophils Relative 11/03/2024 1  % Final   Basophils Absolute 11/03/2024 0.1  0.0 - 0.1 K/uL Final   Immature Granulocytes 11/03/2024 0  % Final   Abs Immature Granulocytes 11/03/2024 0.03  0.00 - 0.07 K/uL Final   Performed at [facility], [address], [place], [place] [address]   Sodium 11/03/2024 138  135 - 145 mmol/L Final   Potassium 11/03/2024 4.2  3.5 - 5.1 mmol/L Final   Chloride 11/03/2024 103  98 - 111 mmol/L Final   CO2 11/03/2024 24  22 - 32 mmol/L Final   Glucose, Bld 11/03/2024 106 (H)  70 - 99 mg/dL Final   Glucose reference range applies only to samples taken after fasting for at least 8 hours.   BUN 11/03/2024 15  6 - 20 mg/dL Final   Creatinine, Ser 11/03/2024 0.94  0.44 - 1.00 mg/dL Final   Calcium 87/75/7974 9.3  8.9 - 10.3 mg/dL Final   Total Protein 87/75/7974 7.4  6.5 - 8.1 g/dL Final   Albumin 87/75/7974 4.0  3.5 - 5.0 g/dL Final   AST 87/75/7974 22  15 - 41 U/L Final   ALT 11/03/2024 20  0 - 44 U/L Final   Alkaline Phosphatase 11/03/2024 112  38 - 126 U/L Final   Total Bilirubin 11/03/2024 0.3   0.0 - 1.2 mg/dL Final   GFR, Estimated 11/03/2024 >60  >60 mL/min Final   Comment: (NOTE) Calculated using the CKD-EPI Creatinine Equation (2021)    Anion gap 11/03/2024 11  5 - 15 Final   Performed at [facility], [address] [place], [place] [address]   Alcohol, Ethyl (B) 11/03/2024 <15  <15 mg/dL Final   Comment: (NOTE) For medical purposes only. Performed at [facility], [address], [place], [place] [address]    Hgb A1c MFr Bld 11/03/2024 5.2  4.8 - 5.6 % Final   Comment: (NOTE) Diagnosis of Diabetes The following HbA1c ranges recommended by the American Diabetes Association (ADA) may be used as an aid in the diagnosis of diabetes mellitus.  Hemoglobin             Suggested A1C NGSP%              Diagnosis  <5.7                   Non Diabetic  5.7-6.4                Pre-Diabetic  >6.4  Diabetic  <7.0                   Glycemic control for                       adults with diabetes.     Mean Plasma Glucose 11/03/2024 102.54  mg/dL Final   Performed at [facility], [address], [place], [place] [address]   Cholesterol 11/03/2024 187  0 - 200 mg/dL Final   Comment:        ATP III CLASSIFICATION:  <200     mg/dL   Desirable  799-760  mg/dL   Borderline High  >=759    mg/dL   High           Triglycerides 11/03/2024 68  <150 mg/dL Final   HDL 87/75/7974 84  >40 mg/dL Final   Total CHOL/HDL Ratio 11/03/2024 2.2  RATIO Final   VLDL 11/03/2024 14  0 - 40 mg/dL Final   LDL Cholesterol 11/03/2024 89  0 - 99 mg/dL Final   Comment:        Total Cholesterol/HDL:CHD Risk Coronary Heart Disease Risk Table                     Men   Women  1/2 Average Risk   3.4   3.3  Average Risk       5.0   4.4  2 X Average Risk   9.6   7.1  3 X Average Risk  23.4   11.0        Use the calculated Patient Ratio above and the CHD Risk Table to determine the patient's CHD Risk.        ATP III CLASSIFICATION (LDL):  <100     mg/dL    Optimal  899-870  mg/dL   Near or Above                    Optimal  130-159  mg/dL   Borderline  839-810  mg/dL   High  >809     mg/dL   Very High Performed at [facility], [address] [place], [place] [address]    TSH 11/03/2024 1.060  0.350 - 4.500 uIU/mL Final   Performed at [facility], [address], [place], [place] [address]  Admission on 11/03/2024, Discharged on 11/03/2024  Component Date Value Ref Range Status   SARS Coronavirus 2 by RT PCR 11/03/2024 NEGATIVE  NEGATIVE Final   Performed at [facility], [address], [place], [place] [address]   WBC 11/03/2024 10.7 (H)  4.0 - 10.5 K/uL Final   RBC 11/03/2024 4.80  3.87 - 5.11 MIL/uL Final   Hemoglobin 11/03/2024 12.0  12.0 - 15.0 g/dL Final   HCT 87/75/7974 38.4  36.0 - 46.0 % Final   MCV 11/03/2024 80.0  80.0 - 100.0 fL Final   MCH 11/03/2024 25.0 (L)  26.0 - 34.0 pg Final   MCHC 11/03/2024 31.3  30.0 - 36.0 g/dL Final   RDW 87/75/7974 20.6 (H)  11.5 - 15.5 % Final   Platelets 11/03/2024 323  150 - 400 K/uL Final   nRBC 11/03/2024 0.0  0.0 - 0.2 % Final   Neutrophils Relative % 11/03/2024 66  % Final   Neutro Abs 11/03/2024 7.2  1.7 - 7.7 K/uL Final   Lymphocytes Relative 11/03/2024 22  % Final  Lymphs Abs 11/03/2024 2.3  0.7 - 4.0 K/uL Final   Monocytes Relative 11/03/2024 10  % Final   Monocytes Absolute 11/03/2024 1.0  0.1 - 1.0 K/uL Final   Eosinophils Relative 11/03/2024 1  % Final   Eosinophils Absolute 11/03/2024 0.1  0.0 - 0.5 K/uL Final   Basophils Relative 11/03/2024 1  % Final   Basophils Absolute 11/03/2024 0.1  0.0 - 0.1 K/uL Final   Immature Granulocytes 11/03/2024 0  % Final   Abs Immature Granulocytes 11/03/2024 0.03  0.00 - 0.07 K/uL Final   Performed at [facility], [address], [place], [place] [address]   Sodium 11/03/2024 137  135 - 145 mmol/L Final   Potassium 11/03/2024 4.6  3.5 - 5.1 mmol/L Final   Chloride 11/03/2024 104  98 - 111 mmol/L Final   CO2  11/03/2024 24  22 - 32 mmol/L Final   Glucose, Bld 11/03/2024 89  70 - 99 mg/dL Final   Glucose reference range applies only to samples taken after fasting for at least 8 hours.   BUN 11/03/2024 11  6 - 20 mg/dL Final   Creatinine, Ser 11/03/2024 0.81  0.44 - 1.00 mg/dL Final   Calcium 87/75/7974 9.3  8.9 - 10.3 mg/dL Final   Total Protein 87/75/7974 7.6  6.5 - 8.1 g/dL Final   Albumin 87/75/7974 4.3  3.5 - 5.0 g/dL Final   AST 87/75/7974 18  15 - 41 U/L Final   ALT 11/03/2024 21  0 - 44 U/L Final   Alkaline Phosphatase 11/03/2024 126  38 - 126 U/L Final   Total Bilirubin 11/03/2024 0.3  0.0 - 1.2 mg/dL Final   GFR, Estimated 11/03/2024 >60  >60 mL/min Final   Comment: (NOTE) Calculated using the CKD-EPI Creatinine Equation (2021)    Anion gap 11/03/2024 9  5 - 15 Final   Performed at [facility], [address], [place], [place] [address]   Hgb A1c MFr Bld 11/03/2024 5.3  4.8 - 5.6 % Final   Comment: (NOTE) Diagnosis of Diabetes The following HbA1c ranges recommended by the American Diabetes Association (ADA) may be used as an aid in the diagnosis of diabetes mellitus.  Hemoglobin             Suggested A1C NGSP%              Diagnosis  <5.7                   Non Diabetic  5.7-6.4                Pre-Diabetic  >6.4                   Diabetic  <7.0                   Glycemic control for                       adults with diabetes.     Mean Plasma Glucose 11/03/2024 105.41  mg/dL Final   Performed at [facility], [address] [place], [place] [address]   Alcohol, Ethyl (B) 11/03/2024 <15  <15 mg/dL Final   Comment: (NOTE) For medical purposes only. Performed at [facility], [address] [place], [place] [address]    Cholesterol 11/03/2024 204 (H)  0 - 200 mg/dL Final   Comment:  ATP III CLASSIFICATION:  <200     mg/dL   Desirable  799-760  mg/dL   Borderline High  >=759    mg/dL   High           Triglycerides 11/03/2024 81  <150 mg/dL  Final   HDL 87/75/7974 89  >40 mg/dL Final   Total CHOL/HDL Ratio 11/03/2024 2.3  RATIO Final   VLDL 11/03/2024 16  0 - 40 mg/dL Final   LDL Cholesterol 11/03/2024 99  0 - 99 mg/dL Final   Comment:        Total Cholesterol/HDL:CHD Risk Coronary Heart Disease Risk Table                     Men   Women  1/2 Average Risk   3.4   3.3  Average Risk       5.0   4.4  2 X Average Risk   9.6   7.1  3 X Average Risk  23.4   11.0        Use the calculated Patient Ratio above and the CHD Risk Table to determine the patient's CHD Risk.        ATP III CLASSIFICATION (LDL):  <100     mg/dL   Optimal  899-870  mg/dL   Near or Above                    Optimal  130-159  mg/dL   Borderline  839-810  mg/dL   High  >809     mg/dL   Very High Performed at [facility], [address], [place], [place] [address]    TSH 11/03/2024 1.080  0.350 - 4.500 uIU/mL Final   Performed at [facility], [address] [place], [place] [address]   Color, Urine 11/03/2024 YELLOW  YELLOW Final   APPearance 11/03/2024 CLOUDY (A)  CLEAR Final   Specific Gravity, Urine 11/03/2024 1.026  1.005 - 1.030 Final   pH 11/03/2024 5.0  5.0 - 8.0 Final   Glucose, UA 11/03/2024 NEGATIVE  NEGATIVE mg/dL Final   Hgb urine dipstick 11/03/2024 NEGATIVE  NEGATIVE Final   Bilirubin Urine 11/03/2024 NEGATIVE  NEGATIVE Final   Ketones, ur 11/03/2024 NEGATIVE  NEGATIVE mg/dL Final   Protein, ur 87/75/7974 30 (A)  NEGATIVE mg/dL Final   Nitrite 87/75/7974 NEGATIVE  NEGATIVE Final   Leukocytes,Ua 11/03/2024 TRACE (A)  NEGATIVE Final   RBC / HPF 11/03/2024 0-5  0 - 5 RBC/hpf Final   WBC, UA 11/03/2024 0-5  0 - 5 WBC/hpf Final   Bacteria, UA 11/03/2024 RARE (A)  NONE SEEN Final   Squamous Epithelial / HPF 11/03/2024 11-20  0 - 5 /HPF Final   Mucus 11/03/2024 PRESENT   Final   Performed at [facility], [address], [place], [place] [address]   POC Amphetamine UR 11/03/2024 None Detected  NONE DETECTED (Cut Off  Level 1000 ng/mL) Final   POC Secobarbital (BAR) 11/03/2024 None Detected  NONE DETECTED (Cut Off Level 300 ng/mL) Final   POC Buprenorphine (BUP) 11/03/2024 None Detected  NONE DETECTED (Cut Off Level 10 ng/mL) Final   POC Oxazepam (BZO) 11/03/2024 None Detected  NONE DETECTED (Cut Off Level 300 ng/mL) Final   POC Cocaine UR 11/03/2024 Positive (A)  NONE DETECTED (Cut Off Level 300 ng/mL) Final   POC Methamphetamine UR 11/03/2024 None Detected  NONE DETECTED (Cut Off Level 1000 ng/mL) Final  POC Morphine  11/03/2024 None Detected  NONE DETECTED (Cut Off Level 300 ng/mL) Final   POC Methadone UR 11/03/2024 None Detected  NONE DETECTED (Cut Off Level 300 ng/mL) Final   POC Oxycodone UR 11/03/2024 None Detected  NONE DETECTED (Cut Off Level 100 ng/mL) Final   POC Marijuana UR 11/03/2024 None Detected  NONE DETECTED (Cut Off Level 50 ng/mL) Final  Admission on 07/15/2024, Discharged on 07/17/2024  Component Date Value Ref Range Status   Preg Test, Ur 07/15/2024 Negative  Negative Final   TSH 07/15/2024 0.727  0.350 - 4.500 uIU/mL Final   Comment: Performed by a 3rd Generation assay with a functional sensitivity of <=0.01 uIU/mL. Performed at [facility], [address], [place], [place] [address]   Admission on 07/14/2024, Discharged on 07/15/2024  Component Date Value Ref Range Status   Sodium 07/14/2024 139  135 - 145 mmol/L Final   Potassium 07/14/2024 4.3  3.5 - 5.1 mmol/L Final   Chloride 07/14/2024 105  98 - 111 mmol/L Final   CO2 07/14/2024 22  22 - 32 mmol/L Final   Glucose, Bld 07/14/2024 72  70 - 99 mg/dL Final   Glucose reference range applies only to samples taken after fasting for at least 8 hours.   BUN 07/14/2024 10  6 - 20 mg/dL Final   Creatinine, Ser 07/14/2024 0.64  0.44 - 1.00 mg/dL Final   Calcium 90/96/7974 9.6  8.9 - 10.3 mg/dL Final   Total Protein 90/96/7974 7.6  6.5 - 8.1 g/dL Final   Albumin 90/96/7974 4.1  3.5 - 5.0 g/dL Final   AST 90/96/7974 29  15 - 41  U/L Final   ALT 07/14/2024 25  0 - 44 U/L Final   Alkaline Phosphatase 07/14/2024 119  38 - 126 U/L Final   Total Bilirubin 07/14/2024 0.4  0.0 - 1.2 mg/dL Final   GFR, Estimated 07/14/2024 >60  >60 mL/min Final   Comment: (NOTE) Calculated using the CKD-EPI Creatinine Equation (2021)    Anion gap 07/14/2024 13  5 - 15 Final   Performed at [facility], [address] [place], [place] [address]   Alcohol, Ethyl (B) 07/14/2024 <15  <15 mg/dL Final   Comment: (NOTE) For medical purposes only. Performed at [facility], [address], [place], [place] [address]    WBC 07/14/2024 13.7 (H)  4.0 - 10.5 K/uL Final   RBC 07/14/2024 4.32  3.87 - 5.11 MIL/uL Final   Hemoglobin 07/14/2024 10.3 (L)  12.0 - 15.0 g/dL Final   HCT 90/96/7974 33.7 (L)  36.0 - 46.0 % Final   MCV 07/14/2024 78.0 (L)  80.0 - 100.0 fL Final   MCH 07/14/2024 23.8 (L)  26.0 - 34.0 pg Final   MCHC 07/14/2024 30.6  30.0 - 36.0 g/dL Final   RDW 90/96/7974 19.3 (H)  11.5 - 15.5 % Final   Platelets 07/14/2024 380  150 - 400 K/uL Final   nRBC 07/14/2024 0.0  0.0 - 0.2 % Final   Performed at [facility], [address] [place], [place] [address]   Opiates 07/14/2024 NEGATIVE  NEGATIVE Final   Cocaine 07/14/2024 POSITIVE (A)  NEGATIVE Final   Benzodiazepines 07/14/2024 NEGATIVE  NEGATIVE Final   Amphetamines 07/14/2024 NEGATIVE  NEGATIVE Final   Tetrahydrocannabinol 07/14/2024 NEGATIVE  NEGATIVE Final   Barbiturates 07/14/2024 NEGATIVE  NEGATIVE Final   Methadone Scn, Ur 07/14/2024 NEGATIVE  NEGATIVE Final   Fentanyl  07/14/2024 NEGATIVE  NEGATIVE Final  Comment: (NOTE) Drug screen is for Medical Purposes only. Positive results are preliminary only. If confirmation is needed, notify lab within 5 days.  Drug Class                 Cutoff (ng/mL) Amphetamine and metabolites 1000 Barbiturate and metabolites 200 Benzodiazepine              200 Opiates and  metabolites     300 Cocaine and metabolites     300 THC                         50 Fentanyl                     5 Methadone                   300  Trazodone  is metabolized in vivo to several metabolites,  including pharmacologically active m-CPP, which is excreted in the  urine.  Immunoassay screens for amphetamines and MDMA have potential  cross-reactivity with these compounds and may provide false positive  result.  Performed at [facility], [address], [place], [place] [address]    Acetaminophen  (Tylenol ), Serum 07/14/2024 <10 (L)  10 - 30 ug/mL Final   Comment: (NOTE) Toxic concentrations can be more effectively related to post dose interval; > 200, > 100, and > 50 ug/mL serum concentrations correspond to toxic concentrations at 4, 8, and 12 hours post dose, respectively.  Performed at [facility], [address] [place], [place] [address]     Blood Alcohol level:  Lab Results  Component Value Date   [facility] <15 11/03/2024   ETH <15 11/03/2024    Metabolic Disorder Labs: Lab Results  Component Value Date   HGBA1C 5.2 11/03/2024   MPG 102.54 11/03/2024   MPG 105.41 11/03/2024   No results found for: PROLACTIN Lab Results  Component Value Date   CHOL 187 11/03/2024   TRIG 68 11/03/2024   HDL 84 11/03/2024   CHOLHDL 2.2 11/03/2024   VLDL 14 11/03/2024   LDLCALC 89 11/03/2024   LDLCALC 99 11/03/2024    Therapeutic Lab Levels: No results found for: LITHIUM No results found for: VALPROATE No results found for: CBMZ  Physical Findings   AIMS    Flowsheet Row [facility] Ad[facility] ([facility]) [facility]  AIMS Total Score 0 0   AUDIT    Flowsheet Row Ad[facility]) [facility] Admission (Discharged) [facility] Admission (Discharged) [facility]  Alcohol Use Disorder Identification Test Final Score (AUDIT) 25 34 5   PHQ2-9    Flowsheet Row ED from 11/03/2024 in [facility] ED from 07/15/2024 in [facility] Office Visit from 06/16/2020 in [facility] [place] ED from 06/15/2020 in [facility] Emergency Department at [facility]  PHQ-2 Total Score 6 6 4 2   PHQ-9 Total Score 23 27 17 9    Flowsheet Row ED from 11/03/2024 in [facility] Most recent reading at 11/03/2024 11:55 PM ED from 11/03/2024 in [facility] Most recent reading at 11/03/2024 12:43 PM ED from 07/15/2024 in  [facility] Most recent reading at 07/15/2024  6:40 AM  C-SSRS RISK CATEGORY Moderate Risk No Risk High Risk     Musculoskeletal  Strength & Muscle Tone: within normal limits Gait & Station: normal Patient leans: N/A  Psychiatric Specialty Exam  Appearance:  AAF, appearing stated age,  wearing appropriate to the situation hospital clothes, with fair grooming and hygiene. Normal level of alertness and appropriate facial expression.   Attitude/Behavior: calm, cooperative, engaging with appropriate eye contact.   Motor: WNL; dyskinesias not evident. Gait appears in full range.   Speech: spontaneous, clear, coherent, normal comprehension.   Mood: okay.   Affect: appropriately-reactive, full range.   Thought process: patient appears coherent, organized, logical, goal-directed.   Thought content: patient denies suicidal thoughts, denies homicidal thoughts; did not express any delusions.   Thought perception: patient denies auditory and visual hallucinations. Did not appear internally stimulated.   Cognition: patient is alert and  oriented in self, place, date; with intact attention and concentration.   Insight: fair.   Judgement: fair.      Assets  Assets: Manufacturing Systems Engineer; Desire for Improvement; Financial Resources/Insurance; Housing   Sleep  Sleep: No data recorded  Estimated Sleeping Duration (Last 24 Hours): 16.50-17.50 hours  No data recorded   Physical Exam  Physical Exam ROS Blood pressure (!) 150/98, pulse 73, temperature (!) 97.5 F (36.4 C), temperature source Oral, resp. rate 20, SpO2 100%. There is no height or weight on file to calculate BMI.  Treatment Plan Summary: Daily contact with patient to assess and evaluate symptoms and progress in treatment and Medication management  53yo F with pash h/o substance use disorder (cocaine, alcohol), depression, anxiety, admitted to [facility] for mood stabilization and substance use treatment. Patient demonstrates mood improvement and no current symptoms of substance withdrawal. She was accepted to [facility] [place] for the residential addition treatment.   Impression: Cocaine use disorder Alcohol use disorder Substance-induced mood disorder.   Plan: -continue admission to [facility]   -CIWA discontinued.   -continue Abilify  5mg  po daily for mood stabilization -continue Remeron  7.5mg  PO at bedtime for sleep, depression. -continue Trazodone  100mg  PO at bedtime PRN insomnia.  -continue Hydroxyzine  25mg  PO TID PRN anxiety.   -cont. Amlodipine  10mg  po daily for HTN -cont Losartan  100mg  po daily for HTN -cont Gabapentin  300mg  PO TID for polyneuropathy -cont Levothyroxine  50mcg po daily for hypothyroidism -cont Nicotine  transdermal patch 21mg  daily for tobacco cravings -continue Marcobid (Nitrofurantoin ) 100mg  PO Q12h for 4 more days for UTI sx   Other PRNs: acetaminophen , alum & mag hydroxide-simeth, cloNIDine , haloperidol  **AND** diphenhydrAMINE , haloperidol  lactate **AND** diphenhydrAMINE  **AND** LORazepam , haloperidol  lactate **AND**  diphenhydrAMINE  **AND** LORazepam , hydrOXYzine , magnesium  hydroxide, traZODone    Dispo: accepted to [facility] [place] with discharge on 12/27 or 12/28.  Neil Appl, MD 11/05/2024 11:03 AM

## 2024-11-05 NOTE — ED Notes (Signed)
 Patient is in the bedroom cam and Sleeping.  NAD

## 2024-11-06 DIAGNOSIS — F411 Generalized anxiety disorder: Secondary | ICD-10-CM | POA: Diagnosis not present

## 2024-11-06 DIAGNOSIS — F1410 Cocaine abuse, uncomplicated: Secondary | ICD-10-CM | POA: Diagnosis not present

## 2024-11-06 DIAGNOSIS — F1090 Alcohol use, unspecified, uncomplicated: Secondary | ICD-10-CM | POA: Diagnosis not present

## 2024-11-06 DIAGNOSIS — F321 Major depressive disorder, single episode, moderate: Secondary | ICD-10-CM | POA: Diagnosis not present

## 2024-11-06 MED ORDER — WHITE PETROLATUM EX OINT
TOPICAL_OINTMENT | CUTANEOUS | Status: DC | PRN
  Administered 2024-11-06: 1 via TOPICAL
  Filled 2024-11-06: qty 5

## 2024-11-06 MED ORDER — MIRTAZAPINE 15 MG PO TABS
15.0000 mg | ORAL_TABLET | Freq: Every day | ORAL | Status: DC
  Administered 2024-11-06 – 2024-11-07 (×2): 15 mg via ORAL
  Filled 2024-11-06: qty 14
  Filled 2024-11-06 (×2): qty 1

## 2024-11-06 MED ORDER — MELATONIN 5 MG PO TABS
5.0000 mg | ORAL_TABLET | Freq: Every day | ORAL | Status: DC
  Administered 2024-11-06 – 2024-11-07 (×2): 5 mg via ORAL
  Filled 2024-11-06 (×3): qty 1

## 2024-11-06 MED ORDER — FLUTICASONE PROPIONATE 50 MCG/ACT NA SUSP
1.0000 | Freq: Once | NASAL | Status: AC
  Administered 2024-11-06: 1 via NASAL
  Filled 2024-11-06: qty 16

## 2024-11-06 NOTE — ED Notes (Signed)
 Pt. is calm and cooperative, visible on the unit.  Denies SI/HI/AVH anxiety and depression.  Reports that she had poor sleep last night.  BP elevated this AM 158/74 LA, 156/94 RA.  Provider Doris NP notified.  Pt given her AM BP meds.  Denies any pain.  Pt states that she takes her amlodipine  at night time normally.  Good appetite.

## 2024-11-06 NOTE — ED Notes (Signed)
 BP continues to run high.

## 2024-11-06 NOTE — ED Notes (Signed)
 Pt sitting in dayroom interacting with peers and watching TV. No acute distress noted. Continue to monitor for safety.

## 2024-11-06 NOTE — Group Note (Signed)
 Group Topic: Recovery Basics  Group Date: 11/06/2024 Start Time: 0100 End Time: 0200 Facilitators: Nicholaus Arlyne BIRCH, NT  Department: [facility]  Number of Participants: 6  Group Focus: clarity of thought Treatment Modality:  Psychoeducation Interventions utilized were clarification and reality testing Purpose: increase insight  Name: Marie Atkins Date of Birth: January 15, 1971  MR: [id number]    Level of Participation: active Quality of Participation: cooperative Interactions with others: gave feedback Mood/Affect: positive Triggers (if applicable): none Cognition: insightful Progress: Gaining insight Response: supportive Plan: patient will be encouraged to continue recovery process  Patients Problems:  Patient Active Problem List   Diagnosis Date Noted   GAD (generalized anxiety disorder) 07/15/2024   Cocaine use 07/15/2024   Suicidal ideation 07/15/2024   Vitamin D  deficiency 12/15/2023   Severe recurrent major depressive disorder with psychotic features (HCC) 08/10/2023   Hypothyroidism 03/27/2023   Uncomplicated alcohol dependence (HCC) 03/03/2023   Cocaine use disorder (HCC) 03/03/2023   Tobacco use disorder 03/03/2023   MDD (major depressive disorder), recurrent, severe, with psychosis (HCC) 03/02/2023   Severe recurrent major depression without psychotic features (HCC) 06/24/2020   Menorrhagia 06/16/2020   Iron deficiency anemia due to chronic blood loss 06/16/2020   Proteinuria 06/16/2020   Depression 06/16/2020   Tobacco dependence due to cigarettes 06/16/2020   ASCUS with positive high risk HPV cervical 06/16/2020   Amphetamine and psychostimulant-induced psychosis with delusions (HCC)    Benign essential HTN 02/16/2016   History of bilateral tubal ligation 08/27/2014

## 2024-11-06 NOTE — ED Provider Notes (Signed)
 Behavioral Health Progress Note  Date and Time: 11/06/2024 11:29 AM Name: Marie Atkins MRN:  [id number]  HPI: Patient is a 54yo F with psych history of MDD, GAD, cocaine use disorder, alcohol use disorder, whpo was admitted to [facility] for treatment of addiction and mood stabilization.   24 hr chart review: Patient was seen today for re-evaluation. Nursing reports no events overnight. The patient has no issues with performing ADLs.  Patient has been medication compliant with medications. She reports a poor sleep quality last night. BP elevated at 150/94 today, but pt has scheduled BP meds with Clonidine  with parameters. She is asymptomatic.  Patient assessment:   Pt presents today with a depressed mood, & affect iis congruent. Her attention to personal hygiene and grooming is fair, eye contact is good, speech is clear & coherent. Thought contents are organized and logical, and pt currently denies SI/HI/AVH or paranoia. There is no evidence of delusional thoughts.  Denies first ranks and there are no overt signs of psychosis.  She continues to report having no side effects from her current medications: (Abilify , Remeron ). No current symptoms of acute alcohol withdrawal reported or noted. She reports hip pain (b/l), but states that this is chronic, and that she will f/u after discharge.   Pt reports a low energy level, low concentration level. Reports  depression as continuing to be 10 (10 being worst)-will order a Vit D level since pt has a history of low Vit D levels, and this might be a contributor to depressive symptoms.  Med Adjustments for today: Agreeable to the following adjustments: D/C Trazodone  due to complaints of worsening lethargy and c/o medication ineffectiveness. Increasing remeron  to 15 mg nightly so med can also help with insomnia-Education provided on this medication including the fact that further increases could lead to weight gain and loss of sleep effect of medication. Pt agreeable  to increasing med at this time. Added Melatonin 5 mg nightly to also help with insomnia.   DC Planning: Patient is working to CSW to secure a discharge plan which includes going to substance abuse treatment for at least 30 days. Pt shares that  she has been accepted at the [facility] residential treatment facility. We will f/u with CSW regarding tentative dc date.   Diagnosis:  Final diagnoses:  Cocaine use disorder (HCC) [F14.10]   Total Time spent with patient: 30 minutes  Past psych history, social history, family history - see H&P.   Sleep: Fair  Appetite:  Fair  Current Medications:  Current Facility-Administered Medications  Medication Dose Route Frequency Provider Last Rate Last Admin   acetaminophen  (TYLENOL ) tablet 650 mg  650 mg Oral Q6H PRN Kapoor, Sahil, MD       alum & mag hydroxide-simeth (MAALOX/MYLANTA) 200-200-20 MG/5ML suspension 30 mL  30 mL Oral Q4H PRN Kapoor, Sahil, MD       amLODipine  (NORVASC ) tablet 10 mg  10 mg Oral Daily Paliy, Alisa, MD   10 mg at 11/06/24 0855   ARIPiprazole  (ABILIFY ) tablet 5 mg  5 mg Oral Daily Paliy, Alisa, MD   5 mg at 11/06/24 0857   cloNIDine  (CATAPRES ) tablet 0.1 mg  0.1 mg Oral Q12H PRN Paliy, Alisa, MD       haloperidol  (HALDOL ) tablet 5 mg  5 mg Oral TID PRN Kapoor, Sahil, MD       And   diphenhydrAMINE  (BENADRYL ) capsule 50 mg  50 mg Oral TID PRN Kapoor, Sahil, MD       haloperidol  lactate (  HALDOL ) injection 5 mg  5 mg Intramuscular TID PRN Kapoor, Sahil, MD       And   diphenhydrAMINE  (BENADRYL ) injection 50 mg  50 mg Intramuscular TID PRN Kapoor, Sahil, MD       And   LORazepam  (ATIVAN ) injection 2 mg  2 mg Intramuscular TID PRN Kapoor, Sahil, MD       haloperidol  lactate (HALDOL ) injection 10 mg  10 mg Intramuscular TID PRN Kapoor, Sahil, MD       And   diphenhydrAMINE  (BENADRYL ) injection 50 mg  50 mg Intramuscular TID PRN Kapoor, Sahil, MD       And   LORazepam  (ATIVAN ) injection 2 mg  2 mg Intramuscular TID PRN Kapoor,  Sahil, MD       gabapentin  (NEURONTIN ) capsule 300 mg  300 mg Oral TID Paliy, Alisa, MD   300 mg at 11/06/24 9143   hydrOXYzine  (ATARAX ) tablet 25 mg  25 mg Oral Q6H PRN Paliy, Alisa, MD   25 mg at 11/05/24 2108   levothyroxine  (SYNTHROID ) tablet 50 mcg  50 mcg Oral Daily Paliy, Alisa, MD   50 mcg at 11/06/24 0856   losartan  (COZAAR ) tablet 100 mg  100 mg Oral Daily Paliy, Alisa, MD   100 mg at 11/06/24 9142   magnesium  hydroxide (MILK OF MAGNESIA) suspension 30 mL  30 mL Oral Daily PRN Kapoor, Sahil, MD   30 mL at 11/05/24 2110   melatonin tablet 5 mg  5 mg Oral QHS Priyal Musquiz, NP       mirtazapine  (REMERON ) tablet 15 mg  15 mg Oral QHS Estrellita Lasky, NP       naproxen  (NAPROSYN ) tablet 375 mg  375 mg Oral TID WC Paliy, Alisa, MD   375 mg at 11/06/24 0855   nicotine  (NICODERM CQ  - dosed in mg/24 hours) patch 21 mg  21 mg Transdermal Q0600 Kapoor, Sahil, MD       nystatin -triamcinolone  (MYCOLOG II) cream 1 Application  1 Application Topical BID Paliy, Alisa, MD   1 Application at 11/05/24 1638   Current Outpatient Medications  Medication Sig Dispense Refill   amLODipine  (NORVASC ) 10 MG tablet Take 10 mg by mouth.     busPIRone  (BUSPAR ) 5 MG tablet Take 5 mg by mouth 3 (three) times daily.     cetirizine  (ZYRTEC  ALLERGY) 10 MG tablet Take 1 tablet (10 mg total) by mouth daily. (Patient taking differently: Take 10 mg by mouth daily as needed for allergies.) 30 tablet 11   ferrous sulfate  325 (65 FE) MG tablet Take 1 tablet (325 mg total) by mouth daily. 30 tablet 1   fluticasone  (FLONASE ) 50 MCG/ACT nasal spray Place 2 sprays into both nostrils daily. (Patient taking differently: Place 2 sprays into both nostrils daily as needed for allergies.) 16 g 6   gabapentin  (NEURONTIN ) 300 MG capsule Take 300 mg by mouth 3 (three) times daily.     hydrOXYzine  (ATARAX ) 25 MG tablet Take 1 tablet (25 mg total) by mouth every 6 (six) hours as needed for anxiety. 30 tablet 0   levothyroxine  (SYNTHROID ) 50  MCG tablet Take 1 tablet (50 mcg total) by mouth daily at 6 (six) AM. 30 tablet 0   losartan  (COZAAR ) 100 MG tablet Take 1 tablet (100 mg total) by mouth daily. 30 tablet 0   melatonin 3 MG TABS tablet Take 3 mg by mouth at bedtime as needed (sleep).     meloxicam  (MOBIC ) 15 MG tablet Take 15 mg by mouth  daily.     pantoprazole  (PROTONIX ) 40 MG tablet Take 1 tablet (40 mg total) by mouth daily. 30 tablet 1   sertraline  (ZOLOFT ) 50 MG tablet Take 1 tablet (50 mg total) by mouth daily. 30 tablet 0   traZODone  (DESYREL ) 50 MG tablet Take 100 mg by mouth at bedtime as needed for sleep.     Vitamin D , Ergocalciferol , (DRISDOL ) 1.25 MG (50000 UNIT) CAPS capsule Take 1 capsule (50,000 Units total) by mouth every 7 (seven) days. 4 capsule 2    Labs  Lab Results:  Admission on 11/03/2024  Component Date Value Ref Range Status   WBC 11/03/2024 10.0  4.0 - 10.5 K/uL Final   RBC 11/03/2024 4.51  3.87 - 5.11 MIL/uL Final   Hemoglobin 11/03/2024 11.2 (L)  12.0 - 15.0 g/dL Final   HCT 87/75/7974 36.2  36.0 - 46.0 % Final   MCV 11/03/2024 80.3  80.0 - 100.0 fL Final   MCH 11/03/2024 24.8 (L)  26.0 - 34.0 pg Final   MCHC 11/03/2024 30.9  30.0 - 36.0 g/dL Final   RDW 87/75/7974 20.4 (H)  11.5 - 15.5 % Final   Platelets 11/03/2024 311  150 - 400 K/uL Final   nRBC 11/03/2024 0.0  0.0 - 0.2 % Final   Neutrophils Relative % 11/03/2024 58  % Final   Neutro Abs 11/03/2024 5.8  1.7 - 7.7 K/uL Final   Lymphocytes Relative 11/03/2024 32  % Final   Lymphs Abs 11/03/2024 3.2  0.7 - 4.0 K/uL Final   Monocytes Relative 11/03/2024 8  % Final   Monocytes Absolute 11/03/2024 0.8  0.1 - 1.0 K/uL Final   Eosinophils Relative 11/03/2024 1  % Final   Eosinophils Absolute 11/03/2024 0.1  0.0 - 0.5 K/uL Final   Basophils Relative 11/03/2024 1  % Final   Basophils Absolute 11/03/2024 0.1  0.0 - 0.1 K/uL Final   Immature Granulocytes 11/03/2024 0  % Final   Abs Immature Granulocytes 11/03/2024 0.03  0.00 - 0.07 K/uL Final    Performed at [facility], [address] [place], [place] [address]   Sodium 11/03/2024 138  135 - 145 mmol/L Final   Potassium 11/03/2024 4.2  3.5 - 5.1 mmol/L Final   Chloride 11/03/2024 103  98 - 111 mmol/L Final   CO2 11/03/2024 24  22 - 32 mmol/L Final   Glucose, Bld 11/03/2024 106 (H)  70 - 99 mg/dL Final   Glucose reference range applies only to samples taken after fasting for at least 8 hours.   BUN 11/03/2024 15  6 - 20 mg/dL Final   Creatinine, Ser 11/03/2024 0.94  0.44 - 1.00 mg/dL Final   Calcium 87/75/7974 9.3  8.9 - 10.3 mg/dL Final   Total Protein 87/75/7974 7.4  6.5 - 8.1 g/dL Final   Albumin 87/75/7974 4.0  3.5 - 5.0 g/dL Final   AST 87/75/7974 22  15 - 41 U/L Final   ALT 11/03/2024 20  0 - 44 U/L Final   Alkaline Phosphatase 11/03/2024 112  38 - 126 U/L Final   Total Bilirubin 11/03/2024 0.3  0.0 - 1.2 mg/dL Final   GFR, Estimated 11/03/2024 >60  >60 mL/min Final   Comment: (NOTE) Calculated using the CKD-EPI Creatinine Equation (2021)    Anion gap 11/03/2024 11  5 - 15 Final   Performed at [facility], [address] [place], [place] [address]   Alcohol, Ethyl (B) 11/03/2024 <15  <15 mg/dL Final  Comment: (NOTE) For medical purposes only. Performed at [facility], [address] [place], [place] [address]    Hgb A1c MFr Bld 11/03/2024 5.2  4.8 - 5.6 % Final   Comment: (NOTE) Diagnosis of Diabetes The following HbA1c ranges recommended by the American Diabetes Association (ADA) may be used as an aid in the diagnosis of diabetes mellitus.  Hemoglobin             Suggested A1C NGSP%              Diagnosis  <5.7                   Non Diabetic  5.7-6.4                Pre-Diabetic  >6.4                   Diabetic  <7.0                   Glycemic control for                       adults with diabetes.     Mean Plasma Glucose 11/03/2024 102.54  mg/dL Final   Performed at [facility], [address], [place], [place]  [address]   Cholesterol 11/03/2024 187  0 - 200 mg/dL Final   Comment:        ATP III CLASSIFICATION:  <200     mg/dL   Desirable  799-760  mg/dL   Borderline High  >=759    mg/dL   High           Triglycerides 11/03/2024 68  <150 mg/dL Final   HDL 87/75/7974 84  >40 mg/dL Final   Total CHOL/HDL Ratio 11/03/2024 2.2  RATIO Final   VLDL 11/03/2024 14  0 - 40 mg/dL Final   LDL Cholesterol 11/03/2024 89  0 - 99 mg/dL Final   Comment:        Total Cholesterol/HDL:CHD Risk Coronary Heart Disease Risk Table                     Men   Women  1/2 Average Risk   3.4   3.3  Average Risk       5.0   4.4  2 X Average Risk   9.6   7.1  3 X Average Risk  23.4   11.0        Use the calculated Patient Ratio above and the CHD Risk Table to determine the patient's CHD Risk.        ATP III CLASSIFICATION (LDL):  <100     mg/dL   Optimal  899-870  mg/dL   Near or Above                    Optimal  130-159  mg/dL   Borderline  839-810  mg/dL   High  >809     mg/dL   Very High Performed at [facility], [address] [place], [place] [address]    TSH 11/03/2024 1.060  0.350 - 4.500 uIU/mL Final   Performed at [facility], [address], [place], [place] [address]  Admission on 11/03/2024, Discharged on 11/03/2024  Component Date Value Ref Range Status   SARS Coronavirus 2 by RT PCR 11/03/2024 NEGATIVE  NEGATIVE Final   Performed at [facility], [address] [place], [place] [address]   WBC 11/03/2024 10.7 (H)  4.0 - 10.5 K/uL Final   RBC 11/03/2024 4.80  3.87 - 5.11 MIL/uL Final   Hemoglobin 11/03/2024 12.0  12.0 - 15.0 g/dL Final   HCT 87/75/7974 38.4  36.0 - 46.0 % Final   MCV 11/03/2024 80.0  80.0 - 100.0 fL Final   MCH 11/03/2024 25.0 (L)  26.0 - 34.0 pg Final   MCHC 11/03/2024 31.3  30.0 - 36.0 g/dL Final   RDW 87/75/7974 20.6 (H)  11.5 - 15.5 % Final   Platelets 11/03/2024 323  150 - 400 K/uL Final   nRBC 11/03/2024 0.0  0.0 - 0.2 % Final   Neutrophils Relative  % 11/03/2024 66  % Final   Neutro Abs 11/03/2024 7.2  1.7 - 7.7 K/uL Final   Lymphocytes Relative 11/03/2024 22  % Final   Lymphs Abs 11/03/2024 2.3  0.7 - 4.0 K/uL Final   Monocytes Relative 11/03/2024 10  % Final   Monocytes Absolute 11/03/2024 1.0  0.1 - 1.0 K/uL Final   Eosinophils Relative 11/03/2024 1  % Final   Eosinophils Absolute 11/03/2024 0.1  0.0 - 0.5 K/uL Final   Basophils Relative 11/03/2024 1  % Final   Basophils Absolute 11/03/2024 0.1  0.0 - 0.1 K/uL Final   Immature Granulocytes 11/03/2024 0  % Final   Abs Immature Granulocytes 11/03/2024 0.03  0.00 - 0.07 K/uL Final   Performed at [facility], [address], [place], [place] [address]   Sodium 11/03/2024 137  135 - 145 mmol/L Final   Potassium 11/03/2024 4.6  3.5 - 5.1 mmol/L Final   Chloride 11/03/2024 104  98 - 111 mmol/L Final   CO2 11/03/2024 24  22 - 32 mmol/L Final   Glucose, Bld 11/03/2024 89  70 - 99 mg/dL Final   Glucose reference range applies only to samples taken after fasting for at least 8 hours.   BUN 11/03/2024 11  6 - 20 mg/dL Final   Creatinine, Ser 11/03/2024 0.81  0.44 - 1.00 mg/dL Final   Calcium 87/75/7974 9.3  8.9 - 10.3 mg/dL Final   Total Protein 87/75/7974 7.6  6.5 - 8.1 g/dL Final   Albumin 87/75/7974 4.3  3.5 - 5.0 g/dL Final   AST 87/75/7974 18  15 - 41 U/L Final   ALT 11/03/2024 21  0 - 44 U/L Final   Alkaline Phosphatase 11/03/2024 126  38 - 126 U/L Final   Total Bilirubin 11/03/2024 0.3  0.0 - 1.2 mg/dL Final   GFR, Estimated 11/03/2024 >60  >60 mL/min Final   Comment: (NOTE) Calculated using the CKD-EPI Creatinine Equation (2021)    Anion gap 11/03/2024 9  5 - 15 Final   Performed at [facility], [address], [place], [place] [address]   Hgb A1c MFr Bld 11/03/2024 5.3  4.8 - 5.6 % Final   Comment: (NOTE) Diagnosis of Diabetes The following HbA1c ranges recommended by the American Diabetes Association (ADA) may be used as an aid in the diagnosis of diabetes  mellitus.  Hemoglobin             Suggested A1C NGSP%              Diagnosis  <5.7                   Non Diabetic  5.7-6.4  Pre-Diabetic  >6.4                   Diabetic  <7.0                   Glycemic control for                       adults with diabetes.     Mean Plasma Glucose 11/03/2024 105.41  mg/dL Final   Performed at [facility], [address], [place], [place] [address]   Alcohol, Ethyl (B) 11/03/2024 <15  <15 mg/dL Final   Comment: (NOTE) For medical purposes only. Performed at [facility], [address], [place], [place] [address]    Cholesterol 11/03/2024 204 (H)  0 - 200 mg/dL Final   Comment:        ATP III CLASSIFICATION:  <200     mg/dL   Desirable  799-760  mg/dL   Borderline High  >=759    mg/dL   High           Triglycerides 11/03/2024 81  <150 mg/dL Final   HDL 87/75/7974 89  >40 mg/dL Final   Total CHOL/HDL Ratio 11/03/2024 2.3  RATIO Final   VLDL 11/03/2024 16  0 - 40 mg/dL Final   LDL Cholesterol 11/03/2024 99  0 - 99 mg/dL Final   Comment:        Total Cholesterol/HDL:CHD Risk Coronary Heart Disease Risk Table                     Men   Women  1/2 Average Risk   3.4   3.3  Average Risk       5.0   4.4  2 X Average Risk   9.6   7.1  3 X Average Risk  23.4   11.0        Use the calculated Patient Ratio above and the CHD Risk Table to determine the patient's CHD Risk.        ATP III CLASSIFICATION (LDL):  <100     mg/dL   Optimal  899-870  mg/dL   Near or Above                    Optimal  130-159  mg/dL   Borderline  839-810  mg/dL   High  >809     mg/dL   Very High Performed at [facility], [address] [place], [place] [address]    TSH 11/03/2024 1.080  0.350 - 4.500 uIU/mL Final   Performed at [facility], [address] [place], [place] [address]   Color, Urine 11/03/2024 YELLOW  YELLOW Final   APPearance 11/03/2024 CLOUDY (A)  CLEAR Final   Specific Gravity, Urine 11/03/2024 1.026   1.005 - 1.030 Final   pH 11/03/2024 5.0  5.0 - 8.0 Final   Glucose, UA 11/03/2024 NEGATIVE  NEGATIVE mg/dL Final   Hgb urine dipstick 11/03/2024 NEGATIVE  NEGATIVE Final   Bilirubin Urine 11/03/2024 NEGATIVE  NEGATIVE Final   Ketones, ur 11/03/2024 NEGATIVE  NEGATIVE mg/dL Final   Protein, ur 87/75/7974 30 (A)  NEGATIVE mg/dL Final   Nitrite 87/75/7974 NEGATIVE  NEGATIVE Final   Leukocytes,Ua 11/03/2024 TRACE (A)  NEGATIVE Final   RBC / HPF 11/03/2024 0-5  0 - 5 RBC/hpf Final   WBC, UA 11/03/2024 0-5  0 - 5 WBC/hpf Final  Bacteria, UA 11/03/2024 RARE (A)  NONE SEEN Final   Squamous Epithelial / HPF 11/03/2024 11-20  0 - 5 /HPF Final   Mucus 11/03/2024 PRESENT   Final   Performed at [facility], [address] [place], [place] [address]   POC Amphetamine UR 11/03/2024 None Detected  NONE DETECTED (Cut Off Level 1000 ng/mL) Final   POC Secobarbital (BAR) 11/03/2024 None Detected  NONE DETECTED (Cut Off Level 300 ng/mL) Final   POC Buprenorphine (BUP) 11/03/2024 None Detected  NONE DETECTED (Cut Off Level 10 ng/mL) Final   POC Oxazepam (BZO) 11/03/2024 None Detected  NONE DETECTED (Cut Off Level 300 ng/mL) Final   POC Cocaine UR 11/03/2024 Positive (A)  NONE DETECTED (Cut Off Level 300 ng/mL) Final   POC Methamphetamine UR 11/03/2024 None Detected  NONE DETECTED (Cut Off Level 1000 ng/mL) Final   POC Morphine  11/03/2024 None Detected  NONE DETECTED (Cut Off Level 300 ng/mL) Final   POC Methadone UR 11/03/2024 None Detected  NONE DETECTED (Cut Off Level 300 ng/mL) Final   POC Oxycodone UR 11/03/2024 None Detected  NONE DETECTED (Cut Off Level 100 ng/mL) Final   POC Marijuana UR 11/03/2024 None Detected  NONE DETECTED (Cut Off Level 50 ng/mL) Final  Admission on 07/15/2024, Discharged on 07/17/2024  Component Date Value Ref Range Status   Preg Test, Ur 07/15/2024 Negative  Negative Final   TSH 07/15/2024 0.727  0.350 - 4.500 uIU/mL Final   Comment: Performed by a 3rd Generation assay  with a functional sensitivity of <=0.01 uIU/mL. Performed at [facility], [address], [place], [place] [address]   Admission on 07/14/2024, Discharged on 07/15/2024  Component Date Value Ref Range Status   Sodium 07/14/2024 139  135 - 145 mmol/L Final   Potassium 07/14/2024 4.3  3.5 - 5.1 mmol/L Final   Chloride 07/14/2024 105  98 - 111 mmol/L Final   CO2 07/14/2024 22  22 - 32 mmol/L Final   Glucose, Bld 07/14/2024 72  70 - 99 mg/dL Final   Glucose reference range applies only to samples taken after fasting for at least 8 hours.   BUN 07/14/2024 10  6 - 20 mg/dL Final   Creatinine, Ser 07/14/2024 0.64  0.44 - 1.00 mg/dL Final   Calcium 90/96/7974 9.6  8.9 - 10.3 mg/dL Final   Total Protein 90/96/7974 7.6  6.5 - 8.1 g/dL Final   Albumin 90/96/7974 4.1  3.5 - 5.0 g/dL Final   AST 90/96/7974 29  15 - 41 U/L Final   ALT 07/14/2024 25  0 - 44 U/L Final   Alkaline Phosphatase 07/14/2024 119  38 - 126 U/L Final   Total Bilirubin 07/14/2024 0.4  0.0 - 1.2 mg/dL Final   GFR, Estimated 07/14/2024 >60  >60 mL/min Final   Comment: (NOTE) Calculated using the CKD-EPI Creatinine Equation (2021)    Anion gap 07/14/2024 13  5 - 15 Final   Performed at [facility], [address] [place], [place] [address]   Alcohol, Ethyl (B) 07/14/2024 <15  <15 mg/dL Final   Comment: (NOTE) For medical purposes only. Performed at [facility], [address] [place], [place] [address]    WBC 07/14/2024 13.7 (H)  4.0 - 10.5 K/uL Final   RBC 07/14/2024 4.32  3.87 - 5.11 MIL/uL Final   Hemoglobin 07/14/2024 10.3 (L)  12.0 - 15.0 g/dL Final   HCT 90/96/7974 33.7 (L)  36.0 - 46.0 % Final  MCV 07/14/2024 78.0 (L)  80.0 - 100.0 fL Final   MCH 07/14/2024 23.8 (L)  26.0 - 34.0 pg Final   MCHC 07/14/2024 30.6  30.0 - 36.0 g/dL Final   RDW 90/96/7974 19.3 (H)  11.5 - 15.5 % Final   Platelets 07/14/2024 380  150 - 400 K/uL Final   nRBC 07/14/2024 0.0  0.0 - 0.2 %  Final   Performed at [facility], [address], [place], [place] [address]   Opiates 07/14/2024 NEGATIVE  NEGATIVE Final   Cocaine 07/14/2024 POSITIVE (A)  NEGATIVE Final   Benzodiazepines 07/14/2024 NEGATIVE  NEGATIVE Final   Amphetamines 07/14/2024 NEGATIVE  NEGATIVE Final   Tetrahydrocannabinol 07/14/2024 NEGATIVE  NEGATIVE Final   Barbiturates 07/14/2024 NEGATIVE  NEGATIVE Final   Methadone Scn, Ur 07/14/2024 NEGATIVE  NEGATIVE Final   Fentanyl  07/14/2024 NEGATIVE  NEGATIVE Final   Comment: (NOTE) Drug screen is for Medical Purposes only. Positive results are preliminary only. If confirmation is needed, notify lab within 5 days.  Drug Class                 Cutoff (ng/mL) Amphetamine and metabolites 1000 Barbiturate and metabolites 200 Benzodiazepine              200 Opiates and metabolites     300 Cocaine and metabolites     300 THC                         50 Fentanyl                     5 Methadone                   300  Trazodone  is metabolized in vivo to several metabolites,  including pharmacologically active m-CPP, which is excreted in the  urine.  Immunoassay screens for amphetamines and MDMA have potential  cross-reactivity with these compounds and may provide false positive  result.  Performed at [facility], [address], [place], [place] [address]    Acetaminophen  (Tylenol ), Serum 07/14/2024 <10 (L)  10 - 30 ug/mL Final   Comment: (NOTE) Toxic concentrations can be more effectively related to post dose interval; > 200, > 100, and > 50 ug/mL serum concentrations correspond to toxic concentrations at 4, 8, and 12 hours post dose, respectively.  Performed at [facility], [address] [place], [place] [address]     Blood Alcohol level:  Lab Results  Component Value Date   [facility] <15 11/03/2024   ETH <15 11/03/2024    Metabolic Disorder Labs: Lab Results  Component Value Date   HGBA1C 5.2  11/03/2024   MPG 102.54 11/03/2024   MPG 105.41 11/03/2024   No results found for: PROLACTIN Lab Results  Component Value Date   CHOL 187 11/03/2024   TRIG 68 11/03/2024   HDL 84 11/03/2024   CHOLHDL 2.2 11/03/2024   VLDL 14 11/03/2024   LDLCALC 89 11/03/2024   LDLCALC 99 11/03/2024    Therapeutic Lab Levels: No results found for: LITHIUM No results found for: VALPROATE No results found for: CBMZ  Physical Findings   AIMS    Flowsheet Row [facility] Ad[facility] ([facility]) [facility]  AIMS Total Score 0 0   AUDIT    Flowsheet Row [facility]) [facility] Admission (Discharged) [facility] Admission (Discharged) [facility]  Alcohol Use Disorder Identification Test Final Score (AUDIT) 25 34 5   PHQ2-9    Flowsheet Row ED from 11/03/2024 in [facility] Most recent reading at 11/06/2024 10:58 AM ED from 11/03/2024 in [facility] Most recent reading at 11/03/2024  1:54 PM ED from 07/15/2024 in [facility] Most recent reading at 07/15/2024  5:25 AM Office Visit from 06/16/2020 in [facility] [place] Most recent reading at 06/16/2020  4:08 PM ED from 06/15/2020 in [facility] Emergency Department at [facility] Most recent reading at 06/15/2020  9:56 AM  PHQ-2 Total Score 0 6 6 4 2   PHQ-9 Total Score 0 23 27 17 9    Flowsheet Row ED from 11/03/2024 in [facility] Most recent reading at 11/03/2024 11:55 PM ED from 11/03/2024 in [facility] Most recent reading at 11/03/2024 12:43 PM ED from  07/15/2024 in [facility] Most recent reading at 07/15/2024  6:40 AM  C-SSRS RISK CATEGORY Moderate Risk No Risk High Risk     Musculoskeletal  Strength & Muscle Tone: within normal limits Gait & Station: normal Patient leans: N/A  Psychiatric Specialty Exam  Appearance:  AAF, appearing stated age,  wearing appropriate to the situation hospital clothes, with fair grooming and hygiene. Normal level of alertness and appropriate facial expression.   Attitude/Behavior: calm, cooperative, engaging with appropriate eye contact.   Motor: WNL; dyskinesias not evident. Gait appears in full range.   Speech: spontaneous, clear, coherent, normal comprehension.   Mood: okay.   Affect: appropriately-reactive, full range.   Thought process: patient appears coherent, organized, logical, goal-directed.   Thought content: patient denies suicidal thoughts, denies homicidal thoughts; did not express any delusions.   Thought perception: patient denies auditory and visual hallucinations. Did not appear internally stimulated.   Cognition: patient is alert and oriented in self, place, date; with intact attention and concentration.   Insight: fair.   Judgement: fair.      Assets  Assets: Resilience   Sleep  Sleep: Sleep: Poor   Estimated Sleeping Duration (Last 24 Hours): 15.75-17.25 hours  Nutritional Assessment (For OBS and [facility] admissions only) Has the patient had a weight loss or gain of 10 pounds or more in the last 3 months?: No Has the patient had a decrease in food intake/or appetite?: No Does the patient have dental problems?: No Does the patient have eating habits or behaviors that may be indicators of an eating disorder including binging or inducing vomiting?: No Has the patient recently lost weight without trying?: 0 Has the patient been eating poorly because of a decreased appetite?: 0 Malnutrition Screening Tool Score: 0     Physical Exam   Physical Exam Vitals and nursing note reviewed.  HENT:     Head: Normocephalic.  Eyes:     Pupils: Pupils are equal, round, and reactive to light.  Musculoskeletal:     Cervical back: Normal range of motion.  Neurological:     General: No focal deficit present.     Mental Status: She is oriented to person, place, and time.  Psychiatric:        Behavior: Behavior normal.        Thought Content:  Thought content normal.    Review of Systems  Psychiatric/Behavioral:  Positive for depression and substance abuse. Negative for hallucinations, memory loss and suicidal ideas. The patient is nervous/anxious and has insomnia.   All other systems reviewed and are negative.  Blood pressure (!) 154/94, pulse 70, temperature 98 F (36.7 C), temperature source Oral, resp. rate 18, SpO2 97%. There is no height or weight on file to calculate BMI.  Treatment Plan Summary: Daily contact with patient to assess and evaluate symptoms and progress in treatment and Medication management  53yo F with pash h/o substance use disorder (cocaine, alcohol), depression, anxiety, admitted to [facility] for mood stabilization and substance use treatment. Patient demonstrates mood improvement and no current symptoms of substance withdrawal. She was accepted to [facility] for the residential addition treatment.   Impression: Cocaine use disorder Alcohol use disorder Substance-induced mood disorder.   Plan: -continue admission to [facility]   -CIWA discontinued.   -continue Abilify  5mg  po daily for mood stabilization -Increase Remeron  from 7.5mg  PO to 15 mg at bedtime for sleep, depression. -Discontinue Trazodone  100mg  PO due to lack of efficacy & c/o dizziness -Start Melatonin 5 mg nightly for sleep -continue Hydroxyzine  25mg  PO TID PRN anxiety.   -cont. Amlodipine  10mg  po daily for HTN -cont Losartan  100mg  po daily for HTN -cont Gabapentin  300mg  PO TID for polyneuropathy -cont Levothyroxine  50mcg po daily  for hypothyroidism -cont Nicotine  transdermal patch 21mg  daily for tobacco cravings -continue Marcobid (Nitrofurantoin ) 100mg  PO Q12h for 4 more days for UTI sx   Other PRNs: acetaminophen , alum & mag hydroxide-simeth, cloNIDine , haloperidol  **AND** diphenhydrAMINE , haloperidol  lactate **AND** diphenhydrAMINE  **AND** LORazepam , haloperidol  lactate **AND** diphenhydrAMINE  **AND** LORazepam , hydrOXYzine , magnesium  hydroxide   Dispo: Per CSW's note: Writer coordinated and intake interview for Monday 11/08/24 at 9:00 am.    [facility] - [facility] [address] [place], [place] [address] Phone: [phone]. Discharge date will therefore be 12/29 so as to enable a door to door transfer to mitigate the risk of relapsing.   Donia Snell, NP 11/06/2024 11:29 AM

## 2024-11-06 NOTE — Group Note (Signed)
 Group Topic: Recovery Basics  Group Date: 11/06/2024 Start Time: 2000 End Time: 2030 Facilitators: Verdon Jacqualyn BRAVO, NT  Department: [facility]  Number of Participants: 9  Group Focus: coping skills Treatment Modality:  Individual Therapy Interventions utilized were group exercise Purpose: relapse prevention strategies  Name: Marie Atkins Date of Birth: Nov 12, 1970  MR: [id number]    Level of Participation: active Quality of Participation: cooperative Interactions with others: gave feedback Mood/Affect: appropriate Triggers (if applicable): n/a Cognition: coherent/clear Progress: Moderate Response: n/a Plan: follow-up needed  Patients Problems:  Patient Active Problem List   Diagnosis Date Noted   GAD (generalized anxiety disorder) 07/15/2024   Cocaine use 07/15/2024   Suicidal ideation 07/15/2024   Vitamin D  deficiency 12/15/2023   Severe recurrent major depressive disorder with psychotic features (HCC) 08/10/2023   Hypothyroidism 03/27/2023   Uncomplicated alcohol dependence (HCC) 03/03/2023   Cocaine use disorder (HCC) 03/03/2023   Tobacco use disorder 03/03/2023   MDD (major depressive disorder), recurrent, severe, with psychosis (HCC) 03/02/2023   Severe recurrent major depression without psychotic features (HCC) 06/24/2020   Menorrhagia 06/16/2020   Iron deficiency anemia due to chronic blood loss 06/16/2020   Proteinuria 06/16/2020   Depression 06/16/2020   Tobacco dependence due to cigarettes 06/16/2020   ASCUS with positive high risk HPV cervical 06/16/2020   Amphetamine and psychostimulant-induced psychosis with delusions (HCC)    Benign essential HTN 02/16/2016   History of bilateral tubal ligation 08/27/2014

## 2024-11-06 NOTE — ED Notes (Signed)
 Pt denies SI/HI/AVH. No reports of pain. Compliant with scheduled meds; prn atarax  and milk of magnesia given. She was calm, cooperative, and pleasant. No behavioral concerns at this time. Pt sleeping in bed, no acute distress noted. Respirations even and unlabored. Continue to monitor for safety.

## 2024-11-06 NOTE — ED Notes (Signed)
 Pt denies SI/HI/AVH. No reports of pain. Compliant with scheduled meds; prn atarax  given. She was calm, cooperative, and pleasant. Pt attended AA meeting tonight. No behavioral concerns at this time. Pt sleeping in bed, no acute distress noted. Respirations even and unlabored. Continue to monitor for safety.

## 2024-11-06 NOTE — ED Notes (Signed)
 Pt sleeping in bed, no acute distress noted. Respirations even and unlabored. Continue to monitor for safety.

## 2024-11-06 NOTE — Group Note (Signed)
 Group Topic: Recovery Basics  Group Date: 11/06/2024 Start Time: 1000 End Time: 1030 Facilitators: Keanu Frickey, Zane HERO, RN; Vincente Asberry CROME, RN  Department: [facility]  Number of Participants: 1  Group Focus: nursing group Treatment Modality:  Individual Therapy Interventions utilized were patient education Purpose: increase insight  Name: Marie Atkins Date of Birth: 1971/04/05  MR: [id number]    Level of Participation: moderate Quality of Participation: attentive and cooperative Interactions with others: gave feedback Mood/Affect: appropriate Triggers (if applicable): None identified at this time Cognition: coherent/clear and logical Progress: Gaining insight Response: Patient voiced understanding of treatment plan at this time. No questions voiced, aware of who to contact if future questions arise. Plan: patient will be encouraged to continue to attend groups on the unit  Patients Problems:  Patient Active Problem List   Diagnosis Date Noted   GAD (generalized anxiety disorder) 07/15/2024   Cocaine use 07/15/2024   Suicidal ideation 07/15/2024   Vitamin D  deficiency 12/15/2023   Severe recurrent major depressive disorder with psychotic features (HCC) 08/10/2023   Hypothyroidism 03/27/2023   Uncomplicated alcohol dependence (HCC) 03/03/2023   Cocaine use disorder (HCC) 03/03/2023   Tobacco use disorder 03/03/2023   MDD (major depressive disorder), recurrent, severe, with psychosis (HCC) 03/02/2023   Severe recurrent major depression without psychotic features (HCC) 06/24/2020   Menorrhagia 06/16/2020   Iron deficiency anemia due to chronic blood loss 06/16/2020   Proteinuria 06/16/2020   Depression 06/16/2020   Tobacco dependence due to cigarettes 06/16/2020   ASCUS with positive high risk HPV cervical 06/16/2020   Amphetamine and psychostimulant-induced psychosis with delusions (HCC)    Benign essential HTN 02/16/2016   History of  bilateral tubal ligation 08/27/2014

## 2024-11-06 NOTE — ED Notes (Signed)
 Pt is in her room resting quietly appears to be asleep Chest rising and falling easily.

## 2024-11-06 NOTE — ED Notes (Signed)
 BP continues to run high.  No withdrawal symptoms except mild anxiety.  Messaged left in secure chat reto BP.  Clonidine  PRN given

## 2024-11-07 DIAGNOSIS — F411 Generalized anxiety disorder: Secondary | ICD-10-CM | POA: Diagnosis not present

## 2024-11-07 DIAGNOSIS — F321 Major depressive disorder, single episode, moderate: Secondary | ICD-10-CM | POA: Diagnosis not present

## 2024-11-07 DIAGNOSIS — F1090 Alcohol use, unspecified, uncomplicated: Secondary | ICD-10-CM | POA: Diagnosis not present

## 2024-11-07 DIAGNOSIS — F1410 Cocaine abuse, uncomplicated: Secondary | ICD-10-CM | POA: Diagnosis not present

## 2024-11-07 LAB — VITAMIN B12: Vitamin B-12: 890 pg/mL (ref 180–914)

## 2024-11-07 LAB — VITAMIN D 25 HYDROXY (VIT D DEFICIENCY, FRACTURES): Vit D, 25-Hydroxy: 15.7 ng/mL — ABNORMAL LOW (ref 30–100)

## 2024-11-07 MED ORDER — NICOTINE 21 MG/24HR TD PT24: 21.0000 mg | MEDICATED_PATCH | Freq: Every day | TRANSDERMAL | 0 refills | Status: AC

## 2024-11-07 MED ORDER — HYDROXYZINE HCL 25 MG PO TABS: 25.0000 mg | ORAL_TABLET | Freq: Four times a day (QID) | ORAL | 0 refills | Status: DC | PRN

## 2024-11-07 MED ORDER — NAPROXEN 375 MG PO TABS: 375.0000 mg | ORAL_TABLET | Freq: Two times a day (BID) | ORAL | 0 refills | Status: DC | PRN

## 2024-11-07 MED ORDER — AMLODIPINE BESYLATE 10 MG PO TABS
10.0000 mg | ORAL_TABLET | Freq: Every day | ORAL | Status: DC
  Administered 2024-11-07: 10 mg via ORAL
  Filled 2024-11-07: qty 14
  Filled 2024-11-07: qty 7
  Filled 2024-11-07: qty 1

## 2024-11-07 MED ORDER — VITAMIN D (ERGOCALCIFEROL) 1.25 MG (50000 UNIT) PO CAPS: 50000.0000 [IU] | ORAL_CAPSULE | ORAL | 0 refills | Status: AC

## 2024-11-07 MED ORDER — LEVOTHYROXINE SODIUM 50 MCG PO TABS: 50.0000 ug | ORAL_TABLET | Freq: Every day | ORAL | 0 refills | Status: DC

## 2024-11-07 MED ORDER — NYSTATIN-TRIAMCINOLONE 100000-0.1 UNIT/GM-% EX CREA: 1.0000 | TOPICAL_CREAM | Freq: Two times a day (BID) | CUTANEOUS | 0 refills | Status: DC

## 2024-11-07 MED ORDER — VITAMIN D (ERGOCALCIFEROL) 1.25 MG (50000 UNIT) PO CAPS
50000.0000 [IU] | ORAL_CAPSULE | ORAL | Status: DC
  Administered 2024-11-07: 50000 [IU] via ORAL
  Filled 2024-11-07: qty 1

## 2024-11-07 MED ORDER — MELATONIN 3 MG PO TABS: 3.0000 mg | ORAL_TABLET | Freq: Every evening | ORAL | 0 refills | Status: DC | PRN

## 2024-11-07 MED ORDER — GABAPENTIN 300 MG PO CAPS: 300.0000 mg | ORAL_CAPSULE | Freq: Three times a day (TID) | ORAL | 0 refills | Status: AC

## 2024-11-07 MED ORDER — MIRTAZAPINE 15 MG PO TABS: 15.0000 mg | ORAL_TABLET | Freq: Every day | ORAL | 0 refills | Status: DC

## 2024-11-07 MED ORDER — LOSARTAN POTASSIUM 100 MG PO TABS: 100.0000 mg | ORAL_TABLET | Freq: Every day | ORAL | 0 refills | Status: DC

## 2024-11-07 MED ORDER — AMLODIPINE BESYLATE 10 MG PO TABS: 10.0000 mg | ORAL_TABLET | Freq: Every evening | ORAL | 0 refills | Status: DC

## 2024-11-07 MED ORDER — ARIPIPRAZOLE 5 MG PO TABS: 5.0000 mg | ORAL_TABLET | Freq: Every day | ORAL | 0 refills | Status: DC

## 2024-11-07 NOTE — Group Note (Signed)
 Group Topic: Recovery Basics  Group Date: 11/07/2024 Start Time: 1000 End Time: 1030 Facilitators: Tekeya Geffert, Zane HERO, RN  Department: [facility]  Number of Participants: 1  Group Focus: check in and nursing group Treatment Modality:  Individual Therapy Interventions utilized were patient education and support Purpose: express feelings and increase insight  Name: Marie Atkins Date of Birth: Mar 30, 1971  MR: [id number]    Level of Participation: moderate Quality of Participation: attentive and cooperative Interactions with others: gave feedback Mood/Affect: appropriate Triggers (if applicable): None identified at this time Cognition: coherent/clear and logical Progress: Gaining insight Response: Patient voiced understanding of medications and treatment plan at this time. No concerns voiced. Patient aware of who to contact in case future questions arise. Plan: patient will be encouraged to continue to attend groups/programming on the unit  Patients Problems:  Patient Active Problem List   Diagnosis Date Noted   GAD (generalized anxiety disorder) 07/15/2024   Cocaine use 07/15/2024   Suicidal ideation 07/15/2024   Vitamin D  deficiency 12/15/2023   Severe recurrent major depressive disorder with psychotic features (HCC) 08/10/2023   Hypothyroidism 03/27/2023   Uncomplicated alcohol dependence (HCC) 03/03/2023   Cocaine use disorder (HCC) 03/03/2023   Tobacco use disorder 03/03/2023   MDD (major depressive disorder), recurrent, severe, with psychosis (HCC) 03/02/2023   Severe recurrent major depression without psychotic features (HCC) 06/24/2020   Menorrhagia 06/16/2020   Iron deficiency anemia due to chronic blood loss 06/16/2020   Proteinuria 06/16/2020   Depression 06/16/2020   Tobacco dependence due to cigarettes 06/16/2020   ASCUS with positive high risk HPV cervical 06/16/2020   Amphetamine and psychostimulant-induced psychosis with delusions  (HCC)    Benign essential HTN 02/16/2016   History of bilateral tubal ligation 08/27/2014

## 2024-11-07 NOTE — ED Notes (Signed)
 Patient alert & oriented x4. Denies intent to harm self or others when asked. Denies A/VH. Patient denies any physical complaints when asked. Patient's BP elevated at 142/86, provider Donia Snell, NP made aware. Patient also reporting flashing lights. Medications adjusted and given early to manage BP. Medications administered with no complications. No acute distress noted. Support and encouragement provided. Patient observed in milieu. No inappropriate behaviors observed or reported. Routine safety checks conducted per facility protocol. Encouraged patient to notify staff if any thoughts of harm towards self or others arise. Patient verbalizes understanding and agreement.

## 2024-11-07 NOTE — Group Note (Signed)
 Group Topic: Wellness  Group Date: 11/07/2024 Start Time: 2030 End Time: 2100 Facilitators: Anice Benton LABOR, NT  Department: [facility]  Number of Participants: 4  Group Focus: check in Treatment Modality:  Individual Therapy Interventions utilized were support Purpose: express feelings  Name: Marie Atkins Date of Birth: Feb 06, 1971  MR: [id number]    Level of Participation: active Quality of Participation: attentive Interactions with others: gave feedback Mood/Affect: appropriate Triggers (if applicable): N/A Cognition: coherent/clear Progress: Moderate Response: good Plan: follow-up needed  Patients Problems:  Patient Active Problem List   Diagnosis Date Noted   GAD (generalized anxiety disorder) 07/15/2024   Cocaine use 07/15/2024   Suicidal ideation 07/15/2024   Vitamin D  deficiency 12/15/2023   Severe recurrent major depressive disorder with psychotic features (HCC) 08/10/2023   Hypothyroidism 03/27/2023   Uncomplicated alcohol dependence (HCC) 03/03/2023   Cocaine use disorder (HCC) 03/03/2023   Tobacco use disorder 03/03/2023   MDD (major depressive disorder), recurrent, severe, with psychosis (HCC) 03/02/2023   Severe recurrent major depression without psychotic features (HCC) 06/24/2020   Menorrhagia 06/16/2020   Iron deficiency anemia due to chronic blood loss 06/16/2020   Proteinuria 06/16/2020   Depression 06/16/2020   Tobacco dependence due to cigarettes 06/16/2020   ASCUS with positive high risk HPV cervical 06/16/2020   Amphetamine and psychostimulant-induced psychosis with delusions (HCC)    Benign essential HTN 02/16/2016   History of bilateral tubal ligation 08/27/2014

## 2024-11-07 NOTE — ED Notes (Signed)
 Patient resting with eyes closed in no apparent acute distress. Respirations even and unlabored. Environment secured. Safety checks in place according to facility policy.

## 2024-11-07 NOTE — ED Notes (Signed)
 Blood pressure rechecked manually post am medications. BP now in normal range. Provider Donia Snell, NP made aware. Environment secured, safety checks in place per facility policy.

## 2024-11-07 NOTE — ED Notes (Signed)
 Pt sleeping in bed, no acute distress noted. Respirations even and unlabored. Continue to monitor for safety.

## 2024-11-07 NOTE — ED Notes (Signed)
 Patient sitting bedroom calm and composed. No acute distress noted. No concerns voiced. No inappropriate behaviors observed or reported at this time. Informed patient to notify staff with any needs or assistance. Patient verbalized understanding or agreement. Safety checks in place per facility policy.
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
## 2024-11-07 NOTE — ED Provider Notes (Signed)
 Behavioral Health Progress Note  Date and Time: 11/07/2024 11:38 AM Name: Marie Atkins MRN:  [id number]  HPI: Patient is a 53yo F with psych history of MDD, GAD, cocaine use disorder & alcohol use disorder, who was admitted to [facility] on 11/03/24 for treatment of addiction and mood stabilization.   24 hr chart review: Sleep Hours last night: Sleep was poor per pt, but she states that it was related to staff completing their 15 minute checks, and opening the door frequently, rendering her unable to sleep. Nursing Concerns:  Behavioral episodes in the past 24 hrs: none noted in charts/reported by staff. Medication Compliance: Comoplkiant Vital Signs in the past 24 hrs: WNL PRN Medications in the past 24 hrs: N/A  Patient assessment:   On assessment today, the pt reports that their mood is euthymic, improved since admission, and stable. Denies feeling down, depressed, or sad.  Reports that anxiety symptoms are at manageable level.  Sleep is stable. Appetite is stable.  Concentration is without complaint.  Energy level is adequate. Denies having any suicidal thoughts. Denies having any suicidal intent and plan.  Denies having any HI.  Denies having psychotic symptoms. Denies AVH, denies paranoia, denies first rank symptoms and there are no overt signs of psychosis. Denies having side effects to current psychiatric medications.  DC Planning: Patient is working to CSW to secure a discharge plan which includes going to substance abuse treatment for at least 30 days. Pt shares that  she has been accepted at the [facility] residential treatment facility. Tentative discharge date is 12/29 so as to foster a door to door transfer to [facility] Rehabilitative services. Acceptance information for [facility] as documented in CSW's notes is as follows:  Clinical Research Associate coordinated and intake interview for Monday 11/08/24 at 9:00 am.    [facility] - [facility] [address] [place], [place] [address] Phone: [phone]   RN will arrange transportation   7 day supple and scripts Manual, [place], [place] of Service: 11/05/2024 10:46 AM).   We will therefore plan to discharge patient tomorrow, 12/29. Pt denies medication related side effects, and would like for her Amlodipine  to be switched to nighttime. The Losartan  stays in the mornings. Keeping all other meds same.  Labs Reviewed: Vitamin D  low at 15.7, will supplement with Vit D 50,000 units.   Diagnosis:  Final diagnoses:  Cocaine use disorder (HCC) [F14.10]   Total Time spent with patient: 30 minutes  Past psych history, social history, family history - see H&P.   Sleep: Fair  Appetite:  Fair  Current Medications:  Current Facility-Administered Medications  Medication Dose Route Frequency Provider Last Rate Last Admin   acetaminophen  (TYLENOL ) tablet 650 mg  650 mg Oral Q6H PRN Kapoor, Sahil, MD       alum & mag hydroxide-simeth (MAALOX/MYLANTA) 200-200-20 MG/5ML suspension 30 mL  30 mL Oral Q4H PRN Kapoor, Sahil, MD       amLODipine  (NORVASC ) tablet 10 mg  10 mg Oral Daily Paliy, Alisa, MD   10 mg at 11/06/24 0855   ARIPiprazole  (ABILIFY ) tablet 5 mg  5 mg Oral Daily Paliy, Alisa, MD   5 mg at 11/07/24 9167   cloNIDine  (CATAPRES ) tablet 0.1 mg  0.1 mg Oral Q12H PRN Paliy, Alisa, MD   0.1 mg at 11/06/24 1225   haloperidol  (HALDOL ) tablet 5 mg  5 mg Oral TID PRN Kapoor, Sahil, MD       And   diphenhydrAMINE  (BENADRYL )  capsule 50 mg  50 mg Oral TID PRN Kapoor, Sahil, MD       haloperidol  lactate (HALDOL ) injection 5 mg  5 mg Intramuscular TID PRN Kapoor, Sahil, MD       And   diphenhydrAMINE  (BENADRYL ) injection 50 mg  50 mg Intramuscular TID PRN Kapoor, Sahil, MD       And   LORazepam  (ATIVAN ) injection 2 mg  2 mg Intramuscular TID PRN Kapoor, Sahil, MD       haloperidol  lactate (HALDOL ) injection 10 mg  10 mg Intramuscular TID PRN Kapoor, Sahil, MD       And   diphenhydrAMINE  (BENADRYL )  injection 50 mg  50 mg Intramuscular TID PRN Kapoor, Sahil, MD       And   LORazepam  (ATIVAN ) injection 2 mg  2 mg Intramuscular TID PRN Kapoor, Sahil, MD       gabapentin  (NEURONTIN ) capsule 300 mg  300 mg Oral TID Paliy, Alisa, MD   300 mg at 11/07/24 9167   hydrOXYzine  (ATARAX ) tablet 25 mg  25 mg Oral Q6H PRN Paliy, Alisa, MD   25 mg at 11/06/24 2109   levothyroxine  (SYNTHROID ) tablet 50 mcg  50 mcg Oral Daily Paliy, Alisa, MD   50 mcg at 11/07/24 9167   losartan  (COZAAR ) tablet 100 mg  100 mg Oral Daily Paliy, Alisa, MD   100 mg at 11/07/24 0831   magnesium  hydroxide (MILK OF MAGNESIA) suspension 30 mL  30 mL Oral Daily PRN Kapoor, Sahil, MD   30 mL at 11/05/24 2110   melatonin tablet 5 mg  5 mg Oral QHS Rigby Leonhardt, NP   5 mg at 11/06/24 2109   mirtazapine  (REMERON ) tablet 15 mg  15 mg Oral QHS Kaziah Krizek, Donia, NP   15 mg at 11/06/24 2109   naproxen  (NAPROSYN ) tablet 375 mg  375 mg Oral TID WC Paliy, Alisa, MD   375 mg at 11/07/24 9167   nicotine  (NICODERM CQ  - dosed in mg/24 hours) patch 21 mg  21 mg Transdermal Q0600 Kapoor, Sahil, MD       nystatin -triamcinolone  (MYCOLOG II) cream 1 Application  1 Application Topical BID Paliy, Alisa, MD   1 Application at 11/05/24 1638   white petrolatum  (VASELINE) gel   Topical PRN Gottfried, Rhoda J, MD   1 Application at 11/06/24 1633   Current Outpatient Medications  Medication Sig Dispense Refill   amLODipine  (NORVASC ) 10 MG tablet Take 10 mg by mouth.     busPIRone  (BUSPAR ) 5 MG tablet Take 5 mg by mouth 3 (three) times daily.     cetirizine  (ZYRTEC  ALLERGY) 10 MG tablet Take 1 tablet (10 mg total) by mouth daily. (Patient taking differently: Take 10 mg by mouth daily as needed for allergies.) 30 tablet 11   ferrous sulfate  325 (65 FE) MG tablet Take 1 tablet (325 mg total) by mouth daily. 30 tablet 1   fluticasone  (FLONASE ) 50 MCG/ACT nasal spray Place 2 sprays into both nostrils daily. (Patient taking differently: Place 2 sprays into both  nostrils daily as needed for allergies.) 16 g 6   gabapentin  (NEURONTIN ) 300 MG capsule Take 300 mg by mouth 3 (three) times daily.     hydrOXYzine  (ATARAX ) 25 MG tablet Take 1 tablet (25 mg total) by mouth every 6 (six) hours as needed for anxiety. 30 tablet 0   levothyroxine  (SYNTHROID ) 50 MCG tablet Take 1 tablet (50 mcg total) by mouth daily at 6 (six) AM. 30 tablet 0   losartan  (  COZAAR ) 100 MG tablet Take 1 tablet (100 mg total) by mouth daily. 30 tablet 0   melatonin 3 MG TABS tablet Take 3 mg by mouth at bedtime as needed (sleep).     meloxicam  (MOBIC ) 15 MG tablet Take 15 mg by mouth daily.     pantoprazole  (PROTONIX ) 40 MG tablet Take 1 tablet (40 mg total) by mouth daily. 30 tablet 1   sertraline  (ZOLOFT ) 50 MG tablet Take 1 tablet (50 mg total) by mouth daily. 30 tablet 0   traZODone  (DESYREL ) 50 MG tablet Take 100 mg by mouth at bedtime as needed for sleep.     Vitamin D , Ergocalciferol , (DRISDOL ) 1.25 MG (50000 UNIT) CAPS capsule Take 1 capsule (50,000 Units total) by mouth every 7 (seven) days. 4 capsule 2    Labs  Lab Results:  Admission on 11/03/2024  Component Date Value Ref Range Status   WBC 11/03/2024 10.0  4.0 - 10.5 K/uL Final   RBC 11/03/2024 4.51  3.87 - 5.11 MIL/uL Final   Hemoglobin 11/03/2024 11.2 (L)  12.0 - 15.0 g/dL Final   HCT 87/75/7974 36.2  36.0 - 46.0 % Final   MCV 11/03/2024 80.3  80.0 - 100.0 fL Final   MCH 11/03/2024 24.8 (L)  26.0 - 34.0 pg Final   MCHC 11/03/2024 30.9  30.0 - 36.0 g/dL Final   RDW 87/75/7974 20.4 (H)  11.5 - 15.5 % Final   Platelets 11/03/2024 311  150 - 400 K/uL Final   nRBC 11/03/2024 0.0  0.0 - 0.2 % Final   Neutrophils Relative % 11/03/2024 58  % Final   Neutro Abs 11/03/2024 5.8  1.7 - 7.7 K/uL Final   Lymphocytes Relative 11/03/2024 32  % Final   Lymphs Abs 11/03/2024 3.2  0.7 - 4.0 K/uL Final   Monocytes Relative 11/03/2024 8  % Final   Monocytes Absolute 11/03/2024 0.8  0.1 - 1.0 K/uL Final   Eosinophils Relative  11/03/2024 1  % Final   Eosinophils Absolute 11/03/2024 0.1  0.0 - 0.5 K/uL Final   Basophils Relative 11/03/2024 1  % Final   Basophils Absolute 11/03/2024 0.1  0.0 - 0.1 K/uL Final   Immature Granulocytes 11/03/2024 0  % Final   Abs Immature Granulocytes 11/03/2024 0.03  0.00 - 0.07 K/uL Final   Performed at [facility], [address], [place], [place] [address]   Sodium 11/03/2024 138  135 - 145 mmol/L Final   Potassium 11/03/2024 4.2  3.5 - 5.1 mmol/L Final   Chloride 11/03/2024 103  98 - 111 mmol/L Final   CO2 11/03/2024 24  22 - 32 mmol/L Final   Glucose, Bld 11/03/2024 106 (H)  70 - 99 mg/dL Final   Glucose reference range applies only to samples taken after fasting for at least 8 hours.   BUN 11/03/2024 15  6 - 20 mg/dL Final   Creatinine, Ser 11/03/2024 0.94  0.44 - 1.00 mg/dL Final   Calcium 87/75/7974 9.3  8.9 - 10.3 mg/dL Final   Total Protein 87/75/7974 7.4  6.5 - 8.1 g/dL Final   Albumin 87/75/7974 4.0  3.5 - 5.0 g/dL Final   AST 87/75/7974 22  15 - 41 U/L Final   ALT 11/03/2024 20  0 - 44 U/L Final   Alkaline Phosphatase 11/03/2024 112  38 - 126 U/L Final   Total Bilirubin 11/03/2024 0.3  0.0 - 1.2 mg/dL Final   GFR, Estimated 11/03/2024 >60  >60 mL/min Final   Comment: (  NOTE) Calculated using the CKD-EPI Creatinine Equation (2021)    Anion gap 11/03/2024 11  5 - 15 Final   Performed at [facility], [address], [place], [place] [address]   Alcohol, Ethyl (B) 11/03/2024 <15  <15 mg/dL Final   Comment: (NOTE) For medical purposes only. Performed at [facility], [address] [place], [place] [address]    Hgb A1c MFr Bld 11/03/2024 5.2  4.8 - 5.6 % Final   Comment: (NOTE) Diagnosis of Diabetes The following HbA1c ranges recommended by the American Diabetes Association (ADA) may be used as an aid in the diagnosis of diabetes mellitus.  Hemoglobin             Suggested A1C NGSP%              Diagnosis  <5.7                   Non  Diabetic  5.7-6.4                Pre-Diabetic  >6.4                   Diabetic  <7.0                   Glycemic control for                       adults with diabetes.     Mean Plasma Glucose 11/03/2024 102.54  mg/dL Final   Performed at [facility], [address], [place], [place] [address]   Cholesterol 11/03/2024 187  0 - 200 mg/dL Final   Comment:        ATP III CLASSIFICATION:  <200     mg/dL   Desirable  799-760  mg/dL   Borderline High  >=759    mg/dL   High           Triglycerides 11/03/2024 68  <150 mg/dL Final   HDL 87/75/7974 84  >40 mg/dL Final   Total CHOL/HDL Ratio 11/03/2024 2.2  RATIO Final   VLDL 11/03/2024 14  0 - 40 mg/dL Final   LDL Cholesterol 11/03/2024 89  0 - 99 mg/dL Final   Comment:        Total Cholesterol/HDL:CHD Risk Coronary Heart Disease Risk Table                     Men   Women  1/2 Average Risk   3.4   3.3  Average Risk       5.0   4.4  2 X Average Risk   9.6   7.1  3 X Average Risk  23.4   11.0        Use the calculated Patient Ratio above and the CHD Risk Table to determine the patient's CHD Risk.        ATP III CLASSIFICATION (LDL):  <100     mg/dL   Optimal  899-870  mg/dL   Near or Above                    Optimal  130-159  mg/dL   Borderline  839-810  mg/dL   High  >809     mg/dL   Very High Performed at [facility], [address], [place], [place] [address]    TSH 11/03/2024 1.060  0.350 - 4.500 uIU/mL Final  Performed at [facility], [address] [place], [place] [address]   Vit D, 25-Hydroxy 11/07/2024 15.7 (L)  30 - 100 ng/mL Final   Comment: (NOTE) Vitamin D  deficiency has been defined by the Institute of Medicine  and an Endocrine Society practice guideline as a level of serum 25-OH  vitamin D  less than 20 ng/mL (1,2). The Endocrine Society went on to  further define vitamin D  insufficiency as a level between 21 and 29  ng/mL (2).  1. IOM (Institute of Medicine). 2010. Dietary reference  intakes for  calcium and D. [place]: [employer]. 2. Holick MF, [place], Bischoff-Ferrari HA, et al. Evaluation,  treatment, and prevention of vitamin D  deficiency: an Endocrine  Society clinical practice guideline, JCEM. 2011 Jul; 96(7): 1911-30.  Performed at [facility], [address], [place], [place] [address]    Vitamin B-12 11/07/2024 890  180 - 914 pg/mL Final   Performed at [facility], [address] [place], [place] [address]  Admission on 11/03/2024, Discharged on 11/03/2024  Component Date Value Ref Range Status   SARS Coronavirus 2 by RT PCR 11/03/2024 NEGATIVE  NEGATIVE Final   Performed at [facility], [address] [place], [place] [address]   WBC 11/03/2024 10.7 (H)  4.0 - 10.5 K/uL Final   RBC 11/03/2024 4.80  3.87 - 5.11 MIL/uL Final   Hemoglobin 11/03/2024 12.0  12.0 - 15.0 g/dL Final   HCT 87/75/7974 38.4  36.0 - 46.0 % Final   MCV 11/03/2024 80.0  80.0 - 100.0 fL Final   MCH 11/03/2024 25.0 (L)  26.0 - 34.0 pg Final   MCHC 11/03/2024 31.3  30.0 - 36.0 g/dL Final   RDW 87/75/7974 20.6 (H)  11.5 - 15.5 % Final   Platelets 11/03/2024 323  150 - 400 K/uL Final   nRBC 11/03/2024 0.0  0.0 - 0.2 % Final   Neutrophils Relative % 11/03/2024 66  % Final   Neutro Abs 11/03/2024 7.2  1.7 - 7.7 K/uL Final   Lymphocytes Relative 11/03/2024 22  % Final   Lymphs Abs 11/03/2024 2.3  0.7 - 4.0 K/uL Final   Monocytes Relative 11/03/2024 10  % Final   Monocytes Absolute 11/03/2024 1.0  0.1 - 1.0 K/uL Final   Eosinophils Relative 11/03/2024 1  % Final   Eosinophils Absolute 11/03/2024 0.1  0.0 - 0.5 K/uL Final   Basophils Relative 11/03/2024 1  % Final   Basophils Absolute 11/03/2024 0.1  0.0 - 0.1 K/uL Final   Immature Granulocytes 11/03/2024 0  % Final   Abs Immature Granulocytes 11/03/2024 0.03  0.00 - 0.07 K/uL Final   Performed at [facility], [address] [place], [place] [address]   Sodium 11/03/2024 137  135  - 145 mmol/L Final   Potassium 11/03/2024 4.6  3.5 - 5.1 mmol/L Final   Chloride 11/03/2024 104  98 - 111 mmol/L Final   CO2 11/03/2024 24  22 - 32 mmol/L Final   Glucose, Bld 11/03/2024 89  70 - 99 mg/dL Final   Glucose reference range applies only to samples taken after fasting for at least 8 hours.   BUN 11/03/2024 11  6 - 20 mg/dL Final   Creatinine, Ser 11/03/2024 0.81  0.44 - 1.00 mg/dL Final   Calcium 87/75/7974 9.3  8.9 - 10.3 mg/dL Final   Total Protein 87/75/7974 7.6  6.5 - 8.1 g/dL Final   Albumin  11/03/2024 4.3  3.5 - 5.0 g/dL Final   AST 87/75/7974 18  15 - 41 U/L Final   ALT 11/03/2024 21  0 - 44 U/L Final   Alkaline Phosphatase 11/03/2024 126  38 - 126 U/L Final   Total Bilirubin 11/03/2024 0.3  0.0 - 1.2 mg/dL Final   GFR, Estimated 11/03/2024 >60  >60 mL/min Final   Comment: (NOTE) Calculated using the CKD-EPI Creatinine Equation (2021)    Anion gap 11/03/2024 9  5 - 15 Final   Performed at [facility], [address], [place], [place] [address]   Hgb A1c MFr Bld 11/03/2024 5.3  4.8 - 5.6 % Final   Comment: (NOTE) Diagnosis of Diabetes The following HbA1c ranges recommended by the American Diabetes Association (ADA) may be used as an aid in the diagnosis of diabetes mellitus.  Hemoglobin             Suggested A1C NGSP%              Diagnosis  <5.7                   Non Diabetic  5.7-6.4                Pre-Diabetic  >6.4                   Diabetic  <7.0                   Glycemic control for                       adults with diabetes.     Mean Plasma Glucose 11/03/2024 105.41  mg/dL Final   Performed at [facility], [address] [place], [place] [address]   Alcohol, Ethyl (B) 11/03/2024 <15  <15 mg/dL Final   Comment: (NOTE) For medical purposes only. Performed at [facility], [address] [place], [place] [address]    Cholesterol 11/03/2024 204 (H)  0 - 200 mg/dL Final   Comment:        ATP III CLASSIFICATION:  <200      mg/dL   Desirable  799-760  mg/dL   Borderline High  >=759    mg/dL   High           Triglycerides 11/03/2024 81  <150 mg/dL Final   HDL 87/75/7974 89  >40 mg/dL Final   Total CHOL/HDL Ratio 11/03/2024 2.3  RATIO Final   VLDL 11/03/2024 16  0 - 40 mg/dL Final   LDL Cholesterol 11/03/2024 99  0 - 99 mg/dL Final   Comment:        Total Cholesterol/HDL:CHD Risk Coronary Heart Disease Risk Table                     Men   Women  1/2 Average Risk   3.4   3.3  Average Risk       5.0   4.4  2 X Average Risk   9.6   7.1  3 X Average Risk  23.4   11.0        Use the calculated Patient Ratio above and the CHD Risk Table to determine the patient's CHD Risk.        ATP III CLASSIFICATION (LDL):  <100     mg/dL   Optimal  899-870  mg/dL   Near or Above  Optimal  130-159  mg/dL   Borderline  839-810  mg/dL   High  >809     mg/dL   Very High Performed at [facility], [address] [place], [place] [address]    TSH 11/03/2024 1.080  0.350 - 4.500 uIU/mL Final   Performed at [facility], [address] [place], [place] [address]   Color, Urine 11/03/2024 YELLOW  YELLOW Final   APPearance 11/03/2024 CLOUDY (A)  CLEAR Final   Specific Gravity, Urine 11/03/2024 1.026  1.005 - 1.030 Final   pH 11/03/2024 5.0  5.0 - 8.0 Final   Glucose, UA 11/03/2024 NEGATIVE  NEGATIVE mg/dL Final   Hgb urine dipstick 11/03/2024 NEGATIVE  NEGATIVE Final   Bilirubin Urine 11/03/2024 NEGATIVE  NEGATIVE Final   Ketones, ur 11/03/2024 NEGATIVE  NEGATIVE mg/dL Final   Protein, ur 87/75/7974 30 (A)  NEGATIVE mg/dL Final   Nitrite 87/75/7974 NEGATIVE  NEGATIVE Final   Leukocytes,Ua 11/03/2024 TRACE (A)  NEGATIVE Final   RBC / HPF 11/03/2024 0-5  0 - 5 RBC/hpf Final   WBC, UA 11/03/2024 0-5  0 - 5 WBC/hpf Final   Bacteria, UA 11/03/2024 RARE (A)  NONE SEEN Final   Squamous Epithelial / HPF 11/03/2024 11-20  0 - 5 /HPF Final   Mucus 11/03/2024 PRESENT   Final   Performed at [facility], [address], [place], [place] [address]   POC Amphetamine UR 11/03/2024 None Detected  NONE DETECTED (Cut Off Level 1000 ng/mL) Final   POC Secobarbital (BAR) 11/03/2024 None Detected  NONE DETECTED (Cut Off Level 300 ng/mL) Final   POC Buprenorphine (BUP) 11/03/2024 None Detected  NONE DETECTED (Cut Off Level 10 ng/mL) Final   POC Oxazepam (BZO) 11/03/2024 None Detected  NONE DETECTED (Cut Off Level 300 ng/mL) Final   POC Cocaine UR 11/03/2024 Positive (A)  NONE DETECTED (Cut Off Level 300 ng/mL) Final   POC Methamphetamine UR 11/03/2024 None Detected  NONE DETECTED (Cut Off Level 1000 ng/mL) Final   POC Morphine  11/03/2024 None Detected  NONE DETECTED (Cut Off Level 300 ng/mL) Final   POC Methadone UR 11/03/2024 None Detected  NONE DETECTED (Cut Off Level 300 ng/mL) Final   POC Oxycodone UR 11/03/2024 None Detected  NONE DETECTED (Cut Off Level 100 ng/mL) Final   POC Marijuana UR 11/03/2024 None Detected  NONE DETECTED (Cut Off Level 50 ng/mL) Final  Admission on 07/15/2024, Discharged on 07/17/2024  Component Date Value Ref Range Status   Preg Test, Ur 07/15/2024 Negative  Negative Final   TSH 07/15/2024 0.727  0.350 - 4.500 uIU/mL Final   Comment: Performed by a 3rd Generation assay with a functional sensitivity of <=0.01 uIU/mL. Performed at [facility], [address] [place], [place] [address]   Admission on 07/14/2024, Discharged on 07/15/2024  Component Date Value Ref Range Status   Sodium 07/14/2024 139  135 - 145 mmol/L Final   Potassium 07/14/2024 4.3  3.5 - 5.1 mmol/L Final   Chloride 07/14/2024 105  98 - 111 mmol/L Final   CO2 07/14/2024 22  22 - 32 mmol/L Final   Glucose, Bld 07/14/2024 72  70 - 99 mg/dL Final   Glucose reference range applies only to samples taken after fasting for at least 8 hours.   BUN 07/14/2024 10  6 - 20 mg/dL Final   Creatinine, Ser 07/14/2024 0.64  0.44 - 1.00 mg/dL Final   Calcium 90/96/7974 9.6  8.9 - 10.3  mg/dL Final    Total Protein 07/14/2024 7.6  6.5 - 8.1 g/dL Final   Albumin 90/96/7974 4.1  3.5 - 5.0 g/dL Final   AST 90/96/7974 29  15 - 41 U/L Final   ALT 07/14/2024 25  0 - 44 U/L Final   Alkaline Phosphatase 07/14/2024 119  38 - 126 U/L Final   Total Bilirubin 07/14/2024 0.4  0.0 - 1.2 mg/dL Final   GFR, Estimated 07/14/2024 >60  >60 mL/min Final   Comment: (NOTE) Calculated using the CKD-EPI Creatinine Equation (2021)    Anion gap 07/14/2024 13  5 - 15 Final   Performed at [facility], [address] [place], [place] [address]   Alcohol, Ethyl (B) 07/14/2024 <15  <15 mg/dL Final   Comment: (NOTE) For medical purposes only. Performed at [facility], [address] [place], [place] [address]    WBC 07/14/2024 13.7 (H)  4.0 - 10.5 K/uL Final   RBC 07/14/2024 4.32  3.87 - 5.11 MIL/uL Final   Hemoglobin 07/14/2024 10.3 (L)  12.0 - 15.0 g/dL Final   HCT 90/96/7974 33.7 (L)  36.0 - 46.0 % Final   MCV 07/14/2024 78.0 (L)  80.0 - 100.0 fL Final   MCH 07/14/2024 23.8 (L)  26.0 - 34.0 pg Final   MCHC 07/14/2024 30.6  30.0 - 36.0 g/dL Final   RDW 90/96/7974 19.3 (H)  11.5 - 15.5 % Final   Platelets 07/14/2024 380  150 - 400 K/uL Final   nRBC 07/14/2024 0.0  0.0 - 0.2 % Final   Performed at [facility], [address] [place], [place] [address]   Opiates 07/14/2024 NEGATIVE  NEGATIVE Final   Cocaine 07/14/2024 POSITIVE (A)  NEGATIVE Final   Benzodiazepines 07/14/2024 NEGATIVE  NEGATIVE Final   Amphetamines 07/14/2024 NEGATIVE  NEGATIVE Final   Tetrahydrocannabinol 07/14/2024 NEGATIVE  NEGATIVE Final   Barbiturates 07/14/2024 NEGATIVE  NEGATIVE Final   Methadone Scn, Ur 07/14/2024 NEGATIVE  NEGATIVE Final   Fentanyl  07/14/2024 NEGATIVE  NEGATIVE Final   Comment: (NOTE) Drug screen is for Medical Purposes only. Positive results are preliminary only. If confirmation is needed, notify lab within 5 days.  Drug Class                 Cutoff  (ng/mL) Amphetamine and metabolites 1000 Barbiturate and metabolites 200 Benzodiazepine              200 Opiates and metabolites     300 Cocaine and metabolites     300 THC                         50 Fentanyl                     5 Methadone                   300  Trazodone  is metabolized in vivo to several metabolites,  including pharmacologically active m-CPP, which is excreted in the  urine.  Immunoassay screens for amphetamines and MDMA have potential  cross-reactivity with these compounds and may provide false positive  result.  Performed at [facility], [address], [place], [place] [address]    Acetaminophen  (Tylenol ), Serum 07/14/2024 <10 (L)  10 - 30 ug/mL Final   Comment: (NOTE) Toxic concentrations can be more effectively related to post dose interval; > 200, > 100, and > 50 ug/mL serum concentrations  correspond to toxic concentrations at 4, 8, and 12 hours post dose, respectively.  Performed at [facility], [address], [place], [place] [address]     Blood Alcohol level:  Lab Results  Component Value Date   [facility] <15 11/03/2024   ETH <15 11/03/2024    Metabolic Disorder Labs: Lab Results  Component Value Date   HGBA1C 5.2 11/03/2024   MPG 102.54 11/03/2024   MPG 105.41 11/03/2024   No results found for: PROLACTIN Lab Results  Component Value Date   CHOL 187 11/03/2024   TRIG 68 11/03/2024   HDL 84 11/03/2024   CHOLHDL 2.2 11/03/2024   VLDL 14 11/03/2024   LDLCALC 89 11/03/2024   LDLCALC 99 11/03/2024    Therapeutic Lab Levels: No results found for: LITHIUM No results found for: VALPROATE No results found for: CBMZ  Physical Findings   AIMS    Flowsheet Row [facility] Ad[facility]) [facility]  AIMS Total Score 0 0   AUDIT    Flowsheet Row Ad[facility]  [facility]) [facility] Ad[facility] (Discharged) [facility] Admission (Discharged) [facility]  Alcohol Use Disorder Identification Test Final Score (AUDIT) 25 34 5   PHQ2-9    Flowsheet Row ED from 11/03/2024 in [facility] Most recent reading at 11/06/2024 10:58 AM ED from 11/03/2024 in [facility] Most recent reading at 11/03/2024  1:54 PM ED from 07/15/2024 in [facility] Most recent reading at 07/15/2024  5:25 AM Office Visit from 06/16/2020 in [facility] [place] Most recent reading at 06/16/2020  4:08 PM ED from 06/15/2020 in [facility] Emergency Department at [facility] Most recent reading at 06/15/2020  9:56 AM  PHQ-2 Total Score 0 6 6 4 2   PHQ-9 Total Score 0 23 27 17 9    Flowsheet Row ED from 11/03/2024 in [facility] Most recent reading at 11/03/2024 11:55 PM ED from 11/03/2024 in [facility] Most recent reading at 11/03/2024 12:43 PM ED from 07/15/2024 in [facility] Most recent reading at 07/15/2024  6:40 AM  C-SSRS RISK CATEGORY Moderate Risk No Risk High Risk     Musculoskeletal  Strength & Muscle Tone: within normal limits Gait & Station: normal Patient leans: N/A  Psychiatric Specialty Exam  Appearance:  AAF, appearing stated age,  wearing appropriate to the situation hospital clothes, with fair grooming and hygiene. Normal level of alertness and appropriate facial expression.   Attitude/Behavior: calm, cooperative, engaging with appropriate eye contact.   Motor: WNL; dyskinesias not evident. Gait appears in full range.   Speech: spontaneous, clear, coherent, normal comprehension.   Mood:  okay.   Affect: appropriately-reactive, full range.   Thought process: patient appears coherent, organized, logical, goal-directed.   Thought content: patient denies suicidal thoughts, denies homicidal thoughts; did not express any delusions.   Thought perception: patient denies auditory and visual hallucinations. Did not appear internally stimulated.   Cognition: patient is alert and oriented in self, place, date; with intact attention and concentration.   Insight: fair.   Judgement: fair.    Assets  Assets: Social Support; Resilience  Sleep  Sleep: Sleep: Good  Estimated Sleeping Duration (Last 24 Hours): 14.25-17.00 hours  Nutritional Assessment (For OBS and [facility] admissions only) Has the patient had a weight loss or gain of 10 pounds or more in the last 3 months?: No Has the patient had a decrease in food intake/or appetite?: No Does the patient have dental problems?: No Does the patient have eating habits or behaviors that may be indicators of an eating disorder including binging or inducing vomiting?: No Has the patient recently lost weight without trying?: 0 Has the patient been eating poorly because of a decreased appetite?: 0 Malnutrition Screening Tool Score: 0     Physical Exam  Physical Exam Vitals and nursing note reviewed.  HENT:     Head: Normocephalic.  Eyes:     Pupils: Pupils are equal, round, and reactive to light.  Musculoskeletal:     Cervical back: Normal range of motion.  Neurological:     General: No focal deficit present.     Mental Status: She is oriented to person, place, and time.  Psychiatric:        Behavior: Behavior normal.        Thought Content: Thought content normal.    Review of Systems  Psychiatric/Behavioral:  Positive for depression and substance abuse. Negative for hallucinations, memory loss and suicidal ideas. The patient is nervous/anxious and has insomnia.   All other systems reviewed and are negative.  Blood  pressure 122/60, pulse 76, temperature 98.4 F (36.9 C), temperature source Oral, resp. rate 18, SpO2 99%. There is no height or weight on file to calculate BMI.  Treatment Plan Summary: Daily contact with patient to assess and evaluate symptoms and progress in treatment and Medication management  Impression: Cocaine use disorder Alcohol use disorder Substance-induced mood disorder.   Medications Plan: -continue admission to [facility]   -CIWA discontinued. No withdrawal symptoms with taper being completed.  -Start Vitamin D  50.000 units for low Vit D levels. -continue Abilify  5mg  po daily for mood stabilization -Continue Remeron  15 mg at bedtime for sleep, depression. -Previously discontinued Trazodone  100mg  PO due to lack of efficacy & c/o dizziness -Continue Melatonin 5 mg nightly for sleep -continue Hydroxyzine  25mg  PO TID PRN anxiety.  -cont. Amlodipine  10mg  po daily for HTN -cont Losartan  100mg  po daily for HTN -cont Gabapentin  300mg  PO TID for polyneuropathy -cont Levothyroxine  50mcg po daily for hypothyroidism -cont Nicotine  transdermal patch 21mg  daily for tobacco cravings -continue Marcobid (Nitrofurantoin ) 100mg  PO Q12h for 4 more days for UTI sx   Other PRNs: acetaminophen , alum & mag hydroxide-simeth, cloNIDine , haloperidol  **AND** diphenhydrAMINE , haloperidol  lactate **AND** diphenhydrAMINE  **AND** LORazepam , haloperidol  lactate **AND** diphenhydrAMINE  **AND** LORazepam , hydrOXYzine , magnesium  hydroxide, white petrolatum    Dispo: Per CSW's note: Writer coordinated and intake interview for Monday 11/08/24 at 9:00 am.    [facility] [address] [place], [place] [address] Phone: [phone]. Discharge date will therefore be 12/29 so as to enable a door to door transfer to mitigate the risk of relapsing.   Donia Snell, NP 11/07/2024 11:38 AM

## 2024-11-07 NOTE — Group Note (Signed)
 Group Topic: Relaxation  Group Date: 11/07/2024 Start Time: 1400 End Time: 1430 Facilitators: Judi Monico RAMAN, NT  Department: [facility]  Number of Participants: 2  Group Focus: check in and daily focus Treatment Modality:  Leisure Development Interventions utilized were leisure development and patient education Purpose: increase insight  Name: Marie Atkins Date of Birth: 1971-09-26  MR: [id number]    Level of Participation: Pt did not attend groups  Patients Problems:  Patient Active Problem List   Diagnosis Date Noted   GAD (generalized anxiety disorder) 07/15/2024   Cocaine use 07/15/2024   Suicidal ideation 07/15/2024   Vitamin D  deficiency 12/15/2023   Severe recurrent major depressive disorder with psychotic features (HCC) 08/10/2023   Hypothyroidism 03/27/2023   Uncomplicated alcohol dependence (HCC) 03/03/2023   Cocaine use disorder (HCC) 03/03/2023   Tobacco use disorder 03/03/2023   MDD (major depressive disorder), recurrent, severe, with psychosis (HCC) 03/02/2023   Severe recurrent major depression without psychotic features (HCC) 06/24/2020   Menorrhagia 06/16/2020   Iron deficiency anemia due to chronic blood loss 06/16/2020   Proteinuria 06/16/2020   Depression 06/16/2020   Tobacco dependence due to cigarettes 06/16/2020   ASCUS with positive high risk HPV cervical 06/16/2020   Amphetamine and psychostimulant-induced psychosis with delusions (HCC)    Benign essential HTN 02/16/2016   History of bilateral tubal ligation 08/27/2014

## 2024-11-08 ENCOUNTER — Other Ambulatory Visit (HOSPITAL_COMMUNITY)
Admission: EM | Admit: 2024-11-08 | Discharge: 2024-11-09 | Disposition: A | Payer: MEDICAID | Source: Home / Self Care | Attending: Psychiatry | Admitting: Psychiatry

## 2024-11-08 DIAGNOSIS — F17210 Nicotine dependence, cigarettes, uncomplicated: Secondary | ICD-10-CM | POA: Diagnosis not present

## 2024-11-08 DIAGNOSIS — F1410 Cocaine abuse, uncomplicated: Secondary | ICD-10-CM | POA: Diagnosis not present

## 2024-11-08 DIAGNOSIS — F411 Generalized anxiety disorder: Secondary | ICD-10-CM | POA: Diagnosis not present

## 2024-11-08 DIAGNOSIS — F1420 Cocaine dependence, uncomplicated: Secondary | ICD-10-CM | POA: Diagnosis present

## 2024-11-08 DIAGNOSIS — F1994 Other psychoactive substance use, unspecified with psychoactive substance-induced mood disorder: Secondary | ICD-10-CM

## 2024-11-08 DIAGNOSIS — F1094 Alcohol use, unspecified with alcohol-induced mood disorder: Secondary | ICD-10-CM | POA: Insufficient documentation

## 2024-11-08 DIAGNOSIS — F1090 Alcohol use, unspecified, uncomplicated: Secondary | ICD-10-CM | POA: Diagnosis not present

## 2024-11-08 DIAGNOSIS — F321 Major depressive disorder, single episode, moderate: Secondary | ICD-10-CM | POA: Diagnosis not present

## 2024-11-08 DIAGNOSIS — F39 Unspecified mood [affective] disorder: Secondary | ICD-10-CM

## 2024-11-08 MED ORDER — VITAMIN D 25 MCG (1000 UNIT) PO TABS
2000.0000 [IU] | ORAL_TABLET | Freq: Every day | ORAL | Status: DC
  Administered 2024-11-09: 2000 [IU] via ORAL
  Filled 2024-11-08: qty 2

## 2024-11-08 MED ORDER — HALOPERIDOL 5 MG PO TABS: 5.0000 mg | ORAL_TABLET | Freq: Three times a day (TID) | ORAL | Status: DC | PRN

## 2024-11-08 MED ORDER — AMLODIPINE BESYLATE 10 MG PO TABS
10.0000 mg | ORAL_TABLET | Freq: Every day | ORAL | Status: DC
  Administered 2024-11-08: 10 mg via ORAL
  Filled 2024-11-08: qty 1

## 2024-11-08 MED ORDER — ARIPIPRAZOLE 5 MG PO TABS
5.0000 mg | ORAL_TABLET | Freq: Every day | ORAL | Status: DC
  Administered 2024-11-08 – 2024-11-09 (×2): 5 mg via ORAL
  Filled 2024-11-08 (×2): qty 1

## 2024-11-08 MED ORDER — DIPHENHYDRAMINE HCL 50 MG PO CAPS: 50.0000 mg | ORAL_CAPSULE | Freq: Three times a day (TID) | ORAL | Status: DC | PRN

## 2024-11-08 MED ORDER — NAPROXEN 375 MG PO TABS
375.0000 mg | ORAL_TABLET | Freq: Two times a day (BID) | ORAL | Status: DC | PRN
  Filled 2024-11-08: qty 14

## 2024-11-08 MED ORDER — ALUM & MAG HYDROXIDE-SIMETH 200-200-20 MG/5ML PO SUSP: 30.0000 mL | ORAL | Status: DC | PRN

## 2024-11-08 MED ORDER — ACETAMINOPHEN 325 MG PO TABS: 650.0000 mg | ORAL_TABLET | Freq: Four times a day (QID) | ORAL | Status: DC | PRN

## 2024-11-08 MED ORDER — NICOTINE 21 MG/24HR TD PT24
21.0000 mg | MEDICATED_PATCH | Freq: Every day | TRANSDERMAL | Status: DC
  Filled 2024-11-08 (×2): qty 1

## 2024-11-08 MED ORDER — MAGNESIUM HYDROXIDE 400 MG/5ML PO SUSP: 30.0000 mL | Freq: Every day | ORAL | Status: DC | PRN

## 2024-11-08 MED ORDER — LOSARTAN POTASSIUM 50 MG PO TABS
100.0000 mg | ORAL_TABLET | Freq: Every morning | ORAL | Status: DC
  Administered 2024-11-08 – 2024-11-09 (×2): 100 mg via ORAL
  Filled 2024-11-08 (×2): qty 2

## 2024-11-08 MED ORDER — TRAZODONE HCL 50 MG PO TABS: 50.0000 mg | ORAL_TABLET | Freq: Every evening | ORAL | Status: DC | PRN

## 2024-11-08 MED ORDER — HYDROXYZINE HCL 25 MG PO TABS: 50.0000 mg | ORAL_TABLET | Freq: Three times a day (TID) | ORAL | Status: DC | PRN

## 2024-11-08 MED ORDER — GABAPENTIN 300 MG PO CAPS
300.0000 mg | ORAL_CAPSULE | Freq: Three times a day (TID) | ORAL | Status: DC
  Administered 2024-11-08 – 2024-11-09 (×3): 300 mg via ORAL
  Filled 2024-11-08 (×4): qty 1

## 2024-11-08 MED ORDER — MELATONIN 5 MG PO TABS
5.0000 mg | ORAL_TABLET | Freq: Every day | ORAL | Status: DC
  Administered 2024-11-08: 5 mg via ORAL
  Filled 2024-11-08: qty 1

## 2024-11-08 MED ORDER — MIRTAZAPINE 15 MG PO TABS
15.0000 mg | ORAL_TABLET | Freq: Every day | ORAL | Status: DC
  Administered 2024-11-08: 15 mg via ORAL
  Filled 2024-11-08: qty 1

## 2024-11-08 NOTE — Care Management (Signed)
 [facility] Care Management...  Per Rosaline at [facility],  the client will need to be stable for a minimum of 3-4 days on her blood pressure medication before she will be consider again for residential treatment.    Rosaline is requesting an updated progress note be faxed to her after 3-4 days of continuance use of her blood pressure medication.    [facility] Fax number: [phone]

## 2024-11-08 NOTE — ED Provider Notes (Cosign Needed Addendum)
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 MAGNESIA) suspension 30 mL   haloperidol  (HALDOL ) tablet 5 mg   And   diphenhydrAMINE  (BENADRYL ) capsule 50 mg   hydrOXYzine  (ATARAX ) tablet 50 mg   traZODone  (DESYREL ) tablet 50 mg   losartan   (COZAAR ) tablet 100 mg   [START ON 11/09/2024] nicotine  (NICODERM CQ  - dosed in mg/24 hours) patch 21 mg   amLODipine  (NORVASC ) tablet 10 mg   mirtazapine  (REMERON ) tablet 15 mg   ARIPiprazole  (ABILIFY ) tablet 5 mg   gabapentin  (NEURONTIN ) capsule 300 mg   melatonin tablet 5 mg   [START ON 11/09/2024] cholecalciferol  (VITAMIN D3) 25 MCG (1000 UNIT) tablet 2,000 Units   PTA Medications  Medication Sig   ARIPiprazole  (ABILIFY ) 5 MG tablet Take 1 tablet (5 mg total) by mouth daily.   mirtazapine  (REMERON ) 15 MG tablet Take 1 tablet (15 mg total) by mouth at bedtime.   nicotine  (NICODERM CQ  - DOSED IN MG/24 HOURS) 21 mg/24hr patch Place 1 patch (21 mg total) onto the skin daily at 6 (six) AM.   [START ON 11/14/2024] Vitamin D , Ergocalciferol , (DRISDOL ) 1.25 MG (50000 UNIT) CAPS capsule Take 1 capsule (50,000 Units total) by  mouth every 7 (seven) days.   amLODipine  (NORVASC ) 10 MG tablet Take 1 tablet (10 mg total) by mouth at bedtime.   gabapentin  (NEURONTIN ) 300 MG capsule Take 1 capsule (300 mg total) by mouth 3 (three) times daily.   levothyroxine  (SYNTHROID ) 50 MCG tablet Take 1 tablet (50 mcg total) by mouth daily at 6 (six) AM.   losartan  (COZAAR ) 100 MG tablet Take 1 tablet (100 mg total) by mouth daily.   melatonin 3 MG TABS tablet Take 1 tablet (3 mg total) by mouth at bedtime as needed (sleep).   hydrOXYzine  (ATARAX ) 25 MG tablet Take 1 tablet (25 mg total) by mouth every 6 (six) hours as needed for anxiety.   naproxen  (NAPROSYN ) 375 MG tablet Take 1 tablet (375 mg total) by mouth 2 (two) times daily as needed.   nystatin -triamcinolone  (MYCOLOG II) cream Apply 1 Application topically 2 (two) times daily. Eczema    Long Term Goals: Improvement in symptoms so as ready for discharge  Short Term Goals: Patient will verbalize feelings in meetings with treatment team members., Patient will attend at least of 50% of the groups daily., Pt will complete the PHQ9 on admission, day 3 and  discharge., Patient will participate in completing the [place] Suicide Severity Rating Scale, Patient will score a low risk of violence for 24 hours prior to discharge, and Patient will take medications as prescribed daily.  Medical Decision Making  Treatment Plan Summary: Daily contact with patient to assess and evaluate symptoms and progress in treatment and Medication management  Safety and Monitoring: Voluntary admission to inpatient psychiatric unit for safety, stabilization and treatment Daily contact with patient to assess and evaluate symptoms and progress in treatment Patient's case to be discussed in multi-disciplinary team meeting Observation Level : q15 minute checks Vital signs: q12 hours Precautions: Safety  Diagnoses Principal Problem:   Cocaine use disorder, moderate, dependence (HCC)  Scheduled Medications  amLODipine   10 mg Oral QHS   ARIPiprazole   5 mg Oral Daily   [START ON 11/09/2024] cholecalciferol   2,000 Units Oral Daily   gabapentin   300 mg Oral TID   losartan   100 mg Oral q AM   melatonin  5 mg Oral QHS   mirtazapine   15 mg Oral QHS   [START ON 11/09/2024] nicotine   21 mg Transdermal Q0600    PRNS acetaminophen , alum & mag hydroxide-simeth, haloperidol  **AND** diphenhydrAMINE , hydrOXYzine , magnesium  hydroxide, traZODone   PRNS -Continue Tylenol  650 mg every 6 hours PRN for mild pain -Continue Maalox 30 mg every 4 hrs PRN for indigestion -Continue Milk of Magnesia as needed every 6 hrs for constipation  Discharge Planning: Social work and case management to assist with discharge planning and identification of hospital follow-up needs prior to discharge Estimated LOS: 5-7 days Discharge Concerns: Need to establish a safety plan; Medication compliance and effectiveness Discharge Goals: Return home with outpatient referrals for mental health follow-up including medication management/psychotherapy  I certify that inpatient services furnished can  reasonably be expected to improve the patient's condition.    Total Time Spent in Direct Patient Care:  I personally spent 75 minutes on the unit in direct patient care. The direct patient care time included face-to-face time with the patient, reviewing the patient's chart, communicating with other professionals, and coordinating care. Greater than 50% of this time was spent in counseling or coordinating care with the patient regarding goals of hospitalization, psycho-education, and discharge planning needs.   Donia Snell, NP 12/29/20257:47 PM  Donia Snell, NP 11/08/2024  7:47 PM

## 2024-11-08 NOTE — ED Notes (Signed)
 Patient is in the bedroom calm and sleeping. NAD. Environment secured, will keep monitoring for safety.

## 2024-11-08 NOTE — ED Notes (Signed)
 ADMISSION DAR NOTE:   Patient present back from [facility] related to elevated BP per report. Patient is alert and oriented x 3 Denies SI/HI/A/VH and verbally contracts for safety. Endorses anxiety.   Emotional support and availability offered to Patient as needed. Skin assessment done and belongings searched per protocol. Items deemed contraband secured in locker. Unit orientation and routine discussed, Care Plan reviewed as well and Patient verbalized understanding. Fluids and Food offered, tolerated well. Q15 minutes safety checks initiated without self harm gestures.

## 2024-11-08 NOTE — Group Note (Signed)
 Group Topic: Social Support  Group Date: 11/08/2024 Start Time: 1930 End Time: 2000 Facilitators: Verdon Jacqualyn BRAVO, NT  Department: [facility]  Number of Participants: 5  Group Focus: anger management Treatment Modality:  Individual Therapy Interventions utilized were group exercise Purpose: increase insight  Name: Marie Atkins Date of Birth: [birth year]  MR: [id number]    Level of Participation: active Quality of Participation: cooperative Interactions with others: gave feedback Mood/Affect: appropriate Triggers (if applicable): n/a Cognition: coherent/clear Progress: Moderate Response: n/a Plan: follow-up needed  Patients Problems:  Patient Active Problem List   Diagnosis Date Noted   Substance induced mood disorder (HCC) 11/08/2024   Episodic mood disorder 11/08/2024   Cocaine use disorder, moderate, dependence (HCC) 11/08/2024   GAD (generalized anxiety disorder) 07/15/2024   Cocaine use 07/15/2024   Suicidal ideation 07/15/2024   Vitamin D  deficiency 12/15/2023   Severe recurrent major depressive disorder with psychotic features (HCC) 08/10/2023   Hypothyroidism 03/27/2023   Uncomplicated alcohol dependence (HCC) 03/03/2023   Cocaine use disorder (HCC) 03/03/2023   Tobacco use disorder 03/03/2023   MDD (major depressive disorder), recurrent, severe, with psychosis (HCC) 03/02/2023   Severe recurrent major depression without psychotic features (HCC) 06/24/2020   Menorrhagia 06/16/2020   Iron deficiency anemia due to chronic blood loss 06/16/2020   Proteinuria 06/16/2020   Depression 06/16/2020   Tobacco dependence due to cigarettes 06/16/2020   ASCUS with positive high risk HPV cervical 06/16/2020   Amphetamine and psychostimulant-induced psychosis with delusions (HCC)    Benign essential HTN 02/16/2016   History of bilateral tubal ligation 08/27/2014

## 2024-11-08 NOTE — Care Plan (Signed)
 Interdisciplinary Treatment and Diagnostic Plan Update  11/08/2024 Time of Session: 8:30am Marie Atkins MRN: [id number]  Diagnosis:  Final diagnoses:  Cocaine use disorder (HCC) [F14.10]  GAD (generalized anxiety disorder)  Substance induced mood disorder (HCC)  Episodic mood disorder     Current Medications:  Current Facility-Administered Medications  Medication Dose Route Frequency Provider Last Rate Last Admin   acetaminophen  (TYLENOL ) tablet 650 mg  650 mg Oral Q6H PRN Kapoor, Sahil, MD       alum & mag hydroxide-simeth (MAALOX/MYLANTA) 200-200-20 MG/5ML suspension 30 mL  30 mL Oral Q4H PRN Kapoor, Sahil, MD       amLODipine  (NORVASC ) tablet 10 mg  10 mg Oral QHS Nkwenti, Doris, NP   10 mg at 11/07/24 2100   ARIPiprazole  (ABILIFY ) tablet 5 mg  5 mg Oral Daily Paliy, Alisa, MD   5 mg at 11/07/24 9167   cloNIDine  (CATAPRES ) tablet 0.1 mg  0.1 mg Oral Q12H PRN Paliy, Alisa, MD   0.1 mg at 11/06/24 1225   haloperidol  (HALDOL ) tablet 5 mg  5 mg Oral TID PRN Kapoor, Sahil, MD       And   diphenhydrAMINE  (BENADRYL ) capsule 50 mg  50 mg Oral TID PRN Kapoor, Sahil, MD       haloperidol  lactate (HALDOL ) injection 5 mg  5 mg Intramuscular TID PRN Kapoor, Sahil, MD       And   diphenhydrAMINE  (BENADRYL ) injection 50 mg  50 mg Intramuscular TID PRN Kapoor, Sahil, MD       And   LORazepam  (ATIVAN ) injection 2 mg  2 mg Intramuscular TID PRN Kapoor, Sahil, MD       haloperidol  lactate (HALDOL ) injection 10 mg  10 mg Intramuscular TID PRN Kapoor, Sahil, MD       And   diphenhydrAMINE  (BENADRYL ) injection 50 mg  50 mg Intramuscular TID PRN Kapoor, Sahil, MD       And   LORazepam  (ATIVAN ) injection 2 mg  2 mg Intramuscular TID PRN Kapoor, Sahil, MD       gabapentin  (NEURONTIN ) capsule 300 mg  300 mg Oral TID Paliy, Alisa, MD   300 mg at 11/07/24 2109   hydrOXYzine  (ATARAX ) tablet 25 mg  25 mg Oral Q6H PRN Paliy, Alisa, MD   25 mg at 11/06/24 2109   levothyroxine  (SYNTHROID ) tablet 50 mcg  50  mcg Oral Daily Paliy, Alisa, MD   50 mcg at 11/07/24 9167   losartan  (COZAAR ) tablet 100 mg  100 mg Oral Daily Paliy, Alisa, MD   100 mg at 11/07/24 0831   magnesium  hydroxide (MILK OF MAGNESIA) suspension 30 mL  30 mL Oral Daily PRN Kapoor, Sahil, MD   30 mL at 11/05/24 2110   melatonin tablet 5 mg  5 mg Oral QHS Nkwenti, Donia, NP   5 mg at 11/07/24 2109   mirtazapine  (REMERON ) tablet 15 mg  15 mg Oral QHS [place] Donia, NP   15 mg at 11/07/24 2109   naproxen  (NAPROSYN ) tablet 375 mg  375 mg Oral BID PRN Gottfried, Rhoda J, MD       nicotine  (NICODERM CQ  - dosed in mg/24 hours) patch 21 mg  21 mg Transdermal Q0600 Kapoor, Sahil, MD       nystatin -triamcinolone  (MYCOLOG II) cream 1 Application  1 Application Topical BID Paliy, Alisa, MD   1 Application at 11/05/24 1638   Vitamin D  (Ergocalciferol ) (DRISDOL ) 1.25 MG (50000 UNIT) capsule 50,000 Units  50,000 Units Oral Q7 days [place] Donia,  NP   50,000 Units at 11/07/24 1208   white petrolatum  (VASELINE) gel   Topical PRN Gottfried, Rhoda J, MD   1 Application at 11/06/24 1633   Current Outpatient Medications  Medication Sig Dispense Refill   amLODipine  (NORVASC ) 10 MG tablet Take 1 tablet (10 mg total) by mouth at bedtime. 30 tablet 0   ARIPiprazole  (ABILIFY ) 5 MG tablet Take 1 tablet (5 mg total) by mouth daily. 30 tablet 0   gabapentin  (NEURONTIN ) 300 MG capsule Take 1 capsule (300 mg total) by mouth 3 (three) times daily. 90 capsule 0   hydrOXYzine  (ATARAX ) 25 MG tablet Take 1 tablet (25 mg total) by mouth every 6 (six) hours as needed for anxiety. 30 tablet 0   levothyroxine  (SYNTHROID ) 50 MCG tablet Take 1 tablet (50 mcg total) by mouth daily at 6 (six) AM. 30 tablet 0   losartan  (COZAAR ) 100 MG tablet Take 1 tablet (100 mg total) by mouth daily. 30 tablet 0   melatonin 3 MG TABS tablet Take 1 tablet (3 mg total) by mouth at bedtime as needed (sleep). 30 tablet 0   mirtazapine  (REMERON ) 15 MG tablet Take 1 tablet (15 mg total) by mouth at  bedtime. 30 tablet 0   naproxen  (NAPROSYN ) 375 MG tablet Take 1 tablet (375 mg total) by mouth 2 (two) times daily as needed. 30 tablet 0   nicotine  (NICODERM CQ  - DOSED IN MG/24 HOURS) 21 mg/24hr patch Place 1 patch (21 mg total) onto the skin daily at 6 (six) AM. 28 patch 0   nystatin -triamcinolone  (MYCOLOG II) cream Apply 1 Application topically 2 (two) times daily. Eczema 60 g 0   [START ON 11/14/2024] Vitamin D , Ergocalciferol , (DRISDOL ) 1.25 MG (50000 UNIT) CAPS capsule Take 1 capsule (50,000 Units total) by mouth every 7 (seven) days. 4 capsule 0   PTA Medications: Prior to Admission medications  Medication Sig Start Date End Date Taking? Authorizing Provider  amLODipine  (NORVASC ) 10 MG tablet Take 1 tablet (10 mg total) by mouth at bedtime. 11/07/24   Tex Drilling, NP  ARIPiprazole  (ABILIFY ) 5 MG tablet Take 1 tablet (5 mg total) by mouth daily. 11/08/24   Tex Drilling, NP  gabapentin  (NEURONTIN ) 300 MG capsule Take 1 capsule (300 mg total) by mouth 3 (three) times daily. 11/07/24   Tex Drilling, NP  hydrOXYzine  (ATARAX ) 25 MG tablet Take 1 tablet (25 mg total) by mouth every 6 (six) hours as needed for anxiety. 11/07/24   Tex Drilling, NP  levothyroxine  (SYNTHROID ) 50 MCG tablet Take 1 tablet (50 mcg total) by mouth daily at 6 (six) AM. 11/07/24   Tex Drilling, NP  losartan  (COZAAR ) 100 MG tablet Take 1 tablet (100 mg total) by mouth daily. 11/07/24   Tex Drilling, NP  melatonin 3 MG TABS tablet Take 1 tablet (3 mg total) by mouth at bedtime as needed (sleep). 11/07/24   Tex Drilling, NP  mirtazapine  (REMERON ) 15 MG tablet Take 1 tablet (15 mg total) by mouth at bedtime. 11/07/24   Tex Drilling, NP  naproxen  (NAPROSYN ) 375 MG tablet Take 1 tablet (375 mg total) by mouth 2 (two) times daily as needed. 11/07/24   Tex Drilling, NP  nicotine  (NICODERM CQ  - DOSED IN MG/24 HOURS) 21 mg/24hr patch Place 1 patch (21 mg total) onto the skin daily at 6 (six) AM. 11/08/24   Tex Drilling, NP  nystatin -triamcinolone  (MYCOLOG II) cream Apply 1 Application topically 2 (two) times daily. Eczema 11/07/24   Tex Drilling, NP  Vitamin D , Ergocalciferol , (DRISDOL ) 1.25 MG (50000 UNIT) CAPS capsule Take 1 capsule (50,000 Units total) by mouth every 7 (seven) days. 11/14/24   Tex Drilling, NP    Patient Stressors: Substance abuse    Patient Strengths: Ability for insight  Motivation for treatment/growth  Supportive family/friends   Treatment Modalities: Medication Management, Group therapy, Case management,  1 to 1 session with clinician, Psychoeducation, Recreational therapy.   Physician Treatment Plan for Primary and Secondary Diagnosis:  Final diagnoses:  Cocaine use disorder (HCC) [F14.10]  GAD (generalized anxiety disorder)  Substance induced mood disorder (HCC)  Episodic mood disorder   Long Term Goal(s):    Short Term Goals:    Medication Management: Evaluate patient's response, side effects, and tolerance of medication regimen.  Therapeutic Interventions: 1 to 1 sessions, Unit Group sessions and Medication administration.  Evaluation of Outcomes: Adequate for Discharge  LCSW Treatment Plan for Primary Diagnosis:  Final diagnoses:  Cocaine use disorder (HCC) [F14.10]  GAD (generalized anxiety disorder)  Substance induced mood disorder (HCC)  Episodic mood disorder    Long Term Goal(s): Safe transition to appropriate next level of care at discharge.  Short Term Goals: Facilitate acceptance of mental health diagnosis and concerns through verbal commitment to aftercare plan and appointments at discharge.  Therapeutic Interventions: Assess for all discharge needs, 1 to 1 time with Child psychotherapist, Explore available resources and support systems, Assess for adequacy in community support network, Educate family and significant other(s) on suicide prevention, Complete Psychosocial Assessment, Interpersonal group therapy.  Evaluation of Outcomes: Adequate for  Discharge   Progress in Treatment: Attending groups: Yes. Participating in groups: Yes. Taking medication as prescribed: Yes. Toleration medication: No. Family/Significant other contact made: No, client reports she did not wish to contact her family right now so she can work on herself.  Client reported she will contact them once she's at [facility] on 11/04/24. Patient understands diagnosis: Yes. Discussing patient identified problems/goals with staff: Yes. Medical problems stabilized or resolved: Yes. Denies suicidal/homicidal ideation: Yes. Issues/concerns per patient self-inventory: No. Other: NA  New problem(s) identified:   New Short Term/Long Term Goal(s): Client will leave [facility] today to go to [facility].  Client will complete door to door transfer to avoid relapse.    Patient Goals:  Client will gain sobriety by entering into treatment at [facility].  Client will complete a minimum of 30 days.    Discharge Plan or Barriers: Client reports no discharge barriers.  Client was informed her boyfriend can bring any clothing items needed to [facility].   Reason for Continuation of Hospitalization: Other; describe Client was observed for blood pressure concerns, stabilization, and to complete [facility].   Estimated Length of Stay:11/03/24 -11/08/24  Last 3 [place] Suicide Severity Risk Score: Flowsheet Row ED from 11/03/2024 in [facility] Most recent reading at 11/03/2024 11:55 PM ED from 11/03/2024 in [facility] Most recent reading at 11/03/2024 12:43 PM ED from 07/15/2024 in [facility] Most recent reading at 07/15/2024  6:40 AM  C-SSRS RISK CATEGORY Moderate Risk No Risk High Risk    Last PHQ 2/9 Scores:    11/06/2024   10:58 AM 11/03/2024    1:54 PM 07/15/2024    5:25 AM  Depression screen PHQ 2/9  Decreased Interest 0 3 3  Down, Depressed, Hopeless 0 3 3  PHQ - 2 Score 0 6 6   Altered sleeping 0 3 3  Tired, decreased  energy 0 3 3  Change in appetite 0 3 3  Feeling bad or failure about yourself  0 3 3  Trouble concentrating 0 3 3  Moving slowly or fidgety/restless 0 0 3  Suicidal thoughts 0 2 3  PHQ-9 Score 0 23 27   Difficult doing work/chores Not difficult at all  Extremely dIfficult     Data saved with a previous flowsheet row definition    Scribe for Treatment Team: Makinzey Banes, LCSW 11/08/2024 8:55 AM

## 2024-11-08 NOTE — ED Notes (Signed)
 Pt sleeping in bed, no acute distress noted. Respirations even and unlabored. Continue to monitor for safety.

## 2024-11-08 NOTE — Group Note (Signed)
 Group Topic: Communication  Group Date: 11/08/2024 Start Time: 1300 End Time: 1400 Facilitators: Amabel Stmarie, Camellia HERO, NT  Department: [facility]  Number of Participants: 3  Group Focus: goals/reality orientation Treatment Modality:  Psychoeducation Interventions utilized were support Purpose: express feelings  Name: Marie Atkins Date of Birth: Jul 16, 1971  MR: [id number]    Level of Participation: active Quality of Participation: cooperative Interactions with others: gave feedback Mood/Affect: appropriate Triggers (if applicable):  Cognition: coherent/clear Progress: Significant   Patients Problems:  Patient Active Problem List   Diagnosis Date Noted   Substance induced mood disorder (HCC) 11/08/2024   Episodic mood disorder 11/08/2024   Cocaine use disorder, moderate, dependence (HCC) 11/08/2024   GAD (generalized anxiety disorder) 07/15/2024   Cocaine use 07/15/2024   Suicidal ideation 07/15/2024   Vitamin D  deficiency 12/15/2023   Severe recurrent major depressive disorder with psychotic features (HCC) 08/10/2023   Hypothyroidism 03/27/2023   Uncomplicated alcohol dependence (HCC) 03/03/2023   Cocaine use disorder (HCC) 03/03/2023   Tobacco use disorder 03/03/2023   MDD (major depressive disorder), recurrent, severe, with psychosis (HCC) 03/02/2023   Severe recurrent major depression without psychotic features (HCC) 06/24/2020   Menorrhagia 06/16/2020   Iron deficiency anemia due to chronic blood loss 06/16/2020   Proteinuria 06/16/2020   Depression 06/16/2020   Tobacco dependence due to cigarettes 06/16/2020   ASCUS with positive high risk HPV cervical 06/16/2020   Amphetamine and psychostimulant-induced psychosis with delusions (HCC)    Benign essential HTN 02/16/2016   History of bilateral tubal ligation 08/27/2014

## 2024-11-08 NOTE — ED Notes (Signed)
 Patient is in the dayroom calm and composed, watching TV with other patients. Denies SI/HI/AVH.  Environment secured per policy.  Respirations even and unlabored. Will monitor for safety.

## 2024-11-08 NOTE — ED Notes (Signed)
 Patient denies pain and is resting comfortably.

## 2024-11-08 NOTE — ED Notes (Signed)
 Pt denies SI/HI/AVH. No reports of pain. Compliant with scheduled meds. She was calm, cooperative, and pleasant. No behavioral concerns at this time. Pt sleeping in bed, no acute distress noted. Respirations even and unlabored. Continue to monitor for safety.

## 2024-11-09 DIAGNOSIS — F1094 Alcohol use, unspecified with alcohol-induced mood disorder: Secondary | ICD-10-CM | POA: Diagnosis not present

## 2024-11-09 DIAGNOSIS — F17210 Nicotine dependence, cigarettes, uncomplicated: Secondary | ICD-10-CM | POA: Diagnosis not present

## 2024-11-09 DIAGNOSIS — F411 Generalized anxiety disorder: Secondary | ICD-10-CM | POA: Diagnosis not present

## 2024-11-09 MED ORDER — AMLODIPINE BESYLATE 10 MG PO TABS: 10.0000 mg | ORAL_TABLET | Freq: Every day | ORAL | Status: AC

## 2024-11-09 MED ORDER — WHITE PETROLATUM EX OINT
TOPICAL_OINTMENT | CUTANEOUS | Status: AC
  Administered 2024-11-09: 1 via TOPICAL
  Filled 2024-11-09: qty 5

## 2024-11-09 MED ORDER — TRAZODONE HCL 50 MG PO TABS: 50.0000 mg | ORAL_TABLET | Freq: Every evening | ORAL | Status: AC | PRN

## 2024-11-09 MED ORDER — HYDROXYZINE HCL 50 MG PO TABS: 50.0000 mg | ORAL_TABLET | Freq: Three times a day (TID) | ORAL | Status: AC | PRN

## 2024-11-09 MED ORDER — VITAMIN D3 25 MCG PO TABS: 2000.0000 [IU] | ORAL_TABLET | Freq: Every day | ORAL | Status: AC

## 2024-11-09 MED ORDER — IBUPROFEN 600 MG PO TABS
600.0000 mg | ORAL_TABLET | Freq: Once | ORAL | Status: AC
  Administered 2024-11-09: 600 mg via ORAL
  Filled 2024-11-09: qty 1

## 2024-11-09 MED ORDER — MELATONIN 5 MG PO TABS: 5.0000 mg | ORAL_TABLET | Freq: Every day | ORAL | Status: AC

## 2024-11-09 MED ORDER — LOSARTAN POTASSIUM 100 MG PO TABS: 100.0000 mg | ORAL_TABLET | Freq: Every morning | ORAL | Status: AC

## 2024-11-09 MED ORDER — WHITE PETROLATUM EX OINT: 1.0000 | TOPICAL_OINTMENT | CUTANEOUS | 0 refills | Status: AC | PRN

## 2024-11-09 MED ORDER — WHITE PETROLATUM EX OINT: TOPICAL_OINTMENT | CUTANEOUS | Status: DC | PRN

## 2024-11-09 NOTE — Discharge Instructions (Addendum)
 [facility] Care Management...  [facility] [address] [place], [place] [address]  [phone]  [facility] will arrange transportation from [facility] to [facility] in [place], [place].  Client will be transported with medication and scripts to [facility].

## 2024-11-09 NOTE — ED Notes (Signed)
 Patient is in the bedroom sleeping, NAD.

## 2024-11-09 NOTE — Group Note (Signed)
 Group Topic: Understanding Self  Group Date: 11/09/2024 Start Time: 1200 End Time: 1225 Facilitators: Daved Tinnie HERO, RN  Department: [facility]  Number of Participants: 7  Group Focus: nursing group Treatment Modality:  Psychoeducation Interventions utilized were patient education Purpose: increase insight  Name: Marie Atkins Date of Birth: 12-18-1970  MR: [id number]    Level of Participation: active Quality of Participation: cooperative and engaged Interactions with others: gave feedback Mood/Affect: appropriate Triggers (if applicable): n/a Cognition: coherent/clear Progress: Gaining insight Response: pt says they will attend meetings, establish a supportive network  Plan: patient will be encouraged to attend future RN education groups  Patients Problems:  Patient Active Problem List   Diagnosis Date Noted   Substance induced mood disorder (HCC) 11/08/2024   Episodic mood disorder 11/08/2024   Cocaine use disorder, moderate, dependence (HCC) 11/08/2024   GAD (generalized anxiety disorder) 07/15/2024   Cocaine use 07/15/2024   Suicidal ideation 07/15/2024   Vitamin D  deficiency 12/15/2023   Severe recurrent major depressive disorder with psychotic features (HCC) 08/10/2023   Hypothyroidism 03/27/2023   Uncomplicated alcohol dependence (HCC) 03/03/2023   Cocaine use disorder (HCC) 03/03/2023   Tobacco use disorder 03/03/2023   MDD (major depressive disorder), recurrent, severe, with psychosis (HCC) 03/02/2023   Severe recurrent major depression without psychotic features (HCC) 06/24/2020   Menorrhagia 06/16/2020   Iron deficiency anemia due to chronic blood loss 06/16/2020   Proteinuria 06/16/2020   Depression 06/16/2020   Tobacco dependence due to cigarettes 06/16/2020   ASCUS with positive high risk HPV cervical 06/16/2020   Amphetamine and psychostimulant-induced psychosis with delusions (HCC)    Benign essential HTN 02/16/2016    History of bilateral tubal ligation 08/27/2014

## 2024-11-09 NOTE — ED Notes (Signed)
 Pt says that stretching was effective in helping to relieve torso cramp, continues to report minor-moderate hip pain. Provider notified, pt administered prn advil . Pt ate lunch, continues to calm and cooperative with positive affect.

## 2024-11-09 NOTE — Care Management (Signed)
" °  FBC Care Management...   Per Rosaline at [facility], the client will need to be stabilized on her blood pressure medication for at least 3-4 days before she can be reconsidered for acceptance.    Writer met with the client to explore other residential options.  Writer asked about [facility] and the client immediately responded no, I've heard bad things.  Client reports she wants a working program on a farm.  Client reports she believes the agency is called [facility].    Writer informed the client she will google and try to locate the agency and their programs information.   Writer re-iterated to the client the average stay at [facility] is 3-5 days and we will need to select an agency within that time frame.       UPDATE: 5:42pm 11/08/24 Writer called [facility] in [place], [place].  Writer left a HIPAA compliant VM on the client's behalf requesting a returned phone call.    Writer will call the agency again tomorrow.   "

## 2024-11-09 NOTE — Group Note (Signed)
 Group Topic: Relapse and Recovery  Group Date: 11/09/2024 Start Time: 1100 End Time: 1150 Facilitators: Gerome Jolly, NT  Department: [facility]  Number of Participants: 4  Group Focus: chemical dependency education Treatment Modality:  Cognitive Behavioral Therapy and Spiritual Interventions utilized were exploration, problem solving, story telling, and support Purpose: explore maladaptive thinking, express irrational fears, increase insight, and relapse prevention strategies  Name: Marie Atkins Date of Birth: Feb 10, 1971  MR: [id number]    Level of Participation: active Quality of Participation: attentive and engaged Interactions with others: gave feedback Mood/Affect: anxious and appropriate Triggers (if applicable): NA Cognition: fearful and guilty Progress: Significant Response: Patient identified having a fear of change and the uncertainty it can bring. Patient reported looking forward to getting her life back   Plan: referral / recommendations patient has been accepted to [facility] [place] for residential treatment  Patients Problems:  Patient Active Problem List   Diagnosis Date Noted   Substance induced mood disorder (HCC) 11/08/2024   Episodic mood disorder 11/08/2024   Cocaine use disorder, moderate, dependence (HCC) 11/08/2024   GAD (generalized anxiety disorder) 07/15/2024   Cocaine use 07/15/2024   Suicidal ideation 07/15/2024   Vitamin D  deficiency 12/15/2023   Severe recurrent major depressive disorder with psychotic features (HCC) 08/10/2023   Hypothyroidism 03/27/2023   Uncomplicated alcohol dependence (HCC) 03/03/2023   Cocaine use disorder (HCC) 03/03/2023   Tobacco use disorder 03/03/2023   MDD (major depressive disorder), recurrent, severe, with psychosis (HCC) 03/02/2023   Severe recurrent major depression without psychotic features (HCC) 06/24/2020   Menorrhagia 06/16/2020   Iron deficiency anemia due to chronic  blood loss 06/16/2020   Proteinuria 06/16/2020   Depression 06/16/2020   Tobacco dependence due to cigarettes 06/16/2020   ASCUS with positive high risk HPV cervical 06/16/2020   Amphetamine and psychostimulant-induced psychosis with delusions (HCC)    Benign essential HTN 02/16/2016   History of bilateral tubal ligation 08/27/2014
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
## 2024-11-09 NOTE — ED Notes (Signed)
 Discharge paperwork discussed with pt, questions denies. Pt escorted off unit by staff with medication samples and prescriptions. Items returned.

## 2024-11-09 NOTE — Care Management (Signed)
 FBC Care Management...  Writer received a call from [facility].  They report the client has been accepted into their facility.  They confirmed no issues regarding her blood pressure and or medication(s).  They report a driver will pick her up today from [facility] and will transport her to [facility].

## 2024-11-09 NOTE — Care Management (Signed)
 FBC Care Management....  Client has been accepted into [facility] after completing the phone interview.  [facility] will transport the client to their facility this afternoon.  Client will  attend the [facility] after she finishes residential treatment at [facility].    [facility] driver will call this afternoon with an ETA when they are an hour away.  Client will be discharged with scripts and 7 day supply.

## 2024-11-09 NOTE — ED Notes (Signed)
 Pt presents as calm and cooperative. Pt says she feels 'good'. Pt reports chronic hip pain, level 4/10 pain. Pt spoke to provider about it. Pt declines tylenol  at this time. Pt denies si hi and avh, verbal contract for safety provided. Medication reviewed, questions denied. Pt reports to have eaten breakfast.

## 2024-11-09 NOTE — Care Management (Signed)
 FBC Care Management...  Writer spoke with the client regarding updates.  She reports [facility] spoke with [facility] and they informed her she must first complete a minimum of 30 days in residential treatment.   Client reports she will like a referral to be sent to [facility] [place].  Writer informed the client since she was recently accepted she could leave as early as today.  Client reports she would love that.    Writer faxed a referral to [facility].

## 2024-12-09 ENCOUNTER — Ambulatory Visit: Payer: Self-pay

## 2024-12-09 NOTE — Telephone Encounter (Signed)
 FYI Only or Action Required?: FYI only for provider: appointment scheduled on 2/5.  Patient was last seen in primary care on 01/12/2024 by Marie Atkins, Marie RAMAN, Marie Atkins.  Called Nurse Triage reporting Hypertension.  Symptoms began several weeks ago.  Interventions attempted: Prescription medications: Amlodipine , Losartan .  Symptoms are: unchanged.  Triage Disposition: See PCP When Office is Open (Within 3 Days)  Patient/caregiver understands and will follow disposition?: Yes       Message from Alfonso ORN sent at 12/09/2024 11:59 AM EST  Reason for Triage: pt experiencing high bp and discharge for past couple weeks , worsening  [facility] to establish care   Reason for Disposition  Systolic BP >= 160 OR Diastolic >= 100  Answer Assessment - Initial Assessment Questions 1. BLOOD PRESSURE: What is your blood pressure? Did you take at least two measurements 5 minutes apart?     140/85 per the patient the highest B/P 191/111 x 3 weeks ago  2. ONSET: When did you take your blood pressure?     Several days ago  3. HOW: How did you take your blood pressure? (e.g., automatic home BP monitor, visiting nurse)     Automatic cuff  4. HISTORY: Do you have a history of high blood pressure?     HTN  5. MEDICINES: Are you taking any medicines for blood pressure? Have you missed any doses recently?     Amlodipine , Losartan .   6. OTHER SYMPTOMS: Do you have any symptoms? (e.g., blurred vision, chest pain, difficulty breathing, headache, weakness)   Seeing spots in eyes.      Patient called in to triage with complaints of HTN. This has been ongoing for 2 weeks.  The patient stated her B/P has been increasing over the past month.    New Patient Appointment scheduled for further evaluation at [facility]; per patients request. Patient agrees with the plan of care, and will reach out if symptoms worsen or persist.  Protocols used: Blood Pressure  - High-A-AH

## 2024-12-15 ENCOUNTER — Telehealth: Payer: Self-pay | Admitting: *Deleted

## 2024-12-15 NOTE — Telephone Encounter (Signed)
 12/15/24 Called and confirmed appointment

## 2024-12-16 ENCOUNTER — Ambulatory Visit: Payer: Self-pay | Admitting: *Deleted

## 2024-12-16 ENCOUNTER — Encounter: Payer: Self-pay | Admitting: *Deleted

## 2024-12-16 VITALS — BP 139/85 | HR 90 | Temp 98.4°F | Ht 63.0 in | Wt 228.2 lb

## 2024-12-16 DIAGNOSIS — R0602 Shortness of breath: Secondary | ICD-10-CM

## 2024-12-16 DIAGNOSIS — F3289 Other specified depressive episodes: Secondary | ICD-10-CM

## 2024-12-16 DIAGNOSIS — N898 Other specified noninflammatory disorders of vagina: Secondary | ICD-10-CM

## 2024-12-16 DIAGNOSIS — G8929 Other chronic pain: Secondary | ICD-10-CM

## 2024-12-16 MED ORDER — ALBUTEROL SULFATE HFA 108 (90 BASE) MCG/ACT IN AERS: 2.0000 | INHALATION_SPRAY | Freq: Four times a day (QID) | RESPIRATORY_TRACT | 0 refills | Status: AC | PRN

## 2024-12-16 MED ORDER — MIRTAZAPINE 15 MG PO TABS: 15.0000 mg | ORAL_TABLET | Freq: Every day | ORAL | 0 refills | Status: AC

## 2024-12-16 MED ORDER — NAPROXEN 375 MG PO TABS: 375.0000 mg | ORAL_TABLET | Freq: Two times a day (BID) | ORAL | 0 refills | Status: AC | PRN

## 2024-12-16 NOTE — Assessment & Plan Note (Addendum)
 As above she asked for refills of medication Remeron  being one of them. Okay to refill this. As above she will go to AA for group meetings and get a sponsor. She will notify us  if she wants to see a therapist Orders:   mirtazapine  (REMERON ) 15 MG tablet; Take 1 tablet (15 mg total) by mouth at bedtime.
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
## 2024-12-16 NOTE — Patient Instructions (Addendum)
 Today we discussed your blood pressure that was stable at check-in at 139/85. Continue to adhere to a heart healthy diet, exercise daily and take your medications as prescribed  We also discussed some vaginal discharge that you are concerned about that is been there for 2 weeks with question about STI. You performed a self swab and we will notify you of the results when they are available. He also have a flare of low back pain that radiates to both hips and down to the left knee and ankle. This is chronic recurring and usually gets better when you use ibuprofen  which we will refill today. Also encouraged you to use heat or ice whichever is more comforting. Congratulations on your recent admission for treatment for substance use. Happy to hear that you will be attending AA and get a sponsor.  He also discussed having trouble with shortness of breath at at bedtime. You do have history of smoking. Okay to try an inhaler at at bedtime to see if that helps. Let the clinic know if you continue to have persistent problems with this  I have sent the case manager a message that you need help with housing. I will send in a dental referral. There is also a list of dentist that you will receive before leaving the clinic
